# Patient Record
Sex: Female | Born: 1960 | Race: Black or African American | Hispanic: No | Marital: Married | Smoking: Former smoker
Health system: Southern US, Community
[De-identification: ages and names within clinical notes are randomized; demographics above are authoritative.]

## PROBLEM LIST (undated history)

## (undated) DIAGNOSIS — M1990 Unspecified osteoarthritis, unspecified site: Secondary | ICD-10-CM

## (undated) DIAGNOSIS — C50919 Malignant neoplasm of unspecified site of unspecified female breast: Secondary | ICD-10-CM

## (undated) DIAGNOSIS — E119 Type 2 diabetes mellitus without complications: Secondary | ICD-10-CM

## (undated) DIAGNOSIS — Z923 Personal history of irradiation: Secondary | ICD-10-CM

## (undated) DIAGNOSIS — Z9221 Personal history of antineoplastic chemotherapy: Secondary | ICD-10-CM

## (undated) DIAGNOSIS — G709 Myoneural disorder, unspecified: Secondary | ICD-10-CM

## (undated) DIAGNOSIS — I10 Essential (primary) hypertension: Secondary | ICD-10-CM

## (undated) DIAGNOSIS — F32A Depression, unspecified: Secondary | ICD-10-CM

## (undated) DIAGNOSIS — J189 Pneumonia, unspecified organism: Secondary | ICD-10-CM

## (undated) DIAGNOSIS — F419 Anxiety disorder, unspecified: Secondary | ICD-10-CM

---

## 2003-12-19 ENCOUNTER — Emergency Department (HOSPITAL_COMMUNITY): Admission: EM | Admit: 2003-12-19 | Discharge: 2003-12-19 | Payer: Self-pay | Admitting: Emergency Medicine

## 2009-09-07 ENCOUNTER — Emergency Department (HOSPITAL_COMMUNITY): Admission: EM | Admit: 2009-09-07 | Discharge: 2009-09-07 | Payer: Self-pay | Admitting: Family Medicine

## 2011-07-31 ENCOUNTER — Emergency Department (HOSPITAL_COMMUNITY)
Admission: EM | Admit: 2011-07-31 | Discharge: 2011-07-31 | Disposition: A | Discharge status: Home / Self Care | Payer: 59 | Attending: Emergency Medicine | Admitting: Emergency Medicine

## 2011-07-31 DIAGNOSIS — J3489 Other specified disorders of nose and nasal sinuses: Secondary | ICD-10-CM | POA: Insufficient documentation

## 2011-07-31 DIAGNOSIS — Z79899 Other long term (current) drug therapy: Secondary | ICD-10-CM | POA: Insufficient documentation

## 2011-07-31 DIAGNOSIS — R112 Nausea with vomiting, unspecified: Secondary | ICD-10-CM | POA: Insufficient documentation

## 2011-07-31 DIAGNOSIS — I10 Essential (primary) hypertension: Secondary | ICD-10-CM | POA: Insufficient documentation

## 2011-07-31 DIAGNOSIS — J309 Allergic rhinitis, unspecified: Secondary | ICD-10-CM | POA: Insufficient documentation

## 2011-07-31 DIAGNOSIS — R42 Dizziness and giddiness: Secondary | ICD-10-CM | POA: Insufficient documentation

## 2011-07-31 DIAGNOSIS — E119 Type 2 diabetes mellitus without complications: Secondary | ICD-10-CM | POA: Insufficient documentation

## 2011-07-31 LAB — GLUCOSE, CAPILLARY: Glucose-Capillary: 197 mg/dL — ABNORMAL HIGH (ref 70–99)

## 2015-06-20 ENCOUNTER — Encounter (HOSPITAL_COMMUNITY): Payer: Self-pay | Admitting: Emergency Medicine

## 2015-06-20 ENCOUNTER — Emergency Department (HOSPITAL_COMMUNITY)
Admission: EM | Admit: 2015-06-20 | Discharge: 2015-06-20 | Disposition: A | Discharge status: Home / Self Care | Payer: Managed Care, Other (non HMO) | Attending: Emergency Medicine | Admitting: Emergency Medicine

## 2015-06-20 DIAGNOSIS — Z8659 Personal history of other mental and behavioral disorders: Secondary | ICD-10-CM | POA: Diagnosis not present

## 2015-06-20 DIAGNOSIS — H9201 Otalgia, right ear: Secondary | ICD-10-CM

## 2015-06-20 DIAGNOSIS — E119 Type 2 diabetes mellitus without complications: Secondary | ICD-10-CM | POA: Diagnosis not present

## 2015-06-20 DIAGNOSIS — I10 Essential (primary) hypertension: Secondary | ICD-10-CM | POA: Diagnosis not present

## 2015-06-20 DIAGNOSIS — R42 Dizziness and giddiness: Secondary | ICD-10-CM

## 2015-06-20 DIAGNOSIS — Z720 Tobacco use: Secondary | ICD-10-CM | POA: Diagnosis not present

## 2015-06-20 HISTORY — DX: Anxiety disorder, unspecified: F41.9

## 2015-06-20 HISTORY — DX: Type 2 diabetes mellitus without complications: E11.9

## 2015-06-20 HISTORY — DX: Essential (primary) hypertension: I10

## 2015-06-20 MED ORDER — PSEUDOEPHEDRINE HCL 30 MG PO TABS: 30.0000 mg | ORAL_TABLET | Freq: Four times a day (QID) | ORAL | Status: DC | PRN

## 2015-06-20 MED ORDER — MECLIZINE HCL 12.5 MG PO TABS
25.0000 mg | ORAL_TABLET | Freq: Once | ORAL | Status: AC
  Administered 2015-06-20: 25 mg via ORAL
  Filled 2015-06-20: qty 2

## 2015-06-20 MED ORDER — PSEUDOEPHEDRINE HCL 60 MG PO TABS
30.0000 mg | ORAL_TABLET | Freq: Once | ORAL | Status: AC
  Administered 2015-06-20: 60 mg via ORAL
  Filled 2015-06-20: qty 1

## 2015-06-20 MED ORDER — MECLIZINE HCL 25 MG PO TABS: 25.0000 mg | ORAL_TABLET | Freq: Three times a day (TID) | ORAL | Status: DC | PRN

## 2015-06-20 MED ORDER — AMOXICILLIN 250 MG PO CAPS: 250.0000 mg | ORAL_CAPSULE | Freq: Three times a day (TID) | ORAL | Status: DC

## 2015-06-20 NOTE — ED Notes (Signed)
Pt c/o pain to right ear since yesterday am.

## 2015-06-20 NOTE — ED Provider Notes (Signed)
CSN: [id number]     Arrival date & time 06/20/15  1721 History   First MD Initiated Contact with Patient 06/20/15 1730     Chief Complaint  Patient presents with  . Otalgia     (Consider location/radiation/quality/duration/timing/severity/associated sxs/prior Treatment) Patient is a 54 y.o. female presenting with ear pain. The history is provided by the patient.  Otalgia Location:  Right Quality:  Unable to specify Severity:  Moderate Onset quality:  Sudden Duration:  1 day Timing:  Constant Progression:  Worsening Chronicity:  New Relieved by:  None tried Worsened by:  Nothing tried Ineffective treatments:  None tried  Sydney Lee is a 54 y.o. female who presents to the ED with right ear pain that started yesterday. She describes the symptoms as a feeling that wind is blowing in her ear. She reports using a ear phone all day at work in the right ear. Hx of vertigo and has had some episodes recently. She takes Claritin daily.   Past Medical History  Diagnosis Date  . Hypertension   . Diabetes mellitus without complication   . Anxiety    History reviewed. No pertinent past surgical history. No family history on file. History  Substance Use Topics  . Smoking status: Current Every Day Smoker -- 1.00 packs/day    Types: Cigarettes  . Smokeless tobacco: Not on file  . Alcohol Use: No   OB History    No data available     Review of Systems Negative except as stated in HPI   Allergies  Codeine  Home Medications   Prior to Admission medications   Medication Sig Start Date End Date Taking? Authorizing Provider  amoxicillin (AMOXIL) 250 MG capsule Take 1 capsule (250 mg total) by mouth 3 (three) times daily. 06/20/15   Hope Bunnie Pion, NP  meclizine (ANTIVERT) 25 MG tablet Take 1 tablet (25 mg total) by mouth 3 (three) times daily as needed for dizziness. 06/20/15   Hope Bunnie Pion, NP  pseudoephedrine (SUDAFED) 30 MG tablet Take 1 tablet (30 mg total) by mouth every 6  (six) hours as needed for congestion. 06/20/15   Hope Bunnie Pion, NP   BP 112/71 mmHg  Pulse 82  Temp(Src) 98.2 F (36.8 C) (Oral)  Resp 22  Ht 5\' 9"  (1.753 m)  Wt 247 lb (112.038 kg)  BMI 36.46 kg/m2  SpO2 97% Physical Exam  Constitutional: She is oriented to person, place, and time. She appears well-developed and well-nourished. No distress.  HENT:  Head: Normocephalic and atraumatic.  Right Ear: Tympanic membrane normal.  Left Ear: Tympanic membrane normal.  Nose: Nose normal.  Mouth/Throat: Uvula is midline, oropharynx is clear and moist and mucous membranes are normal.  Eyes: Conjunctivae and EOM are normal.  Neck: Normal range of motion. Neck supple.  Cardiovascular: Normal rate.   Pulmonary/Chest: Effort normal.  Musculoskeletal: Normal range of motion.  Neurological: She is alert and oriented to person, place, and time. She has normal strength. No cranial nerve deficit. Gait normal.  Skin: Skin is warm and dry.  Psychiatric: She has a normal mood and affect. Her behavior is normal.  Nursing note and vitals reviewed.   ED Course  Procedures (including critical care time) Dr. Lacinda Axon in to examine the patient. He discussed with her eustachian tube dysfunction and possible early infection that is not visible at this time. Will start antibiotics, decongestants and Antivert. She will follow up with her doctor next week.    MDM  54 y.o.  female with right ear pain x 1 day. Stable for d/c without fever, no mastoid tenderness, no focal neuro deficits. Discussed with the patient clinical findings and plan of care. All questioned fully answered. She will follow up with her PCP or return here if any problems arise.   Final diagnoses:  Ear ache, right  Vertigo       Ashley Murrain, NP 06/20/15 7622  Nat Christen, MD 06/20/15 [phone]

## 2015-10-12 ENCOUNTER — Other Ambulatory Visit (HOSPITAL_COMMUNITY)
Admission: RE | Admit: 2015-10-12 | Discharge: 2015-10-12 | Disposition: A | Discharge status: Home / Self Care | Payer: Managed Care, Other (non HMO) | Source: Ambulatory Visit | Attending: Family Medicine | Admitting: Family Medicine

## 2015-10-12 DIAGNOSIS — E1165 Type 2 diabetes mellitus with hyperglycemia: Secondary | ICD-10-CM | POA: Diagnosis not present

## 2015-10-12 DIAGNOSIS — I10 Essential (primary) hypertension: Secondary | ICD-10-CM | POA: Insufficient documentation

## 2015-10-12 LAB — COMPREHENSIVE METABOLIC PANEL
ALT: 12 U/L — AB (ref 14–54)
AST: 15 U/L (ref 15–41)
Albumin: 3.9 g/dL (ref 3.5–5.0)
Alkaline Phosphatase: 66 U/L (ref 38–126)
Anion gap: 9 (ref 5–15)
BUN: 13 mg/dL (ref 6–20)
CHLORIDE: 102 mmol/L (ref 101–111)
CO2: 30 mmol/L (ref 22–32)
CREATININE: 0.86 mg/dL (ref 0.44–1.00)
Calcium: 9.6 mg/dL (ref 8.9–10.3)
GFR calc Af Amer: >60 mL/min (ref >60)
Glucose, Bld: 128 mg/dL — ABNORMAL HIGH (ref 65–99)
POTASSIUM: 3.4 mmol/L — AB (ref 3.5–5.1)
SODIUM: 141 mmol/L (ref 135–145)
Total Bilirubin: 0.7 mg/dL (ref 0.3–1.2)
Total Protein: 7.3 g/dL (ref 6.5–8.1)

## 2015-10-13 LAB — HEMOGLOBIN A1C
Hgb A1c MFr Bld: 7.4 % — ABNORMAL HIGH (ref 4.8–5.6)
MEAN PLASMA GLUCOSE: 166 mg/dL

## 2016-08-12 ENCOUNTER — Encounter (HOSPITAL_COMMUNITY): Payer: Self-pay

## 2016-08-12 ENCOUNTER — Emergency Department (HOSPITAL_COMMUNITY)
Admission: EM | Admit: 2016-08-12 | Discharge: 2016-08-12 | Disposition: A | Discharge status: Home / Self Care | Payer: Managed Care, Other (non HMO) | Attending: Dermatology | Admitting: Dermatology

## 2016-08-12 DIAGNOSIS — Z79899 Other long term (current) drug therapy: Secondary | ICD-10-CM | POA: Diagnosis not present

## 2016-08-12 DIAGNOSIS — Z5321 Procedure and treatment not carried out due to patient leaving prior to being seen by health care provider: Secondary | ICD-10-CM | POA: Insufficient documentation

## 2016-08-12 DIAGNOSIS — I10 Essential (primary) hypertension: Secondary | ICD-10-CM | POA: Insufficient documentation

## 2016-08-12 DIAGNOSIS — E119 Type 2 diabetes mellitus without complications: Secondary | ICD-10-CM | POA: Diagnosis not present

## 2016-08-12 DIAGNOSIS — F17210 Nicotine dependence, cigarettes, uncomplicated: Secondary | ICD-10-CM | POA: Diagnosis not present

## 2016-08-12 NOTE — ED Triage Notes (Signed)
Patient states she wants to go home, patient states her blood pressure is not as high as it was at home, and states "ill take my medication at home" patient ambulatory without deficit out of department at this time

## 2016-08-12 NOTE — ED Triage Notes (Signed)
Patient states she checked her blood pressure at home and it was elevated. Denies any other symptoms. States she has not had her blood pressure in two days. A&OX4 at this time

## 2017-03-08 ENCOUNTER — Emergency Department (HOSPITAL_COMMUNITY): Payer: BLUE CROSS/BLUE SHIELD

## 2017-03-08 ENCOUNTER — Encounter (HOSPITAL_COMMUNITY): Payer: Self-pay | Admitting: *Deleted

## 2017-03-08 ENCOUNTER — Emergency Department (HOSPITAL_COMMUNITY)
Admission: EM | Admit: 2017-03-08 | Discharge: 2017-03-08 | Disposition: A | Discharge status: Home / Self Care | Payer: BLUE CROSS/BLUE SHIELD | Attending: Emergency Medicine | Admitting: Emergency Medicine

## 2017-03-08 DIAGNOSIS — F17210 Nicotine dependence, cigarettes, uncomplicated: Secondary | ICD-10-CM | POA: Insufficient documentation

## 2017-03-08 DIAGNOSIS — B9789 Other viral agents as the cause of diseases classified elsewhere: Secondary | ICD-10-CM

## 2017-03-08 DIAGNOSIS — E119 Type 2 diabetes mellitus without complications: Secondary | ICD-10-CM | POA: Diagnosis not present

## 2017-03-08 DIAGNOSIS — J069 Acute upper respiratory infection, unspecified: Secondary | ICD-10-CM | POA: Diagnosis not present

## 2017-03-08 DIAGNOSIS — Z79899 Other long term (current) drug therapy: Secondary | ICD-10-CM | POA: Insufficient documentation

## 2017-03-08 DIAGNOSIS — R05 Cough: Secondary | ICD-10-CM | POA: Diagnosis present

## 2017-03-08 DIAGNOSIS — M65311 Trigger thumb, right thumb: Secondary | ICD-10-CM | POA: Diagnosis not present

## 2017-03-08 DIAGNOSIS — I10 Essential (primary) hypertension: Secondary | ICD-10-CM | POA: Diagnosis not present

## 2017-03-08 MED ORDER — BENZONATATE 200 MG PO CAPS: 200.0000 mg | ORAL_CAPSULE | Freq: Three times a day (TID) | ORAL | 0 refills | Status: DC | PRN

## 2017-03-08 MED ORDER — ALBUTEROL SULFATE HFA 108 (90 BASE) MCG/ACT IN AERS
2.0000 | INHALATION_SPRAY | Freq: Once | RESPIRATORY_TRACT | Status: AC
  Administered 2017-03-08: 2 via RESPIRATORY_TRACT
  Filled 2017-03-08: qty 6.7

## 2017-03-08 NOTE — ED Triage Notes (Signed)
Pt comes in with multiple problems. Pt has a cough that has lasted 2 weeks. Pt states it was productive at first but is now non-productive. No breathing problems noted. Pt is having right thumb pain that is intermittent for 1 month. Pt denies any injury

## 2017-03-08 NOTE — Discharge Instructions (Signed)
1-2 puffs of the albuterol every 4-6 hrs as needed.  Wear the brace for at least one week.  Call Dr. Ruthe Mannan office to arrange  follow-up appt if not improving

## 2017-03-12 NOTE — ED Provider Notes (Addendum)
[facility] DEPT Provider Note   CSN: [id number] Arrival date & time: 03/08/17  1031     History   Chief Complaint Chief Complaint  Patient presents with  . Cough  . thumb pain    HPI CIEARRA RUFO is a 56 y.o. female.  HPI   KHYLI SWAIM is a 56 y.o. female who presents to the Emergency Department complaining of cough for 2 weeks.  Cough has been productive, but now mostly dry.  She states that she feels her symptoms are improving somewhat.  Denies shortness of breath, chest pain and fever.  She also reports pain to the right thumb for one month.  Notes having a "clicking" to the distal thumb.  Denies injury, numbness or swelling.  Thumb painful with movement.  No skin changes.    Past Medical History:  Diagnosis Date  . Anxiety   . Diabetes mellitus without complication ([place])   . Hypertension     There are no active problems to display for this patient.   History reviewed. No pertinent surgical history.  OB History    No data available       Home Medications    Prior to Admission medications   Medication Sig Start Date End Date Taking? Authorizing Provider  amoxicillin (AMOXIL) 250 MG capsule Take 1 capsule (250 mg total) by mouth 3 (three) times daily. 06/20/15   Hope Bunnie Pion, NP  benzonatate (TESSALON) 200 MG capsule Take 1 capsule (200 mg total) by mouth 3 (three) times daily as needed for cough. Swallow whole, do not chew 03/08/17   Efstathios Sawin, PA-C  meclizine (ANTIVERT) 25 MG tablet Take 1 tablet (25 mg total) by mouth 3 (three) times daily as needed for dizziness. 06/20/15   Hope Bunnie Pion, NP  pseudoephedrine (SUDAFED) 30 MG tablet Take 1 tablet (30 mg total) by mouth every 6 (six) hours as needed for congestion. 06/20/15   Hope Bunnie Pion, NP    Family History No family history on file.  Social History Social History  Substance Use Topics  . Smoking status: Current Every Day Smoker    Packs/day: 1.00    Types: Cigarettes  . Smokeless tobacco:  Never Used  . Alcohol use No     Allergies   Codeine   Review of Systems Review of Systems  Constitutional: Negative for appetite change, chills and fever.  HENT: Positive for congestion. Negative for sore throat and trouble swallowing.   Respiratory: Positive for cough. Negative for chest tightness, shortness of breath and wheezing.   Cardiovascular: Negative for chest pain.  Gastrointestinal: Negative for abdominal pain, nausea and vomiting.  Genitourinary: Negative for dysuria.  Musculoskeletal: Negative for arthralgias (right thumb pain).  Skin: Negative for rash.  Neurological: Negative for dizziness, weakness and numbness.  Hematological: Negative for adenopathy.  All other systems reviewed and are negative.    Physical Exam Updated Vital Signs BP 127/79 (BP Location: Right Arm)   Pulse 82   Temp 98.1 F (36.7 C) (Oral)   Resp 18   Ht 5\' 9"  (1.753 m)   Wt 107.5 kg   SpO2 97%   BMI 35.00 kg/m   Physical Exam  Constitutional: She is oriented to person, place, and time. She appears well-developed and well-nourished. No distress.  HENT:  Head: Normocephalic and atraumatic.  Right Ear: Tympanic membrane and ear canal normal.  Left Ear: Tympanic membrane and ear canal normal.  Mouth/Throat: Uvula is midline, oropharynx is clear and moist and mucous  membranes are normal. No oropharyngeal exudate.  Eyes: EOM are normal. Pupils are equal, round, and reactive to light.  Neck: Normal range of motion, full passive range of motion without pain and phonation normal. Neck supple.  Cardiovascular: Normal rate, regular rhythm and intact distal pulses.   No murmur heard. Pulmonary/Chest: Effort normal and breath sounds normal. No stridor. No respiratory distress. She has no wheezes. She has no rales. She exhibits no tenderness.  Musculoskeletal: She exhibits tenderness. She exhibits no edema or deformity.  ttp of the right distal thumb, with trigger thumb.  No edema.  Pt has  full ROM of the digit  Lymphadenopathy:    She has no cervical adenopathy.  Neurological: She is alert and oriented to person, place, and time. No sensory deficit. She exhibits normal muscle tone. Coordination normal.  Skin: Skin is warm and dry. Capillary refill takes less than 2 seconds.  Nursing note and vitals reviewed.    ED Treatments / Results  Labs (all labs ordered are listed, but only abnormal results are displayed) Labs Reviewed - No data to display  EKG  EKG Interpretation None       Radiology Dg Chest 2 View  Result Date: 03/08/2017 CLINICAL DATA:  Cough.  Congestion. EXAM: CHEST  2 VIEW COMPARISON:  None. FINDINGS: Normal heart size. Normal mediastinal contour. No pneumothorax. No pleural effusion. Lungs appear clear, with no acute consolidative airspace disease and no pulmonary edema. IMPRESSION: No active cardiopulmonary disease. Electronically Signed   By: Ilona Sorrel M.D.   On: 03/08/2017 11:42   Dg Finger Thumb Right  Result Date: 03/08/2017 CLINICAL DATA:  Pain and swelling over the last month. Symptoms worsening. EXAM: RIGHT THUMB 2+V COMPARISON:  None. FINDINGS: There is no evidence of fracture or dislocation. There is no evidence of arthropathy or other focal bone abnormality. Soft tissues are unremarkable IMPRESSION: Negative. Electronically Signed   By: Nelson Chimes M.D.   On: 03/08/2017 11:14     Procedures Procedures (including critical care time)  SPLINT APPLICATION Date/Time: 7:78 PM Authorized by: Hale Bogus. Consent: Verbal consent obtained. Risks and benefits: risks, benefits and alternatives were discussed Consent given by: patient Splint applied by: nursing Location details: right thumb Splint type: velcro thumb spica Supplies used: pre-made splint Post-procedure: The splinted body part was neurovascularly unchanged following the procedure. Patient tolerance: Patient tolerated the procedure well with no immediate  complications.     Medications Ordered in ED Medications  albuterol (PROVENTIL HFA;VENTOLIN HFA) 108 (90 Base) MCG/ACT inhaler 2 puff (2 puffs Inhalation Given 03/08/17 1217)     Initial Impression / Assessment and Plan / ED Course  I have reviewed the triage vital signs and the nursing notes.  Pertinent labs & imaging results that were available during my care of the patient were reviewed by me and considered in my medical decision making (see chart for details).    Pt with trigger thumb of the right thumb.  No concerning sx's for septic joint.  NV intact.  No skin changes.    Thumb spica applied.  Pt agrees to NSAID and orthopedic f/u  Cough likely viral.  No respiratory distress.  Albuterol MDI dispensed.    Final Clinical Impressions(s) / ED Diagnoses   Final diagnoses:  Viral URI with cough  Trigger thumb of right hand    New Prescriptions Discharge Medication List as of 03/08/2017 12:27 PM       Gustava Berland Vanessa [place], PA-C 03/12/17 2308    Milton Ferguson, MD  03/12/17 [place], PA-C 03/20/17 2040    Milton Ferguson, MD 03/28/17 2141

## 2018-06-14 ENCOUNTER — Encounter (HOSPITAL_COMMUNITY): Payer: Self-pay | Admitting: Emergency Medicine

## 2018-06-14 ENCOUNTER — Emergency Department (HOSPITAL_COMMUNITY): Payer: BLUE CROSS/BLUE SHIELD

## 2018-06-14 ENCOUNTER — Other Ambulatory Visit: Payer: Self-pay

## 2018-06-14 ENCOUNTER — Emergency Department (HOSPITAL_COMMUNITY)
Admission: EM | Admit: 2018-06-14 | Discharge: 2018-06-14 | Disposition: A | Discharge status: Home / Self Care | Payer: BLUE CROSS/BLUE SHIELD | Attending: Emergency Medicine | Admitting: Emergency Medicine

## 2018-06-14 DIAGNOSIS — E119 Type 2 diabetes mellitus without complications: Secondary | ICD-10-CM | POA: Diagnosis not present

## 2018-06-14 DIAGNOSIS — I10 Essential (primary) hypertension: Secondary | ICD-10-CM | POA: Insufficient documentation

## 2018-06-14 DIAGNOSIS — R0789 Other chest pain: Secondary | ICD-10-CM | POA: Diagnosis not present

## 2018-06-14 DIAGNOSIS — F17210 Nicotine dependence, cigarettes, uncomplicated: Secondary | ICD-10-CM | POA: Diagnosis not present

## 2018-06-14 DIAGNOSIS — R079 Chest pain, unspecified: Secondary | ICD-10-CM | POA: Diagnosis present

## 2018-06-14 LAB — BASIC METABOLIC PANEL
Anion gap: 10 (ref 5–15)
BUN: 11 mg/dL (ref 6–20)
CALCIUM: 9.2 mg/dL (ref 8.9–10.3)
CO2: 27 mmol/L (ref 22–32)
CREATININE: 0.97 mg/dL (ref 0.44–1.00)
Chloride: 102 mmol/L (ref 98–111)
Glucose, Bld: 194 mg/dL — ABNORMAL HIGH (ref 70–99)
Potassium: 3.3 mmol/L — ABNORMAL LOW (ref 3.5–5.1)
Sodium: 139 mmol/L (ref 135–145)

## 2018-06-14 LAB — CBC
HCT: 38.4 % (ref 36.0–46.0)
Hemoglobin: 12.3 g/dL (ref 12.0–15.0)
MCH: 29.4 pg (ref 26.0–34.0)
MCHC: 32 g/dL (ref 30.0–36.0)
MCV: 91.6 fL (ref 78.0–100.0)
PLATELETS: 290 10*3/uL (ref 150–400)
RBC: 4.19 MIL/uL (ref 3.87–5.11)
RDW: 12.8 % (ref 11.5–15.5)
WBC: 6.1 10*3/uL (ref 4.0–10.5)

## 2018-06-14 LAB — I-STAT BETA HCG BLOOD, ED (MC, WL, AP ONLY)

## 2018-06-14 LAB — I-STAT TROPONIN, ED
TROPONIN I, POC: 0 ng/mL (ref 0.00–0.08)
Troponin i, poc: 0 ng/mL (ref 0.00–0.08)

## 2018-06-14 MED ORDER — POTASSIUM CHLORIDE CRYS ER 20 MEQ PO TBCR
20.0000 meq | EXTENDED_RELEASE_TABLET | Freq: Once | ORAL | Status: AC
  Administered 2018-06-14: 20 meq via ORAL
  Filled 2018-06-14: qty 1

## 2018-06-14 NOTE — ED Provider Notes (Signed)
[facility] EMERGENCY DEPARTMENT Provider Note   CSN: [id number] Arrival date & time: 06/14/18  [place]     History   Chief Complaint Chief Complaint  Patient presents with  . Chest Pain    HPI Sydney Lee is a 57 y.o. female hx of DM, HTN, anxiety, here presenting with left-sided chest pain.  Patient states that she had left-sided chest pain that started around 8 AM that is intermittent.  She went to work and came back home and had recurrent pain around 4 PM.  Patient states that she took her Xanax and now is pain-free.  Patient states that she has no heart problems and has no recent travel.  Patient has no history of blood clots.   The history is provided by the patient.    Past Medical History:  Diagnosis Date  . Anxiety   . Diabetes mellitus without complication ([place])   . Hypertension     There are no active problems to display for this patient.   History reviewed. No pertinent surgical history.   OB History   None      Home Medications    Prior to Admission medications   Medication Sig Start Date End Date Taking? Authorizing Provider  amoxicillin (AMOXIL) 250 MG capsule Take 1 capsule (250 mg total) by mouth 3 (three) times daily. 06/20/15   Ashley Murrain, NP  benzonatate (TESSALON) 200 MG capsule Take 1 capsule (200 mg total) by mouth 3 (three) times daily as needed for cough. Swallow whole, do not chew 03/08/17   Triplett, Tammy, PA-C  meclizine (ANTIVERT) 25 MG tablet Take 1 tablet (25 mg total) by mouth 3 (three) times daily as needed for dizziness. 06/20/15   Ashley Murrain, NP  pseudoephedrine (SUDAFED) 30 MG tablet Take 1 tablet (30 mg total) by mouth every 6 (six) hours as needed for congestion. 06/20/15   Ashley Murrain, NP    Family History No family history on file.  Social History Social History   Tobacco Use  . Smoking status: Current Every Day Smoker    Packs/day: 1.00    Types: Cigarettes  . Smokeless tobacco: Never Used    Substance Use Topics  . Alcohol use: No  . Drug use: No     Allergies   Codeine   Review of Systems Review of Systems  Cardiovascular: Positive for chest pain.  All other systems reviewed and are negative.    Physical Exam Updated Vital Signs BP 117/72 (BP Location: Right Arm)   Pulse 72   Temp 98.5 F (36.9 C) (Oral)   Resp 16   Ht 5' 9.25" (1.759 m)   Wt 103.4 kg (228 lb)   SpO2 100%   BMI 33.43 kg/m   Physical Exam  Constitutional: She is oriented to person, place, and time. She appears well-developed and well-nourished.  HENT:  Head: Normocephalic.  Eyes: Pupils are equal, round, and reactive to light. EOM are normal.  Neck: Normal range of motion.  Cardiovascular: Normal rate and regular rhythm.  Pulmonary/Chest: Effort normal and breath sounds normal.  Abdominal: Soft. Bowel sounds are normal.  Musculoskeletal: Normal range of motion.       Right lower leg: Normal.       Left lower leg: Normal.  Neurological: She is alert and oriented to person, place, and time.  Skin: Skin is warm. Capillary refill takes less than 2 seconds.  Psychiatric: She has a normal mood and affect. Her behavior is  normal.  Nursing note and vitals reviewed.    ED Treatments / Results  Labs (all labs ordered are listed, but only abnormal results are displayed) Labs Reviewed  BASIC METABOLIC PANEL - Abnormal; Notable for the following components:      Result Value   Potassium 3.3 (*)    Glucose, Bld 194 (*)    All other components within normal limits  CBC  I-STAT TROPONIN, ED  I-STAT BETA HCG BLOOD, ED ([facility], [facility], [facility] ONLY)  I-STAT TROPONIN, ED    EKG EKG Interpretation  Date/Time:  Thursday June 14 2018 17:50:38 EDT Ventricular Rate:  78 PR Interval:  150 QRS Duration: 92 QT Interval:  352 QTC Calculation: 401 R Axis:   -15 Text Interpretation:  Normal sinus rhythm Low voltage QRS Cannot rule out Anterior infarct , age undetermined Abnormal ECG No previous ECGs  available Confirmed by Wandra Arthurs ([id number]) on 06/14/2018 7:47:06 PM   Radiology Dg Chest 2 View  Result Date: 06/14/2018 CLINICAL DATA:  Chest pain EXAM: CHEST - 2 VIEW COMPARISON:  03/08/2017 FINDINGS: Normal heart size and mediastinal contours. Interstitial coarsening that is chronic based on prior. There is no edema, consolidation, effusion, or pneumothorax. Spondylosis. IMPRESSION: No acute finding when compared to prior. Electronically Signed   By: Monte Fantasia M.D.   On: 06/14/2018 19:18    Procedures Procedures (including critical care time)  Medications Ordered in ED Medications  potassium chloride SA (K-DUR,KLOR-CON) CR tablet 20 mEq (has no administration in time range)     Initial Impression / Assessment and Plan / ED Course  I have reviewed the triage vital signs and the nursing notes.  Pertinent labs & imaging results that were available during my care of the patient were reviewed by me and considered in my medical decision making (see chart for details).     Sydney Lee is a 57 y.o. female here presenting with chest pain. Patient is low risk for ACS and I doubt PE or dissection. Likely anxiety. Will get labs, trop x 2, CXR. Pain free currently.   9:48 PM Labs and delta trop neg. CXR clear. Stable for discharge.    Final Clinical Impressions(s) / ED Diagnoses   Final diagnoses:  None    ED Discharge Orders    None       Drenda Freeze, MD 06/14/18 2148

## 2018-06-14 NOTE — ED Provider Notes (Signed)
Patient placed in Quick Look pathway, seen and evaluated   Chief Complaint: chest pain   HPI:   Patient reorts that she has a sharp, stabbing pain in her left chest.  She reports that it was present then went away gradually after it started at 8am.  She reports that she is currently chest pain free.  She reports that she deals with anxiety and took her medications and that didn't help.  No fevers.    ROS: No fevers (one)  Physical Exam:   Gen: No distress  Neuro: Awake and Alert  Skin: Warm    Focused Exam: RRR, no MGR.  Chest wall is non ttp.     Initiation of care has begun. The patient has been counseled on the process, plan, and necessity for staying for the completion/evaluation, and the remainder of the medical screening examination    Ollen Gross 06/14/18 1801    Drenda Freeze, MD 06/14/18 [phone]

## 2018-06-14 NOTE — ED Triage Notes (Signed)
Pt reports chest pain to left upper chest described as a sharp stabbing pain that comes intermittently. Denies pain at this time and states she has a hx of anxiety and that her pain resolved with her alprazolam.

## 2018-06-14 NOTE — Discharge Instructions (Signed)
Continue your current meds.   If you have persistent pain, consider getting a stress test with your doctor  Return to ER if you have worse chest pain, trouble breathing, palpitations

## 2018-12-22 ENCOUNTER — Emergency Department (HOSPITAL_COMMUNITY)
Admission: EM | Admit: 2018-12-22 | Discharge: 2018-12-23 | Disposition: A | Discharge status: Home / Self Care | Payer: BLUE CROSS/BLUE SHIELD | Attending: Emergency Medicine | Admitting: Emergency Medicine

## 2018-12-22 ENCOUNTER — Emergency Department (HOSPITAL_COMMUNITY): Payer: BLUE CROSS/BLUE SHIELD

## 2018-12-22 ENCOUNTER — Other Ambulatory Visit: Payer: Self-pay

## 2018-12-22 DIAGNOSIS — R0789 Other chest pain: Secondary | ICD-10-CM | POA: Diagnosis not present

## 2018-12-22 DIAGNOSIS — F17210 Nicotine dependence, cigarettes, uncomplicated: Secondary | ICD-10-CM | POA: Diagnosis not present

## 2018-12-22 DIAGNOSIS — I10 Essential (primary) hypertension: Secondary | ICD-10-CM | POA: Diagnosis not present

## 2018-12-22 DIAGNOSIS — E119 Type 2 diabetes mellitus without complications: Secondary | ICD-10-CM | POA: Diagnosis not present

## 2018-12-22 DIAGNOSIS — Z79899 Other long term (current) drug therapy: Secondary | ICD-10-CM | POA: Insufficient documentation

## 2018-12-22 LAB — BASIC METABOLIC PANEL
Anion gap: 12 (ref 5–15)
BUN: 10 mg/dL (ref 6–20)
CO2: 23 mmol/L (ref 22–32)
CREATININE: 0.9 mg/dL (ref 0.44–1.00)
Calcium: 8.9 mg/dL (ref 8.9–10.3)
Chloride: 100 mmol/L (ref 98–111)
GFR calc Af Amer: >60 mL/min (ref >60)
Glucose, Bld: 266 mg/dL — ABNORMAL HIGH (ref 70–99)
Potassium: 3.7 mmol/L (ref 3.5–5.1)
SODIUM: 135 mmol/L (ref 135–145)

## 2018-12-22 LAB — CBC WITH DIFFERENTIAL/PLATELET
Abs Immature Granulocytes: 0.01 10*3/uL (ref 0.00–0.07)
BASOS PCT: 1 %
Basophils Absolute: 0 10*3/uL (ref 0.0–0.1)
EOS ABS: 0.2 10*3/uL (ref 0.0–0.5)
EOS PCT: 4 %
HCT: 38.5 % (ref 36.0–46.0)
Hemoglobin: 12.2 g/dL (ref 12.0–15.0)
Immature Granulocytes: 0 %
Lymphocytes Relative: 51 %
Lymphs Abs: 2.5 10*3/uL (ref 0.7–4.0)
MCH: 28.8 pg (ref 26.0–34.0)
MCHC: 31.7 g/dL (ref 30.0–36.0)
MCV: 90.8 fL (ref 80.0–100.0)
MONO ABS: 0.3 10*3/uL (ref 0.1–1.0)
Monocytes Relative: 6 %
Neutro Abs: 1.8 10*3/uL (ref 1.7–7.7)
Neutrophils Relative %: 38 %
PLATELETS: 274 10*3/uL (ref 150–400)
RBC: 4.24 MIL/uL (ref 3.87–5.11)
RDW: 13 % (ref 11.5–15.5)
WBC: 4.8 10*3/uL (ref 4.0–10.5)
nRBC: 0 % (ref 0.0–0.2)

## 2018-12-22 LAB — I-STAT TROPONIN, ED: Troponin i, poc: 0 ng/mL (ref 0.00–0.08)

## 2018-12-22 NOTE — ED Triage Notes (Signed)
Pt reports left sided chest pain that has gotten worse over the past two hours.  Reports on cardiac hx but has had this pain in the past.

## 2018-12-22 NOTE — ED Notes (Signed)
Patient transported to X-ray 

## 2018-12-22 NOTE — ED Provider Notes (Signed)
[facility] EMERGENCY DEPARTMENT Provider Note   CSN: [id number] Arrival date & time: 12/22/18  2051     History   Chief Complaint Chief Complaint  Patient presents with  . Chest Pain    HPI Sydney Lee is a 58 y.o. female.  Patient is a 58 year old female who presents with chest pain.  She states that about 2 hours ago she started having some sharp pains in the left side of her chest.  She states it is a shooting pain and only last 1 to 2 seconds and then goes away.  It is been intermittent for the last 2 hours but she currently denies any pain.  No pleuritic pain.  No shortness of breath.  No nausea or vomiting.  No dizziness.  No diaphoresis.  She does have a history of diabetes and hypertension but denies any prior history of heart disease.  No leg pain or swelling.  She did have some URI symptoms recently but states that those have improved.     Past Medical History:  Diagnosis Date  . Anxiety   . Diabetes mellitus without complication ([place])   . Hypertension     There are no active problems to display for this patient.   No past surgical history on file.   OB History   No obstetric history on file.      Home Medications    Prior to Admission medications   Medication Sig Start Date End Date Taking? Authorizing Provider  amoxicillin (AMOXIL) 250 MG capsule Take 1 capsule (250 mg total) by mouth 3 (three) times daily. 06/20/15   Ashley Murrain, NP  benzonatate (TESSALON) 200 MG capsule Take 1 capsule (200 mg total) by mouth 3 (three) times daily as needed for cough. Swallow whole, do not chew 03/08/17   Triplett, Tammy, PA-C  meclizine (ANTIVERT) 25 MG tablet Take 1 tablet (25 mg total) by mouth 3 (three) times daily as needed for dizziness. 06/20/15   Ashley Murrain, NP  pseudoephedrine (SUDAFED) 30 MG tablet Take 1 tablet (30 mg total) by mouth every 6 (six) hours as needed for congestion. 06/20/15   Ashley Murrain, NP    Family History No family  history on file.  Social History Social History   Tobacco Use  . Smoking status: Current Every Day Smoker    Packs/day: 1.00    Types: Cigarettes  . Smokeless tobacco: Never Used  Substance Use Topics  . Alcohol use: No  . Drug use: No     Allergies   Codeine   Review of Systems Review of Systems  Constitutional: Negative for chills, diaphoresis, fatigue and fever.  HENT: Negative for congestion, rhinorrhea and sneezing.   Eyes: Negative.   Respiratory: Negative for cough, chest tightness and shortness of breath.   Cardiovascular: Positive for chest pain. Negative for leg swelling.  Gastrointestinal: Negative for abdominal pain, blood in stool, diarrhea, nausea and vomiting.  Genitourinary: Negative for difficulty urinating, flank pain, frequency and hematuria.  Musculoskeletal: Negative for arthralgias and back pain.  Skin: Negative for rash.  Neurological: Negative for dizziness, speech difficulty, weakness, numbness and headaches.     Physical Exam Updated Vital Signs BP 105/79   Pulse 80   Temp 98.4 F (36.9 C) (Oral)   Resp (!) 23   Ht 5' 9.25" (1.759 m)   Wt 104.3 kg   SpO2 94%   BMI 33.72 kg/m   Physical Exam Constitutional:      Appearance:  She is well-developed.  HENT:     Head: Normocephalic and atraumatic.  Eyes:     Pupils: Pupils are equal, round, and reactive to light.  Neck:     Musculoskeletal: Normal range of motion and neck supple.  Cardiovascular:     Rate and Rhythm: Normal rate and regular rhythm.     Heart sounds: Normal heart sounds.  Pulmonary:     Effort: Pulmonary effort is normal. No respiratory distress.     Breath sounds: Normal breath sounds. No wheezing or rales.  Chest:     Chest wall: Tenderness (Reproducible tenderness to the left chest wall without crepitus or deformity) present.  Abdominal:     General: Bowel sounds are normal.     Palpations: Abdomen is soft.     Tenderness: There is no abdominal tenderness.  There is no guarding or rebound.  Musculoskeletal: Normal range of motion.     Comments: No edema or calf tenderness  Lymphadenopathy:     Cervical: No cervical adenopathy.  Skin:    General: Skin is warm and dry.     Findings: No rash.  Neurological:     Mental Status: She is alert and oriented to person, place, and time.      ED Treatments / Results  Labs (all labs ordered are listed, but only abnormal results are displayed) Labs Reviewed  BASIC METABOLIC PANEL - Abnormal; Notable for the following components:      Result Value   Glucose, Bld 266 (*)    All other components within normal limits  CBC WITH DIFFERENTIAL/PLATELET  I-STAT TROPONIN, ED    EKG EKG Interpretation  Date/Time:  Saturday December 22 2018 20:57:27 EST Ventricular Rate:  92 PR Interval:  140 QRS Duration: 86 QT Interval:  322 QTC Calculation: 398 R Axis:   -41 Text Interpretation:  Normal sinus rhythm Left axis deviation Low voltage QRS Possible Anterolateral infarct , age undetermined Abnormal ECG since last tracing no significant change Confirmed by Malvin Johns [id number]) on 12/22/2018 10:52:23 PM   Radiology Dg Chest 2 View  Result Date: 12/22/2018 CLINICAL DATA:  Chest pain starting 3 hours ago. EXAM: CHEST - 2 VIEW COMPARISON:  06/14/2018 FINDINGS: Midline trachea. Normal heart size and mediastinal contours. Mild right hemidiaphragm elevation. No pleural effusion or pneumothorax. Diffuse peribronchial thickening. Clear lungs. Moderate thoracic spondylosis. IMPRESSION: 1. No acute cardiopulmonary disease. 2. Mild peribronchial thickening which may relate to chronic bronchitis or smoking. Electronically Signed   By: Abigail Miyamoto M.D.   On: 12/22/2018 22:02    Procedures Procedures (including critical care time)  Medications Ordered in ED Medications - No data to display   Initial Impression / Assessment and Plan / ED Course  I have reviewed the triage vital signs and the nursing  notes.  Pertinent labs & imaging results that were available during my care of the patient were reviewed by me and considered in my medical decision making (see chart for details).    Patient is a 58 year old female who presents with atypical chest pain.  She has fleeting episodes of sharp pain in her left chest that lasts just a few seconds.  She has been chest pain-free since arrival.  Her EKG does not show any ischemic changes.  Her chest x-ray is clear without evidence of pneumonia or pneumothorax.  Her labs are non-concerning.  Her troponin is negative.  She has no other findings that would be more concerning for pulmonary embolus.  Doubt ACS.  Has a  low Heart score of 2.  Will check a second troponin and if neg, will d/c to f/u with her PCP.  Return precautions given.  Dr. Roxanne Mins to f/u on second trop and if negative, will d/c.  Final Clinical Impressions(s) / ED Diagnoses   Final diagnoses:  Atypical chest pain    ED Discharge Orders    None       Malvin Johns, MD 12/22/18 2325

## 2018-12-23 LAB — I-STAT TROPONIN, ED: Troponin i, poc: 0 ng/mL (ref 0.00–0.08)

## 2018-12-23 NOTE — ED Provider Notes (Signed)
Care assumed from Dr. Tamera Punt, patient with chest pain awaiting delta troponin.  Repeat troponin is undetectable.  She is discharged with instructions to follow-up with PCP.  Return precautions discussed.   Delora Fuel, MD 88/82/80 [phone]

## 2019-12-31 ENCOUNTER — Ambulatory Visit: Payer: BC Managed Care – PPO | Attending: Internal Medicine

## 2019-12-31 DIAGNOSIS — Z20822 Contact with and (suspected) exposure to covid-19: Secondary | ICD-10-CM

## 2020-01-01 LAB — NOVEL CORONAVIRUS, NAA: SARS-CoV-2, NAA: NOT DETECTED

## 2020-06-02 ENCOUNTER — Ambulatory Visit
Admission: RE | Admit: 2020-06-02 | Discharge: 2020-06-02 | Disposition: A | Discharge status: Home / Self Care | Payer: BC Managed Care – PPO | Source: Ambulatory Visit | Attending: Family Medicine | Admitting: Family Medicine

## 2020-06-02 ENCOUNTER — Other Ambulatory Visit: Payer: Self-pay | Admitting: Family Medicine

## 2020-06-02 DIAGNOSIS — R52 Pain, unspecified: Secondary | ICD-10-CM

## 2022-03-14 ENCOUNTER — Other Ambulatory Visit: Payer: Self-pay | Admitting: Family Medicine

## 2022-03-25 ENCOUNTER — Other Ambulatory Visit: Payer: Self-pay | Admitting: Family Medicine

## 2022-03-25 DIAGNOSIS — R5381 Other malaise: Secondary | ICD-10-CM

## 2022-04-12 ENCOUNTER — Other Ambulatory Visit: Payer: Self-pay | Admitting: Family Medicine

## 2022-04-12 DIAGNOSIS — N6320 Unspecified lump in the left breast, unspecified quadrant: Secondary | ICD-10-CM

## 2022-04-27 ENCOUNTER — Ambulatory Visit
Admission: RE | Admit: 2022-04-27 | Discharge: 2022-04-27 | Disposition: A | Discharge status: Home / Self Care | Payer: BC Managed Care – PPO | Source: Ambulatory Visit | Attending: Family Medicine | Admitting: Family Medicine

## 2022-04-27 DIAGNOSIS — N6320 Unspecified lump in the left breast, unspecified quadrant: Secondary | ICD-10-CM

## 2022-04-28 ENCOUNTER — Other Ambulatory Visit: Payer: Self-pay | Admitting: Family Medicine

## 2022-04-28 DIAGNOSIS — R599 Enlarged lymph nodes, unspecified: Secondary | ICD-10-CM

## 2022-04-28 DIAGNOSIS — N6320 Unspecified lump in the left breast, unspecified quadrant: Secondary | ICD-10-CM

## 2022-05-16 ENCOUNTER — Ambulatory Visit
Admission: RE | Admit: 2022-05-16 | Discharge: 2022-05-16 | Disposition: A | Discharge status: Home / Self Care | Payer: BC Managed Care – PPO | Source: Ambulatory Visit | Attending: Family Medicine | Admitting: Family Medicine

## 2022-05-16 DIAGNOSIS — N6320 Unspecified lump in the left breast, unspecified quadrant: Secondary | ICD-10-CM

## 2022-05-16 DIAGNOSIS — R599 Enlarged lymph nodes, unspecified: Secondary | ICD-10-CM

## 2022-05-18 ENCOUNTER — Telehealth: Payer: Self-pay | Admitting: Hematology and Oncology

## 2022-05-18 NOTE — Telephone Encounter (Signed)
Spoke to patient to confirm morning clinic appointment for 6/21, paperwork sent via e-mail

## 2022-05-23 ENCOUNTER — Encounter: Payer: Self-pay | Admitting: *Deleted

## 2022-05-23 DIAGNOSIS — Z171 Estrogen receptor negative status [ER-]: Secondary | ICD-10-CM | POA: Insufficient documentation

## 2022-05-24 NOTE — Progress Notes (Signed)
Radiation Oncology         [phone] ________________________________  Multidisciplinary Breast Oncology Clinic [facility]) Initial Outpatient Consultation  Name: Sydney Lee MRN: [id number]  Date: 05/25/2022  DOB: Dec 24, 1960  TW:[facility], Berneta Sages, MD  Donnie Mesa, MD   REFERRING PHYSICIAN: Donnie Mesa, MD  DIAGNOSIS: There were no encounter diagnoses.  Stage *** Left Breast UIQ, Invasive poorly differentiated ductal adenocarcinoma , ER- / PR- / Her2-, Grade 3  No diagnosis found.  HISTORY OF PRESENT ILLNESS::Sydney Lee is a 61 y.o. female who is presenting to the office today for evaluation of her newly diagnosed left breast cancer. She is accompanied by ***. She is doing well overall.   The patient presented with a palpable abnormality in the left breast, first noted approximately 1.5 months ago. Patient also noted the mass as slightly tender to palpation.  Subsequent bilateral diagnostic mammography with tomography and left breast ultrasonography at [facility] on 04/27/22 showed: an oval hypoechoic mass with angular margins and internal blood flow in the 10 o'clock location of the left breast 25 cmfn, measuring 2.9 x 1.7 x 2.2 cm. Imaging also revealed: a small satellite nodule adjacent to the index mass located at the 10 o'clock position, 26 cmfn, measuring 0.8 cm, at least three abnormal left axillary lymph nodes with cortical thickening measuring up to 3.6 millimeters, and a single borderline lymph node with cortical thickening of 2.9 millimeters. All other lymph nodes were appreciated with normal morphology.  Biopsy of the primary/larger 10 o'clock left breast mass (25 cmfn) on 05/16/22 showed: grade 3 invasive poorly differentiated ductal adenocarcinoma with tumor necrosis. Prognostic indicators significant for: estrogen receptor, 0% negative and progesterone receptor, 0% negative. Proliferation marker Ki67 at 80%. HER2 negative. Left axillary lymph node biopsy also  performed on this date showed no evidence of malignancy.   Menarche: *** years old Age at first live birth: *** years old GP: *** LMP: *** Contraceptive: *** HRT: ***   The patient was referred today for presentation in the multidisciplinary conference.  Radiology studies and pathology slides were presented there for review and discussion of treatment options.  A consensus was discussed regarding potential next steps.  PREVIOUS RADIATION THERAPY: No  PAST MEDICAL HISTORY:  Past Medical History:  Diagnosis Date   Anxiety    Diabetes mellitus without complication ([place])    Hypertension     PAST SURGICAL HISTORY:No past surgical history on file.  FAMILY HISTORY: No family history on file.  SOCIAL HISTORY:  Social History   Socioeconomic History   Marital status: Married    Spouse name: Not on file   Number of children: Not on file   Years of education: Not on file   Highest education level: Not on file  Occupational History   Not on file  Tobacco Use   Smoking status: Every Day    Packs/day: 1.00    Types: Cigarettes   Smokeless tobacco: Never  Substance and Sexual Activity   Alcohol use: No   Drug use: No   Sexual activity: Not on file  Other Topics Concern   Not on file  Social History Narrative   Not on file   Social Determinants of Health   Financial Resource Strain: Not on file  Food Insecurity: Not on file  Transportation Needs: Not on file  Physical Activity: Not on file  Stress: Not on file  Social Connections: Not on file    ALLERGIES:  Allergies  Allergen Reactions   Codeine  MEDICATIONS:  Current Outpatient Medications  Medication Sig Dispense Refill   amoxicillin (AMOXIL) 250 MG capsule Take 1 capsule (250 mg total) by mouth 3 (three) times daily. 30 capsule 0   benzonatate (TESSALON) 200 MG capsule Take 1 capsule (200 mg total) by mouth 3 (three) times daily as needed for cough. Swallow whole, do not chew 21 capsule 0   meclizine  (ANTIVERT) 25 MG tablet Take 1 tablet (25 mg total) by mouth 3 (three) times daily as needed for dizziness. 30 tablet 0   pseudoephedrine (SUDAFED) 30 MG tablet Take 1 tablet (30 mg total) by mouth every 6 (six) hours as needed for congestion. 30 tablet 0   No current facility-administered medications for this encounter.    REVIEW OF SYSTEMS: A 10+ POINT REVIEW OF SYSTEMS WAS OBTAINED including neurology, dermatology, psychiatry, cardiac, respiratory, lymph, extremities, GI, GU, musculoskeletal, constitutional, reproductive, HEENT. On the provided form, she reports ***. She denies *** and any other symptoms.    PHYSICAL EXAM:  vitals were not taken for this visit.  {may need to copy over vitals} Lungs are clear to auscultation bilaterally. Heart has regular rate and rhythm. No palpable cervical, supraclavicular, or axillary adenopathy. Abdomen soft, non-tender, normal bowel sounds. Breast: *** breast with no palpable mass, nipple discharge, or bleeding. *** breast with ***.   KPS = ***  100 - Normal; no complaints; no evidence of disease. 90   - Able to carry on normal activity; minor signs or symptoms of disease. 80   - Normal activity with effort; some signs or symptoms of disease. 46   - Cares for self; unable to carry on normal activity or to do active work. 60   - Requires occasional assistance, but is able to care for most of his personal needs. 50   - Requires considerable assistance and frequent medical care. 32   - Disabled; requires special care and assistance. 71   - Severely disabled; hospital admission is indicated although death not imminent. 32   - Very sick; hospital admission necessary; active supportive treatment necessary. 10   - Moribund; fatal processes progressing rapidly. 0     - Dead  Karnofsky DA, [place], Craver LS and Burchenal [facility] [phone]) The use of the nitrogen mustards in the palliative treatment of carcinoma: with particular reference to bronchogenic  carcinoma Cancer 1 [id number]  LABORATORY DATA:  Lab Results  Component Value Date   WBC 4.8 12/22/2018   HGB 12.2 12/22/2018   HCT 38.5 12/22/2018   MCV 90.8 12/22/2018   PLT 274 12/22/2018   Lab Results  Component Value Date   NA 135 12/22/2018   K 3.7 12/22/2018   CL 100 12/22/2018   CO2 23 12/22/2018   Lab Results  Component Value Date   ALT 12 (L) 10/12/2015   AST 15 10/12/2015   ALKPHOS 66 10/12/2015   BILITOT 0.7 10/12/2015    PULMONARY FUNCTION TEST:   Review Flowsheet        No data to display          RADIOGRAPHY: [place] AXILLARY NODE CORE BIOPSY LEFT  Addendum Date: 05/18/2022   ADDENDUM REPORT: 05/18/2022 09:04 ADDENDUM: Pathology revealed INVASIVE POORLY DIFFERENTIATED DUCTAL ADENOCARCINOMA, GRADE 3 (3+3+3) WITH TUMOR NECROSIS of the LEFT breast, 10 o'clock, 25 cmfn, (ribbon clip). This was found to be concordant by Dr. Lajean Manes Pathology revealed COMPATIBLE WITH A BENIGN REACTIVE LYMPH NODE of the LEFT axilla, (hydromark clip). This was found to be concordant by  Dr. Lajean Manes. Pathology results were discussed with the patient by telephone. The patient reported doing well after the biopsies with tenderness and bruising at the sites. Post biopsy instructions and care were reviewed and questions were answered. The patient was encouraged to call [facility] for any additional concerns. My direct phone number was provided. The patient was referred to [facility] on May 25, 2022. Pathology results reported by Terie Purser, RN on 05/18/2022. Electronically Signed   By: Lajean Manes M.D.   On: 05/18/2022 09:04   Result Date: 05/18/2022 CLINICAL DATA:  Patient presents for ultrasound-guided core needle biopsy of a left breast mass and 1 of 3 abnormal left axillary lymph nodes. On diagnostic imaging, there was also a small satellite lesion adjacent to the index left  breast mass. Imaging prior to biopsy demonstrated a projection from the mass, which was contiguous with the mass. There was no visualized separate satellite lesion. EXAM: ULTRASOUND GUIDED LEFT BREAST CORE NEEDLE BIOPSY ULTRASOUND GUIDED LEFT AXILLARY LYMPH NODE CORE NEEDLE BIOPSY COMPARISON:  Prior exams PROCEDURE: I met with the patient and we discussed the procedure of ultrasound-guided biopsy, including benefits and alternatives. We discussed the high likelihood of a successful procedure. We discussed the risks of the procedure, including infection, bleeding, tissue injury, clip migration, and inadequate sampling. Informed written consent was given. The usual time-out protocol was performed immediately prior to the procedure. Biopsy #1: Mass at 10 o'clock, 25 cm from the nipple. Lesion quadrant: Upper inner quadrant Using sterile technique and 1% Lidocaine as local anesthetic, under direct ultrasound visualization, a 12 gauge spring-loaded device was used to perform biopsy of the 2.9 cm mass using an inferior approach. At the conclusion of the procedure ribbon shaped tissue marker clip was deployed into the biopsy cavity. Biopsy #2: Abnormal left axillary lymph node. A round lymph node without evidence of hilar fat was targeted. Lesion location: Left axilla. Using sterile technique and 1% Lidocaine as local anesthetic, under direct ultrasound visualization, a 14 gauge spring-loaded device was used to perform biopsy of the round abnormal left axillary lymph node using a lateral approach. At the conclusion of the procedure a HydroMARK tissue marker clip was deployed into the biopsy cavity. Follow up 2 view mammogram was performed and dictated separately. IMPRESSION: Ultrasound guided biopsy of a left breast mass and an abnormal left axillary lymph node. No apparent complications. Electronically Signed: By: Lajean Manes M.D. On: 05/16/2022 08:33  [place] LT BREAST BX W LOC DEV 1ST LESION IMG BX SPEC US  GUIDE  Addendum Date: 05/18/2022   ADDENDUM REPORT: 05/18/2022 09:04 ADDENDUM: Pathology revealed INVASIVE POORLY DIFFERENTIATED DUCTAL ADENOCARCINOMA, GRADE 3 (3+3+3) WITH TUMOR NECROSIS of the LEFT breast, 10 o'clock, 25 cmfn, (ribbon clip). This was found to be concordant by Dr. Lajean Manes Pathology revealed COMPATIBLE WITH A BENIGN REACTIVE LYMPH NODE of the LEFT axilla, (hydromark clip). This was found to be concordant by Dr. Lajean Manes. Pathology results were discussed with the patient by telephone. The patient reported doing well after the biopsies with tenderness and bruising at the sites. Post biopsy instructions and care were reviewed and questions were answered. The patient was encouraged to call [facility] for any additional concerns. My direct phone number was provided. The patient was referred to [facility] on May 25, 2022. Pathology results reported by  Terie Purser, RN on 05/18/2022. Electronically Signed   By: Lajean Manes M.D.   On: 05/18/2022 09:04   Result Date: 05/18/2022 CLINICAL DATA:  Patient presents for ultrasound-guided core needle biopsy of a left breast mass and 1 of 3 abnormal left axillary lymph nodes. On diagnostic imaging, there was also a small satellite lesion adjacent to the index left breast mass. Imaging prior to biopsy demonstrated a projection from the mass, which was contiguous with the mass. There was no visualized separate satellite lesion. EXAM: ULTRASOUND GUIDED LEFT BREAST CORE NEEDLE BIOPSY ULTRASOUND GUIDED LEFT AXILLARY LYMPH NODE CORE NEEDLE BIOPSY COMPARISON:  Prior exams PROCEDURE: I met with the patient and we discussed the procedure of ultrasound-guided biopsy, including benefits and alternatives. We discussed the high likelihood of a successful procedure. We discussed the risks of the procedure, including infection, bleeding, tissue injury, clip  migration, and inadequate sampling. Informed written consent was given. The usual time-out protocol was performed immediately prior to the procedure. Biopsy #1: Mass at 10 o'clock, 25 cm from the nipple. Lesion quadrant: Upper inner quadrant Using sterile technique and 1% Lidocaine as local anesthetic, under direct ultrasound visualization, a 12 gauge spring-loaded device was used to perform biopsy of the 2.9 cm mass using an inferior approach. At the conclusion of the procedure ribbon shaped tissue marker clip was deployed into the biopsy cavity. Biopsy #2: Abnormal left axillary lymph node. A round lymph node without evidence of hilar fat was targeted. Lesion location: Left axilla. Using sterile technique and 1% Lidocaine as local anesthetic, under direct ultrasound visualization, a 14 gauge spring-loaded device was used to perform biopsy of the round abnormal left axillary lymph node using a lateral approach. At the conclusion of the procedure a HydroMARK tissue marker clip was deployed into the biopsy cavity. Follow up 2 view mammogram was performed and dictated separately. IMPRESSION: Ultrasound guided biopsy of a left breast mass and an abnormal left axillary lymph node. No apparent complications. Electronically Signed: By: Lajean Manes M.D. On: 05/16/2022 08:33   MM CLIP PLACEMENT LEFT  Result Date: 05/16/2022 CLINICAL DATA:  Assess post biopsy marker clip following ultrasound-guided core needle biopsy of a left breast mass and left axillary lymph node. EXAM: 3D DIAGNOSTIC LEFT MAMMOGRAM POST ULTRASOUND BIOPSY COMPARISON:  Previous exam(s). FINDINGS: 3D Mammographic images were obtained following ultrasound guided biopsy of a left breast mass and left axillary lymph node. The ribbon shaped biopsy clip lies within the center of the medial, posterior left breast mass, and the [facility] clip lies within 1 of several adjacent lymph nodes in the left axilla. IMPRESSION: Appropriate positioning of the ribbon  shaped and HydroMARK biopsy marking clips at the site of biopsy in the medial left breast and left axilla respectively. Final Assessment: Post Procedure Mammograms for Marker Placement Electronically Signed   By: Lajean Manes M.D.   On: 05/16/2022 08:50  MM DIAG BREAST TOMO BILATERAL  Result Date: 04/27/2022 CLINICAL DATA:  Palpable abnormality in the LEFT breast, first noted 1 and half months ago. Mass is slightly tender when palpated. Patient denies any trauma in this portion of the breast. EXAM: DIGITAL DIAGNOSTIC BILATERAL MAMMOGRAM WITH TOMOSYNTHESIS AND CAD; ULTRASOUND LEFT BREAST LIMITED TECHNIQUE: Bilateral digital diagnostic mammography and breast tomosynthesis was performed. The images were evaluated with computer-aided detection.; Targeted ultrasound examination of the left breast was performed. COMPARISON:  05/07/2010 ACR Breast Density Category b: There are scattered areas of fibroglandular density. FINDINGS: RIGHT BREAST: Mammogram: No suspicious mass, distortion, or microcalcifications  are identified to suggest presence of malignancy. Mammographic images were processed with CAD. LEFT BREAST: Mammogram: There is an oval mass with indistinct margins in the MEDIAL aspect of the LEFT breast corresponding to the palpable abnormality. Mass is estimated to measure at least 2.3 centimeters. Just posterior to the mass, a 0.6 centimeter possible satellite is present. Mammographic images were processed with CAD. Physical Exam: I palpate a discrete mobile firm mass in the 10 o'clock location of the LEFT breast. There is no overlying ecchymosis. Ultrasound: Targeted ultrasound is performed, showing an oval hypoechoic mass with angular margins and internal blood flow in the 10 o'clock location of the LEFT breast 25 centimeters from the nipple. Mass measures 2.9 x 1.7 x 2.2 centimeters. In the 10 o'clock location 26 centimeters from nipple, a small oval satellite mass is 0.6 x 0.7 x 0.8 centimeters. Measured  together, these lesions are 3.9 centimeters. In this region, there is parenchymal in skin thickening. Evaluation of the LEFT axilla demonstrates 3 abnormal lymph nodes with cortical thickening up to 3.6 millimeters. Borderline lymph node has cortical thickening of 2.9 millimeters. Other lymph nodes have normal morphology. IMPRESSION: Indeterminate solid mass in the 10 o'clock location of the LEFT breast. Small satellite nodule adjacent to the index mass. At least three abnormal LEFT axillary lymph nodes. Single borderline LEFT axillary lymph node. RECOMMENDATION: Recommend ultrasound-guided core biopsy of the 2 masses in the 10 o'clock location of the LEFT breast. Recommend ultrasound-guided core biopsy of 1 of the abnormal LEFT axillary lymph nodes. I have discussed the findings and recommendations with the patient. If applicable, a reminder letter will be sent to the patient regarding the next appointment. BI-RADS CATEGORY  5: Highly suggestive of malignancy. Electronically Signed   By: Nolon Nations M.D.   On: 04/27/2022 10:52  US BREAST LTD UNI LEFT INC AXILLA  Result Date: 04/27/2022 CLINICAL DATA:  Palpable abnormality in the LEFT breast, first noted 1 and half months ago. Mass is slightly tender when palpated. Patient denies any trauma in this portion of the breast. EXAM: DIGITAL DIAGNOSTIC BILATERAL MAMMOGRAM WITH TOMOSYNTHESIS AND CAD; ULTRASOUND LEFT BREAST LIMITED TECHNIQUE: Bilateral digital diagnostic mammography and breast tomosynthesis was performed. The images were evaluated with computer-aided detection.; Targeted ultrasound examination of the left breast was performed. COMPARISON:  05/07/2010 ACR Breast Density Category b: There are scattered areas of fibroglandular density. FINDINGS: RIGHT BREAST: Mammogram: No suspicious mass, distortion, or microcalcifications are identified to suggest presence of malignancy. Mammographic images were processed with CAD. LEFT BREAST: Mammogram: There is an  oval mass with indistinct margins in the MEDIAL aspect of the LEFT breast corresponding to the palpable abnormality. Mass is estimated to measure at least 2.3 centimeters. Just posterior to the mass, a 0.6 centimeter possible satellite is present. Mammographic images were processed with CAD. Physical Exam: I palpate a discrete mobile firm mass in the 10 o'clock location of the LEFT breast. There is no overlying ecchymosis. Ultrasound: Targeted ultrasound is performed, showing an oval hypoechoic mass with angular margins and internal blood flow in the 10 o'clock location of the LEFT breast 25 centimeters from the nipple. Mass measures 2.9 x 1.7 x 2.2 centimeters. In the 10 o'clock location 26 centimeters from nipple, a small oval satellite mass is 0.6 x 0.7 x 0.8 centimeters. Measured together, these lesions are 3.9 centimeters. In this region, there is parenchymal in skin thickening. Evaluation of the LEFT axilla demonstrates 3 abnormal lymph nodes with cortical thickening up to 3.6 millimeters. Borderline lymph  node has cortical thickening of 2.9 millimeters. Other lymph nodes have normal morphology. IMPRESSION: Indeterminate solid mass in the 10 o'clock location of the LEFT breast. Small satellite nodule adjacent to the index mass. At least three abnormal LEFT axillary lymph nodes. Single borderline LEFT axillary lymph node. RECOMMENDATION: Recommend ultrasound-guided core biopsy of the 2 masses in the 10 o'clock location of the LEFT breast. Recommend ultrasound-guided core biopsy of 1 of the abnormal LEFT axillary lymph nodes. I have discussed the findings and recommendations with the patient. If applicable, a reminder letter will be sent to the patient regarding the next appointment. BI-RADS CATEGORY  5: Highly suggestive of malignancy. Electronically Signed   By: Nolon Nations M.D.   On: 04/27/2022 10:52     IMPRESSION: ***  Patient will be a good candidate for breast conservation with radiotherapy to  the left breast. We discussed the general course of radiation, potential side effects, and toxicities with radiation and the patient is interested in this approach. ***   PLAN:  ***    ------------------------------------------------  Blair Promise, PhD, MD  This document serves as a record of services personally performed by Gery Pray, MD. It was created on his behalf by Roney Mans, a trained medical scribe. The creation of this record is based on the scribe's personal observations and the provider's statements to them. This document has been checked and approved by the attending provider.

## 2022-05-25 ENCOUNTER — Inpatient Hospital Stay: Payer: BC Managed Care – PPO | Attending: Hematology and Oncology | Admitting: Hematology and Oncology

## 2022-05-25 ENCOUNTER — Other Ambulatory Visit: Payer: Self-pay

## 2022-05-25 ENCOUNTER — Other Ambulatory Visit: Payer: Self-pay | Admitting: Hematology and Oncology

## 2022-05-25 ENCOUNTER — Ambulatory Visit: Payer: BC Managed Care – PPO | Attending: General Surgery | Admitting: Physical Therapy

## 2022-05-25 ENCOUNTER — Inpatient Hospital Stay: Payer: BC Managed Care – PPO

## 2022-05-25 ENCOUNTER — Encounter: Payer: Self-pay | Admitting: Physical Therapy

## 2022-05-25 ENCOUNTER — Ambulatory Visit
Admission: RE | Admit: 2022-05-25 | Discharge: 2022-05-25 | Disposition: A | Discharge status: Home / Self Care | Payer: BC Managed Care – PPO | Source: Ambulatory Visit | Attending: Radiation Oncology | Admitting: Radiation Oncology

## 2022-05-25 ENCOUNTER — Encounter: Payer: Self-pay | Admitting: *Deleted

## 2022-05-25 ENCOUNTER — Encounter: Payer: Self-pay | Admitting: Emergency Medicine

## 2022-05-25 ENCOUNTER — Encounter: Payer: Self-pay | Admitting: Hematology and Oncology

## 2022-05-25 ENCOUNTER — Other Ambulatory Visit: Payer: Self-pay | Admitting: General Surgery

## 2022-05-25 ENCOUNTER — Encounter: Payer: Self-pay | Admitting: Genetic Counselor

## 2022-05-25 ENCOUNTER — Inpatient Hospital Stay (HOSPITAL_BASED_OUTPATIENT_CLINIC_OR_DEPARTMENT_OTHER): Payer: BC Managed Care – PPO | Admitting: Genetic Counselor

## 2022-05-25 DIAGNOSIS — C50212 Malignant neoplasm of upper-inner quadrant of left female breast: Secondary | ICD-10-CM

## 2022-05-25 DIAGNOSIS — Z803 Family history of malignant neoplasm of breast: Secondary | ICD-10-CM

## 2022-05-25 DIAGNOSIS — Z171 Estrogen receptor negative status [ER-]: Secondary | ICD-10-CM | POA: Diagnosis present

## 2022-05-25 DIAGNOSIS — Z8041 Family history of malignant neoplasm of ovary: Secondary | ICD-10-CM

## 2022-05-25 DIAGNOSIS — R293 Abnormal posture: Secondary | ICD-10-CM | POA: Insufficient documentation

## 2022-05-25 LAB — CBC WITH DIFFERENTIAL (CANCER CENTER ONLY)
Abs Immature Granulocytes: 0.02 10*3/uL (ref 0.00–0.07)
Basophils Absolute: 0.1 10*3/uL (ref 0.0–0.1)
Basophils Relative: 1 %
Eosinophils Absolute: 0.2 10*3/uL (ref 0.0–0.5)
Eosinophils Relative: 3 %
HCT: 39.2 % (ref 36.0–46.0)
Hemoglobin: 13.2 g/dL (ref 12.0–15.0)
Immature Granulocytes: 0 %
Lymphocytes Relative: 41 %
Lymphs Abs: 2.2 10*3/uL (ref 0.7–4.0)
MCH: 29.8 pg (ref 26.0–34.0)
MCHC: 33.7 g/dL (ref 30.0–36.0)
MCV: 88.5 fL (ref 80.0–100.0)
Monocytes Absolute: 0.4 10*3/uL (ref 0.1–1.0)
Monocytes Relative: 8 %
Neutro Abs: 2.5 10*3/uL (ref 1.7–7.7)
Neutrophils Relative %: 47 %
Platelet Count: 308 10*3/uL (ref 150–400)
RBC: 4.43 MIL/uL (ref 3.87–5.11)
RDW: 13.2 % (ref 11.5–15.5)
WBC Count: 5.3 10*3/uL (ref 4.0–10.5)
nRBC: 0 % (ref 0.0–0.2)

## 2022-05-25 LAB — CMP (CANCER CENTER ONLY)
ALT: 9 U/L (ref 0–44)
AST: 11 U/L — ABNORMAL LOW (ref 15–41)
Albumin: 4 g/dL (ref 3.5–5.0)
Alkaline Phosphatase: 85 U/L (ref 38–126)
Anion gap: 7 (ref 5–15)
BUN: 13 mg/dL (ref 8–23)
CO2: 28 mmol/L (ref 22–32)
Calcium: 9.4 mg/dL (ref 8.9–10.3)
Chloride: 105 mmol/L (ref 98–111)
Creatinine: 0.85 mg/dL (ref 0.44–1.00)
GFR, Estimated: >60 mL/min (ref >60)
Glucose, Bld: 159 mg/dL — ABNORMAL HIGH (ref 70–99)
Potassium: 3.7 mmol/L (ref 3.5–5.1)
Sodium: 140 mmol/L (ref 135–145)
Total Bilirubin: 0.3 mg/dL (ref 0.3–1.2)
Total Protein: 7.5 g/dL (ref 6.5–8.1)

## 2022-05-25 LAB — GENETIC SCREENING ORDER

## 2022-05-25 NOTE — Research (Signed)
[employer] 2021-05 - Specimen Collection Study to Evaluate Biomarkers in Subjects with Cancer   05/25/22  Patient Sydney Lee was identified by Dr. Chryl Heck as a potential candidate for the above listed study.  This Clinical Research Coordinator met with Sydney Lee, MRN [id number], on 05/25/22 in a manner and location that ensures patient privacy to discuss participation in the above listed research study.  Patient is Accompanied by her daughter .  A copy of the informed consent document with embedded HIPAA language was provided to the patient.  Patient reads, speaks, and understands [place].    Patient was provided with the business card of this Coordinator and encouraged to contact the research team with any questions.  Approximately 10 minutes were spent with the patient reviewing the informed consent documents.  Patient was provided the option of taking informed consent documents home to review and was encouraged to review at their convenience with their support network, including other care providers. Patient took the consent documents home to review.  Will follow up with the patient early next week to determine interest in participating in this study.  Sydney Lee Clinical Research Coordinator I  05/25/22 12:17 PM

## 2022-05-25 NOTE — Progress Notes (Signed)
[facility] NOTE  Patient Care Team: [name], MD as PCP - General (Family Medicine) Mauro Kaufmann, RN as Oncology Nurse Navigator Rockwell Germany, RN as Oncology Nurse Navigator Stark Klein, MD as Consulting Physician (General Surgery) Benay Pike, MD as Consulting Physician (Hematology and Oncology) Gery Pray, MD as Consulting Physician (Radiation Oncology)  CHIEF COMPLAINTS/PURPOSE OF CONSULTATION:  Newly diagnosed breast cancer  HISTORY OF PRESENTING ILLNESS:  Sydney Lee 61 y.o. female is here because of recent diagnosis of left breast IDC  I reviewed her records extensively and collaborated the history with the patient.  SUMMARY OF ONCOLOGIC HISTORY: Oncology History  Malignant neoplasm of upper-inner quadrant of left breast in female, estrogen receptor negative ([place])  04/27/2022 Mammogram   Diagnostic mammogram showed indeterminate solid mass in the 10:00 location of the left breast.  Small satellite nodule adjacent to the index mass.  Borderline lymph node has cortical thickening of 2.9 mm.  Other lymph nodes have normal morphology.   05/16/2022 Pathology Results   Left breast needle core biopsy showed invasive poorly differentiated adenocarcinoma, grade 3 with tumor necrosis, lymph node biopsy benign reactive, negative for carcinoma.  Prognostics from the tumor showed ER 0%, negative, PR 0%, negative, Ki-67 of 80% and HER2 negative   05/23/2022 Initial Diagnosis   Malignant neoplasm of upper-inner quadrant of left breast in female, estrogen receptor negative ([place])   05/25/2022 Cancer Staging   Staging form: Breast, AJCC 8th Edition - Clinical: Stage IIB (cT2, cN0, cM0, G3, ER-, PR-, HER2-) - Signed by Benay Pike, MD on 05/25/2022 Stage prefix: Initial diagnosis Histologic grading system: 3 grade system   06/01/2022 -  Chemotherapy   Patient is on Treatment Plan : BREAST Pembrolizumab (200) D1 + Carboplatin (5) D1 + Paclitaxel  (80) D1,8,15 q21d X 4 cycles / Pembrolizumab (200) D1 + AC D1 q21d x 4 cycles      She arrived to the appointment with her daughter today. She felt this mass recently, didn't have a mammogram in many yrs ago. She says the breast mass came out of no where. She does have family history of breast cancer, there is one first cousin who had BC in 63's currently living and is 48. Mom had stomach cancer in 72's and is currently 89. She smokes 7/8 cigarettes a day, used to pack 1 PPD for almost 45 yrs. DM is well controlled at 6.9%. 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 Rest of the pertinent 10 point ROS reviewed and negative.  MEDICAL HISTORY:  Past Medical History:  Diagnosis Date   Anxiety    Diabetes mellitus without complication ([place])    Hypertension     SURGICAL HISTORY: History reviewed. No pertinent surgical history.  SOCIAL HISTORY: Social History   Socioeconomic History   Marital status: Married    Spouse name: Not on file   Number of children: Not on file   Years of education: Not on file   Highest education level: Not on file  Occupational History   Not on file  Tobacco Use   Smoking status: Every Day    Packs/day: 1.00    Types: Cigarettes   Smokeless tobacco: Never  Substance and Sexual Activity   Alcohol use: No   Drug use: No   Sexual activity: Not on file  Other Topics Concern   Not on file  Social History Narrative   Not on file   Social Determinants of Health   Financial Resource  Strain: Not on file  Food Insecurity: Not on file  Transportation Needs: Not on file  Physical Activity: Not on file  Stress: Not on file  Social Connections: Not on file  Intimate Partner Violence: Not on file    FAMILY HISTORY: History reviewed. No pertinent family history.  ALLERGIES:  is allergic to codeine.  MEDICATIONS:  Current Outpatient Medications  Medication Sig Dispense Refill   ALPRAZolam (XANAX) 0.25 MG tablet Take 0.25 mg by mouth 2 (two) times  daily.     atorvastatin (LIPITOR) 10 MG tablet Take 10 mg by mouth 3 (three) times a week.     JARDIANCE 10 MG TABS tablet Take 10 mg by mouth daily.     losartan (COZAAR) 100 MG tablet Take 1 tablet by mouth 2 (two) times daily.     meclizine (ANTIVERT) 25 MG tablet Take 25 mg by mouth once a week.     metFORMIN (GLUCOPHAGE) 1000 MG tablet Take 1,000 mg by mouth 2 (two) times daily.     OZEMPIC, 1 MG/DOSE, 4 MG/3ML SOPN Inject 1 mg into the skin once a week.     No current facility-administered medications for this visit.    REVIEW OF SYSTEMS:   Constitutional: Denies fevers, chills or abnormal night sweats Eyes: Denies blurriness of vision, double vision or watery eyes Ears, nose, mouth, throat, and face: Denies mucositis or sore throat Respiratory: Denies cough, dyspnea or wheezes Cardiovascular: Denies palpitation, chest discomfort or lower extremity swelling Gastrointestinal:  Denies nausea, heartburn or change in bowel habits Skin: Denies abnormal skin rashes Lymphatics: Denies new lymphadenopathy or easy bruising Neurological:Denies numbness, tingling or new weaknesses Behavioral/Psych: Mood is stable, no new changes  Breast:  Denies any palpable lumps or discharge All other systems were reviewed with the patient and are negative.  PHYSICAL EXAMINATION: ECOG PERFORMANCE STATUS: 0 - Asymptomatic  Vitals:   05/25/22 0835  BP: 119/74  Pulse: 84  Resp: 18  Temp: 97.7 F (36.5 C)  SpO2: 98%   Filed Weights   05/25/22 0835  Weight: 221 lb 3.2 oz (100.3 kg)    GENERAL:alert, no distress and comfortable SKIN: skin color, texture, turgor are normal, no rashes or significant lesions EYES: normal, conjunctiva are pink and non-injected, sclera clear OROPHARYNX:no exudate, no erythema and lips, buccal mucosa, and tongue normal  NECK: supple, thyroid normal size, non-tender, without nodularity LYMPH:  no palpable lymphadenopathy in the cervical, axillary or inguinal LUNGS:  clear to auscultation and percussion with normal breathing effort HEART: regular rate & rhythm and no murmurs and no lower extremity edema ABDOMEN:abdomen soft, non-tender and normal bowel sounds Musculoskeletal:no cyanosis of digits and no clubbing  PSYCH: alert & oriented x 3 with fluent speech NEURO: no focal motor/sensory deficits BREAST: No palpable nodules in breast. No palpable axillary or supraclavicular lymphadenopathy (exam performed in the presence of a chaperone)   LABORATORY DATA:  I have reviewed the data as listed Lab Results  Component Value Date   WBC 5.3 05/25/2022   HGB 13.2 05/25/2022   HCT 39.2 05/25/2022   MCV 88.5 05/25/2022   PLT 308 05/25/2022   Lab Results  Component Value Date   NA 140 05/25/2022   K 3.7 05/25/2022   CL 105 05/25/2022   CO2 28 05/25/2022    RADIOGRAPHIC STUDIES: I have personally reviewed the radiological reports and agreed with the findings in the report.  ASSESSMENT AND PLAN:  Malignant neoplasm of upper-inner quadrant of left breast in female, estrogen receptor  negative [facility]) This is a very pleasant 61 year old female patient with newly diagnosed self detected left breast mass status post imaging and biopsy with pathology showing invasive poorly differentiated adenocarcinoma, grade 3, triple negative, high proliferation index of 80% presented in the breast [place] for additional recommendations.  Given triple negative tumor larger than 2 mm at diagnosis, we have discussed about considering neoadjuvant chemotherapy.  Today we have discussed about keynote 522 trial and the role of chemoimmunotherapy in the neoadjuvant setting for triple negative breast cancer.  We have discussed about the regimen, adverse effects of chemotherapy including but not limited to fatigue, nausea, vomiting, diarrhea, cytopenias, cardiotoxicity, neuropathy, risk of infections, alopecia, immunotherapy related adverse effects.  She understands that some of the side  effects from chemoimmunotherapy can be life-threatening, and permanent.  We have discussed excellent complete pathologic response with neoadjuvant chemoimmunotherapy and triple negative breast cancers.  Following neoadjuvant chemotherapy, she will proceed with surgery and adjuvant immunotherapy depending on tolerance.  There is no role for antiestrogen therapy in this setting.  She will need an MRI after completion of neoadjuvant chemotherapy for surgical planning.  PLAN  MRI bilateral breast Chemo education Port placement Baseline ECHO. Referral to genetics RTC with 2 weeks.   All questions were answered. The patient knows to call the clinic with any problems, questions or concerns. Total time spent: 60 minutes including H and P, review of records, counseling and coordination of care.   Benay Pike, MD 05/25/22

## 2022-05-25 NOTE — Progress Notes (Signed)
START ON PATHWAY REGIMEN - Breast ? ? ?  Cycles 1 through 4: A cycle is every 21 days: ?    Pembrolizumab  ?    Paclitaxel  ?    Carboplatin  ?    Filgrastim-xxxx  ?  Cycles 5 through 8: A cycle is every 21 days: ?    Pembrolizumab  ?    Doxorubicin  ?    Cyclophosphamide  ?    Pegfilgrastim-xxxx  ? ?**Always confirm dose/schedule in your pharmacy ordering system** ? ?Patient Characteristics: ?Preoperative or Nonsurgical Candidate (Clinical Staging), Neoadjuvant Therapy followed by Surgery, Invasive Disease, Chemotherapy, HER2 Negative/Unknown/Equivocal, ER Negative/Unknown, Platinum Therapy Indicated and Candidate for Checkpoint Inhibitor ?Therapeutic Status: Preoperative or Nonsurgical Candidate (Clinical Staging) ?AJCC M Category: cM0 ?AJCC Grade: G3 ?Breast Surgical Plan: Neoadjuvant Therapy followed by Surgery ?ER Status: Negative (-) ?AJCC 8 Stage Grouping: IIB ?HER2 Status: Negative (-) ?AJCC T Category: cT2 ?AJCC N Category: cN0 ?PR Status: Negative (-) ?Intent of Therapy: ?Curative Intent, Discussed with Patient ?

## 2022-05-25 NOTE — Assessment & Plan Note (Addendum)
This is a very pleasant 61 year old female patient with newly diagnosed self detected left breast mass status post imaging and biopsy with pathology showing invasive poorly differentiated adenocarcinoma, grade 3, triple negative, high proliferation index of 80% presented in the breast [place] for additional recommendations.  Given triple negative tumor larger than 2 mm at diagnosis, we have discussed about considering neoadjuvant chemotherapy.  Today we have discussed about keynote 522 trial and the role of chemoimmunotherapy in the neoadjuvant setting for triple negative breast cancer.  We have discussed about the regimen, adverse effects of chemotherapy including but not limited to fatigue, nausea, vomiting, diarrhea, cytopenias, cardiotoxicity, neuropathy, risk of infections, alopecia, immunotherapy related adverse effects.  She understands that some of the side effects from chemoimmunotherapy can be life-threatening, and permanent.  We have discussed excellent complete pathologic response with neoadjuvant chemoimmunotherapy and triple negative breast cancers.  Following neoadjuvant chemotherapy, she will proceed with surgery and adjuvant immunotherapy depending on tolerance.  There is no role for antiestrogen therapy in this setting.  She will need an MRI after completion of neoadjuvant chemotherapy for surgical planning.  PLAN  MRI bilateral breast Chemo education Port placement Baseline ECHO. Referral to genetics RTC with 2 weeks.

## 2022-05-25 NOTE — Therapy (Signed)
OUTPATIENT PHYSICAL THERAPY BREAST CANCER BASELINE EVALUATION   Patient Name: Sydney Lee MRN: [id number] DOB:23-Nov-1961, 61 y.o., female Today's Date: 05/25/2022   PT End of Session - 05/25/22 1101     Visit Number 1    Number of Visits 2    Date for PT Re-Evaluation 11/24/22    PT Start Time 1003    PT Stop Time 1014   Also saw pt from 1037-1057 for a total of 31 min   PT Time Calculation (min) 11 min    Activity Tolerance Patient tolerated treatment well    Behavior During Therapy [facility] for tasks assessed/performed             Past Medical History:  Diagnosis Date   Anxiety    Diabetes mellitus without complication ([facility])    Hypertension    History reviewed. No pertinent surgical history. Patient Active Problem List   Diagnosis Date Noted   Malignant neoplasm of upper-inner quadrant of left breast in female, estrogen receptor negative ([facility]) 05/23/2022    REFERRING PROVIDER: Dr. Almond Lint  REFERRING DIAG: Left breast cancer  THERAPY DIAG:  Malignant neoplasm of upper-inner quadrant of left breast in female, estrogen receptor negative ([facility])  Abnormal posture  Rationale for Evaluation and Treatment Rehabilitation  ONSET DATE: 04/27/2022  SUBJECTIVE                                                                                                                                                                                           SUBJECTIVE STATEMENT: Patient reports she is here today to be seen by her medical team for her newly diagnosed left breast cancer.   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 It measures 2.9 cm and 8 mm and is located in the upper inner quadrant. It is triple negative with a Ki67 of 80%. She smokes 7-8 cigarettes per day.  PATIENT GOALS   reduce lymphedema risk and learn post op HEP.   PAIN:  Are you having pain? No   PRECAUTIONS: Active CA   HAND  DOMINANCE: right  WEIGHT BEARING RESTRICTIONS No  FALLS:  Has patient fallen in last 6 months? No  LIVING ENVIRONMENT: Patient lives with: her husband, 80 y.o. daughter, 46 y.o. Mom, and 60 month old granddaughter Lives in: House/apartment Has following equipment at home: None  OCCUPATION: She works full time as a Administrator, arts on a school bus  LEISURE: She does not exercise  PRIOR LEVEL OF FUNCTION: Independent   OBJECTIVE  COGNITION:  Overall cognitive status: Within functional limits for tasks assessed  POSTURE:  Forward head and rounded shoulders posture  UPPER EXTREMITY AROM/PROM:  A/PROM RIGHT   eval   Shoulder extension 46  Shoulder flexion 149  Shoulder abduction 160  Shoulder internal rotation 50  Shoulder external rotation 70    (Blank rows = not tested)  A/PROM LEFT   eval  Shoulder extension 40  Shoulder flexion 123  Shoulder abduction 147  Shoulder internal rotation 53  Shoulder external rotation 57    (Blank rows = not tested)   CERVICAL AROM: All within normal limits  UPPER EXTREMITY STRENGTH: WNL   LYMPHEDEMA ASSESSMENTS:   LANDMARK RIGHT   eval  10 cm proximal to olecranon process 31  Olecranon process 25.7  10 cm proximal to ulnar styloid process 22.7  Just proximal to ulnar styloid process 17.2  Across hand at thumb web space 20.2  At base of 2nd digit 7.5  (Blank rows = not tested)  LANDMARK LEFT   eval  10 cm proximal to olecranon process 31.4  Olecranon process 26.3  10 cm proximal to ulnar styloid process 21.7  Just proximal to ulnar styloid process 17.2  Across hand at thumb web space 20.4  At base of 2nd digit 7.3  (Blank rows = not tested)   L-DEX LYMPHEDEMA SCREENING:  The patient was assessed using the L-Dex machine today to produce a lymphedema index baseline score. The patient will be reassessed on a regular basis (typically every 3 months) to obtain new L-Dex scores. If the score is > 6.5 points away  from his/her baseline score indicating onset of subclinical lymphedema, it will be recommended to wear a compression garment for 4 weeks, 12 hours per day and then be reassessed. If the score continues to be > 6.5 points from baseline at reassessment, we will initiate lymphedema treatment. Assessing in this manner has a 95% rate of preventing clinically significant lymphedema.   L-DEX FLOWSHEETS - 05/25/22 1100       L-DEX LYMPHEDEMA SCREENING   Measurement Type Unilateral    L-DEX MEASUREMENT EXTREMITY Upper Extremity    POSITION  Standing    DOMINANT SIDE Right    At Risk Side Left    BASELINE SCORE (UNILATERAL) 2.3              QUICK DASH SURVEY:  Katina Dung - 05/25/22 0001     Open a tight or new jar No difficulty    Do heavy household chores (wash walls, wash floors) No difficulty    Carry a shopping bag or briefcase No difficulty    Wash your back No difficulty    Use a knife to cut food No difficulty    Recreational activities in which you take some force or impact through your arm, shoulder, or hand (golf, hammering, tennis) No difficulty    During the past week, to what extent has your arm, shoulder or hand problem interfered with your normal social activities with family, friends, neighbors, or groups? Not at all    During the past week, to what extent has your arm, shoulder or hand problem limited your work or other regular daily activities Not at all    Arm, shoulder, or hand pain. None    Tingling (pins and needles) in your arm, shoulder, or hand Mild    Difficulty Sleeping No difficulty    DASH Score 2.27 %              PATIENT EDUCATION:  Education details: Lymphedema risk reduction and post  op shoulder/posture HEP Person educated: Patient Education method: Explanation, Demonstration, Handout Education comprehension: Patient verbalized understanding and returned demonstration   HOME EXERCISE PROGRAM: Patient was instructed today in a home exercise  program today for post op shoulder range of motion. These included active assist shoulder flexion in sitting, scapular retraction, wall walking with shoulder abduction, and hands behind head external rotation.  She was encouraged to do these twice a day, holding 3 seconds and repeating 5 times when permitted by her physician.   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 It measures 2.9 cm and 8 mm and is located in the upper inner quadrant. It is triple negative with a Ki67 of 80%. She smokes 7-8 cigarettes per day.Her multidisciplinary medical team met prior to her assessments to determine a recommended treatment plan. She is planning to have neoadjuvant chemotherapy followed by a left lumpectomy and sentinel node biopsy, and radiation. She will benefit from a post op PT reassessment to determine needs and from L-Dex screens every 3 months for 2 years to detect subclinical lymphedema.  Pt will benefit from skilled therapeutic intervention to improve on the following deficits: Decreased knowledge of precautions, impaired UE functional use, pain, decreased ROM, postural dysfunction.   PT treatment/interventions: ADL/self-care home management, pt/family education, therapeutic exercise  REHAB POTENTIAL: Excellent  CLINICAL DECISION MAKING: Stable/uncomplicated  EVALUATION COMPLEXITY: Low   GOALS: Goals reviewed with patient? YES  LONG TERM GOALS: (STG=LTG)    Name Target Date Goal status  1 Pt will be able to verbalize understanding of pertinent lymphedema risk reduction practices relevant to her dx specifically related to skin care.  Baseline:  No knowledge 05/25/2022 Achieved at eval  2 Pt will be able to return demo and/or verbalize understanding of the post op HEP related to regaining shoulder ROM. Baseline:  No knowledge 05/25/2022 Achieved at eval  3 Pt will be able to verbalize understanding of the  importance of attending the post op After Breast CA Class for further lymphedema risk reduction education and therapeutic exercise.  Baseline:  No knowledge 05/25/2022 Achieved at eval  4 Pt will demo she has regained full shoulder ROM and function post operatively compared to baselines.  Baseline: See objective measurements taken today. 10/25/2022      PLAN: PT FREQUENCY/DURATION: EVAL and 1 follow up appointment.   PLAN FOR NEXT SESSION: will reassess 3-4 weeks post op to determine needs.   Patient will follow up at outpatient cancer rehab 3-4 weeks following surgery.  If the patient requires physical therapy at that time, a specific plan will be dictated and sent to the referring physician for approval. The patient was educated today on appropriate basic range of motion exercises to begin post operatively and the importance of attending the After Breast Cancer class following surgery.  Patient was educated today on lymphedema risk reduction practices as it pertains to recommendations that will benefit the patient immediately following surgery.  She verbalized good understanding.    Physical Therapy Information for After Breast Cancer Surgery/Treatment:  Lymphedema is a swelling condition that you may be at risk for in your arm if you have lymph nodes removed from the armpit area.  After a sentinel node biopsy, the risk is approximately 5-9% and is higher after an axillary node dissection.  There is treatment available for this condition and it is not life-threatening.  Contact your physician or physical therapist with concerns. You may begin the 4  shoulder/posture exercises (see additional sheet) when permitted by your physician (typically a week after surgery).  If you have drains, you may need to wait until those are removed before beginning range of motion exercises.  A general recommendation is to not lift your arms above shoulder height until drains are removed.  These exercises should be  done to your tolerance and gently.  This is not a "no pain/no gain" type of recovery so listen to your body and stretch into the range of motion that you can tolerate, stopping if you have pain.  If you are having immediate reconstruction, ask your plastic surgeon about doing exercises as he or she may want you to wait. We encourage you to attend the free one time ABC (After Breast Cancer) class offered by [facility].  You will learn information related to lymphedema risk, prevention and treatment and additional exercises to regain mobility following surgery.  You can call [phone] for more information.  This is offered the 1st and 3rd Monday of each month.  You only attend the class one time. While undergoing any medical procedure or treatment, try to avoid blood pressure being taken or needle sticks from occurring on the arm on the side of cancer.   This recommendation begins after surgery and continues for the rest of your life.  This may help reduce your risk of getting lymphedema (swelling in your arm). An excellent resource for those seeking information on lymphedema is the National Lymphedema Network's web site. It can be accessed at [email or web address]org If you notice swelling in your hand, arm or breast at any time following surgery (even if it is many years from now), please contact your doctor or physical therapist to discuss this.  Lymphedema can be treated at any time but it is easier for you if it is treated early on.  If you feel like your shoulder motion is not returning to normal in a reasonable amount of time, please contact your surgeon or physical therapist.  [facility] [phone]. [address], [place] [address]  ABC CLASS After Breast Cancer Class  After Breast Cancer Class is a specially designed exercise class to assist you in a safe recover after having breast cancer surgery.  In this class you will learn  how to get back to full function whether your drains were just removed or if you had surgery a month ago.  This one-time class is held the 1st and 3rd Monday of every month from 11:00 a.m. until 12:00 noon virtually.  This class is FREE and space is limited. For more information or to register for the next available class, call [phone].  Class Goals  Understand specific stretches to improve the flexibility of you chest and shoulder. Learn ways to safely strengthen your upper body and improve your posture. Understand the warning signs of infection and why you may be at risk for an arm infection. Learn about Lymphedema and prevention.  ** You do not attend this class until after surgery.  Drains must be removed to participate  Patient was instructed today in a home exercise program today for post op shoulder range of motion. These included active assist shoulder flexion in sitting, scapular retraction, wall walking with shoulder abduction, and hands behind head external rotation.  She was encouraged to do these twice a day, holding 3 seconds and repeating 5 times when permitted by her physician.  Annia Friendly, [place] 05/25/22 11:22  AM   

## 2022-05-25 NOTE — Progress Notes (Unsigned)
REFERRING PROVIDER: Benay Pike, MD [place], [place] [address]  PRIMARY PROVIDER:  [name], MD  PRIMARY REASON FOR VISIT:  Encounter Diagnoses  Name Primary?   Malignant neoplasm of upper-inner quadrant of left breast in female, estrogen receptor negative ([place]) Yes   Family history of breast cancer    Family history of ovarian cancer     HISTORY OF PRESENT ILLNESS:   Sydney Lee, a 61 y.o. female, was seen for a [place] cancer genetics consultation at the request of Dr. Chryl Heck due to a personal and family history of cancer.  Sydney Lee presents to clinic today to discuss the possibility of a hereditary predisposition to cancer, to discuss genetic testing, and to further clarify her future cancer risks, as well as potential cancer risks for family members.   In June 2023, at the age of 18, Sydney Lee was diagnosed with invasive ductal carcinoma of the left breast. The tumor is described as ER/PR/HER2 negative (triple negative breast cancer).  CANCER HISTORY:  Oncology History  Malignant neoplasm of upper-inner quadrant of left breast in female, estrogen receptor negative ([place])  04/27/2022 Mammogram   Diagnostic mammogram showed indeterminate solid mass in the 10:00 location of the left breast.  Small satellite nodule adjacent to the index mass.  Borderline lymph node has cortical thickening of 2.9 mm.  Other lymph nodes have normal morphology.   05/16/2022 Pathology Results   Left breast needle core biopsy showed invasive poorly differentiated adenocarcinoma, grade 3 with tumor necrosis, lymph node biopsy benign reactive, negative for carcinoma.  Prognostics from the tumor showed ER 0%, negative, PR 0%, negative, Ki-67 of 80% and HER2 negative   05/23/2022 Initial Diagnosis   Malignant neoplasm of upper-inner quadrant of left breast in female, estrogen receptor negative ([place])   05/25/2022 Cancer Staging   Staging form: Breast, AJCC 8th Edition - Clinical:  Stage IIB (cT2, cN0, cM0, G3, ER-, PR-, HER2-) - Signed by Benay Pike, MD on 05/25/2022 Stage prefix: Initial diagnosis Histologic grading system: 3 grade system   06/01/2022 -  Chemotherapy   Patient is on Treatment Plan : BREAST Pembrolizumab (200) D1 + Carboplatin (5) D1 + Paclitaxel (80) D1,8,15 q21d X 4 cycles / Pembrolizumab (200) D1 + AC D1 q21d x 4 cycles        RISK FACTORS:  Menarche was at age 74.  Ovaries intact: yes.  Uterus intact: yes.  Menopausal status: postmenopausal.  HRT use: 0 years. Colonoscopy: no Mammogram within the last year: yes. Any excessive radiation exposure in the past: no  Past Medical History:  Diagnosis Date   Anxiety    Diabetes mellitus without complication ([place])    Hypertension     No past surgical history on file.  Social History   Socioeconomic History   Marital status: Married    Spouse name: Not on file   Number of children: Not on file   Years of education: Not on file   Highest education level: Not on file  Occupational History   Not on file  Tobacco Use   Smoking status: Every Day    Packs/day: 1.00    Types: Cigarettes   Smokeless tobacco: Never  Substance and Sexual Activity   Alcohol use: No   Drug use: No   Sexual activity: Not on file  Other Topics Concern   Not on file  Social History Narrative   Not on file   Social Determinants of Health   Financial Resource Strain: Not  on file  Food Insecurity: Not on file  Transportation Needs: Not on file  Physical Activity: Not on file  Stress: Not on file  Social Connections: Not on file     FAMILY HISTORY:  We obtained a detailed, 4-generation family history.  Significant diagnoses are listed below: ***  ***  Sydney Lee is {aware/unaware} of previous family history of genetic testing for hereditary cancer risks. There is no reported Ashkenazi Jewish ancestry.   GENETIC COUNSELING ASSESSMENT: Sydney Lee is a 62 y.o. female with a personal and family  history of cancer which is somewhat suggestive of a hereditary predisposition to cancer. We, therefore, discussed and recommended the following at today's visit.   DISCUSSION: We discussed that 5 - 10% of cancer is hereditary, with most cases of breast cancer associated with BRCA1/2.  There are other genes that can be associated with hereditary breast cancer syndromes.  We discussed that testing is beneficial for several reasons including knowing how to follow individuals after completing their treatment, identifying whether potential treatment options would be beneficial, and understanding if other family members could be at risk for cancer and allowing them to undergo genetic testing.   We reviewed the characteristics, features and inheritance patterns of hereditary cancer syndromes. We also discussed genetic testing, including the appropriate family members to test, the process of testing, insurance coverage and turn-around-time for results. We discussed the implications of a negative, positive, carrier and/or variant of uncertain significant result. We recommended Sydney Lee pursue genetic testing for a panel that includes genes associated with breast, ovarian, and prostate cancer.   Sydney Lee elected to have [facility] CancerNext-Expanded Panel. The CancerNext-Expanded gene panel offered by [facility] and includes sequencing, rearrangement, and RNA analysis for the following 77 genes: AIP, ALK, APC, ATM, AXIN2, BAP1, BARD1, BLM, BMPR1A, BRCA1, BRCA2, BRIP1, CDC73, CDH1, CDK4, CDKN1B, CDKN2A, CHEK2, CTNNA1, DICER1, FANCC, FH, FLCN, GALNT12, KIF1B, LZTR1, MAX, MEN1, MET, MLH1, MSH2, MSH3, MSH6, MUTYH, NBN, NF1, NF2, NTHL1, PALB2, PHOX2B, PMS2, POT1, PRKAR1A, PTCH1, PTEN, RAD51C, RAD51D, RB1, RECQL, RET, SDHA, SDHAF2, SDHB, SDHC, SDHD, SMAD4, SMARCA4, SMARCB1, SMARCE1, STK11, SUFU, TMEM127, TP53, TSC1, TSC2, VHL and XRCC2 (sequencing and deletion/duplication); EGFR, EGLN1, HOXB13, KIT, MITF, PDGFRA, POLD1,  and POLE (sequencing only); EPCAM and GREM1 (deletion/duplication only). .  Based on Sydney Lee's personal and family history of cancer, she meets medical criteria for genetic testing. Despite that she meets criteria, she may still have an out of pocket cost. We discussed that if her out of pocket cost for testing is over $100, the laboratory will call and confirm whether she wants to proceed with testing.  If the out of pocket cost of testing is less than $100 she will be billed by the genetic testing laboratory.   PLAN: After considering the risks, benefits, and limitations, Sydney Lee provided informed consent to pursue genetic testing and the blood sample was sent to [facility] for analysis of the CancerNext-Expanded Panel. Results should be available within approximately 2-3 weeks' time, at which point they will be disclosed by telephone to Sydney Lee, as will any additional recommendations warranted by these results. Sydney Lee will receive a summary of her genetic counseling visit and a copy of her results once available. This information will also be available in Epic.   Sydney Lee questions were answered to her satisfaction today. Our contact information was provided should additional questions or concerns arise. Thank you for the referral and allowing [place] to share in the care of your  patient.   Sydney Passy, MS, [facility] Genetic Counselor [email or web address] (P) [phone]  The patient was seen for a total of 20 minutes in face-to-face genetic counseling.  The patient brought her daughter.  Drs. Lindi Adie and/or Burr Medico were available to discuss this case as needed.   _______________________________________________________________________ For Office Staff:  Number of people involved in session: 2 Was an Intern/ student involved with case: no

## 2022-05-26 ENCOUNTER — Telehealth: Payer: Self-pay | Admitting: *Deleted

## 2022-05-26 ENCOUNTER — Encounter: Payer: Self-pay | Admitting: *Deleted

## 2022-05-26 NOTE — Telephone Encounter (Signed)
Spoke to pt regarding [place] From 6.21.23. Denies questions or concerns regarding dx or treatment care plan. Encourage pt to call with needs.

## 2022-05-27 ENCOUNTER — Encounter: Payer: Self-pay | Admitting: Hematology and Oncology

## 2022-05-27 ENCOUNTER — Encounter: Payer: Self-pay | Admitting: Genetic Counselor

## 2022-05-30 ENCOUNTER — Encounter: Payer: Self-pay | Admitting: General Practice

## 2022-05-30 ENCOUNTER — Encounter (HOSPITAL_COMMUNITY): Payer: Self-pay | Admitting: General Surgery

## 2022-05-30 ENCOUNTER — Other Ambulatory Visit: Payer: Self-pay

## 2022-05-30 ENCOUNTER — Ambulatory Visit
Admission: RE | Admit: 2022-05-30 | Discharge: 2022-05-30 | Disposition: A | Discharge status: Home / Self Care | Payer: BC Managed Care – PPO | Source: Ambulatory Visit | Attending: Hematology and Oncology | Admitting: Hematology and Oncology

## 2022-05-30 DIAGNOSIS — C50212 Malignant neoplasm of upper-inner quadrant of left female breast: Secondary | ICD-10-CM

## 2022-05-30 MED ORDER — GADOBUTROL 1 MMOL/ML IV SOLN
10.0000 mL | Freq: Once | INTRAVENOUS | Status: AC | PRN
Start: 2022-05-30 — End: 2022-05-30
  Administered 2022-05-30: 10 mL via INTRAVENOUS

## 2022-05-30 NOTE — Progress Notes (Signed)
Pharmacist Chemotherapy Monitoring - Initial Assessment    Anticipated start date: 06/06/22   The following has been reviewed per standard work regarding the patient's treatment regimen: The patient's diagnosis, treatment plan and drug doses, and organ/hematologic function Lab orders and baseline tests specific to treatment regimen  The treatment plan start date, drug sequencing, and pre-medications Prior authorization status  Patient's documented medication list, including drug-drug interaction screen and prescriptions for anti-emetics and supportive care specific to the treatment regimen The drug concentrations, fluid compatibility, administration routes, and timing of the medications to be used The patient's access for treatment and lifetime cumulative dose history, if applicable  The patient's medication allergies and previous infusion related reactions, if applicable   Changes made to treatment plan:  N/A  Follow up needed:  N/A   Demetrius Charity, RPH, 05/30/2022  10:37 AM

## 2022-05-31 ENCOUNTER — Encounter: Payer: Self-pay | Admitting: General Practice

## 2022-05-31 NOTE — Progress Notes (Signed)
[facility] Spiritual Care Note  Left follow-up voicemail encouraging return call.   [place], [place], [facility] Pager [phone] Voicemail [phone]

## 2022-06-01 ENCOUNTER — Other Ambulatory Visit: Payer: Self-pay | Admitting: *Deleted

## 2022-06-01 ENCOUNTER — Ambulatory Visit (HOSPITAL_COMMUNITY): Payer: BC Managed Care – PPO

## 2022-06-01 ENCOUNTER — Encounter (HOSPITAL_COMMUNITY)
Admission: RE | Disposition: A | Discharge status: Home / Self Care | Payer: Self-pay | Source: Home / Self Care | Attending: General Surgery

## 2022-06-01 ENCOUNTER — Other Ambulatory Visit: Payer: BC Managed Care – PPO

## 2022-06-01 ENCOUNTER — Ambulatory Visit (HOSPITAL_COMMUNITY): Payer: BC Managed Care – PPO | Admitting: Anesthesiology

## 2022-06-01 ENCOUNTER — Encounter (HOSPITAL_COMMUNITY): Payer: Self-pay | Admitting: General Surgery

## 2022-06-01 ENCOUNTER — Ambulatory Visit (HOSPITAL_COMMUNITY)
Admission: RE | Admit: 2022-06-01 | Discharge: 2022-06-01 | Disposition: A | Discharge status: Home / Self Care | Payer: BC Managed Care – PPO | Attending: General Surgery | Admitting: General Surgery

## 2022-06-01 ENCOUNTER — Other Ambulatory Visit: Payer: Self-pay

## 2022-06-01 DIAGNOSIS — Z171 Estrogen receptor negative status [ER-]: Secondary | ICD-10-CM

## 2022-06-01 DIAGNOSIS — E119 Type 2 diabetes mellitus without complications: Secondary | ICD-10-CM | POA: Diagnosis not present

## 2022-06-01 DIAGNOSIS — F419 Anxiety disorder, unspecified: Secondary | ICD-10-CM | POA: Diagnosis not present

## 2022-06-01 DIAGNOSIS — I10 Essential (primary) hypertension: Secondary | ICD-10-CM | POA: Diagnosis not present

## 2022-06-01 DIAGNOSIS — F17210 Nicotine dependence, cigarettes, uncomplicated: Secondary | ICD-10-CM | POA: Diagnosis not present

## 2022-06-01 DIAGNOSIS — I2510 Atherosclerotic heart disease of native coronary artery without angina pectoris: Secondary | ICD-10-CM

## 2022-06-01 DIAGNOSIS — Z452 Encounter for adjustment and management of vascular access device: Secondary | ICD-10-CM | POA: Diagnosis not present

## 2022-06-01 DIAGNOSIS — Z7984 Long term (current) use of oral hypoglycemic drugs: Secondary | ICD-10-CM | POA: Diagnosis not present

## 2022-06-01 DIAGNOSIS — C50212 Malignant neoplasm of upper-inner quadrant of left female breast: Secondary | ICD-10-CM | POA: Insufficient documentation

## 2022-06-01 HISTORY — PX: PORTACATH PLACEMENT: SHX2246

## 2022-06-01 HISTORY — DX: Unspecified osteoarthritis, unspecified site: M19.90

## 2022-06-01 HISTORY — DX: Depression, unspecified: F32.A

## 2022-06-01 HISTORY — DX: Malignant neoplasm of unspecified site of unspecified female breast: C50.919

## 2022-06-01 LAB — GLUCOSE, CAPILLARY
Glucose-Capillary: 124 mg/dL — ABNORMAL HIGH (ref 70–99)
Glucose-Capillary: 120 mg/dL — ABNORMAL HIGH (ref 70–99)

## 2022-06-01 SURGERY — INSERTION, TUNNELED CENTRAL VENOUS DEVICE, WITH PORT
Anesthesia: General | Site: Chest

## 2022-06-01 MED ORDER — ORAL CARE MOUTH RINSE: 15.0000 mL | Freq: Once | OROMUCOSAL | Status: AC

## 2022-06-01 MED ORDER — BUPIVACAINE-EPINEPHRINE 0.25% -1:200000 IJ SOLN
INTRAMUSCULAR | Status: AC
  Filled 2022-06-01: qty 1

## 2022-06-01 MED ORDER — ONDANSETRON HCL 4 MG/2ML IJ SOLN: 4.0000 mg | Freq: Once | INTRAMUSCULAR | Status: DC | PRN

## 2022-06-01 MED ORDER — CHLORHEXIDINE GLUCONATE CLOTH 2 % EX PADS: 6.0000 | MEDICATED_PAD | Freq: Once | CUTANEOUS | Status: DC

## 2022-06-01 MED ORDER — HEPARIN SOD (PORK) LOCK FLUSH 100 UNIT/ML IV SOLN
INTRAVENOUS | Status: DC | PRN
  Administered 2022-06-01: 500 [IU] via INTRAVENOUS

## 2022-06-01 MED ORDER — OXYCODONE HCL 5 MG/5ML PO SOLN: 5.0000 mg | Freq: Once | ORAL | Status: DC | PRN

## 2022-06-01 MED ORDER — OXYCODONE HCL 5 MG PO TABS: 5.0000 mg | ORAL_TABLET | Freq: Four times a day (QID) | ORAL | 0 refills | Status: DC | PRN

## 2022-06-01 MED ORDER — ENSURE PRE-SURGERY PO LIQD: 296.0000 mL | Freq: Once | ORAL | Status: DC

## 2022-06-01 MED ORDER — CEFAZOLIN SODIUM-DEXTROSE 2-4 GM/100ML-% IV SOLN
2.0000 g | INTRAVENOUS | Status: AC
  Administered 2022-06-01: 2 g via INTRAVENOUS
  Filled 2022-06-01: qty 100

## 2022-06-01 MED ORDER — HEPARIN 6000 UNIT IRRIGATION SOLUTION
Status: DC | PRN
  Administered 2022-06-01: 1

## 2022-06-01 MED ORDER — PHENYLEPHRINE HCL-NACL 20-0.9 MG/250ML-% IV SOLN
INTRAVENOUS | Status: DC | PRN
  Administered 2022-06-01: 20 ug/min via INTRAVENOUS

## 2022-06-01 MED ORDER — DEXAMETHASONE SODIUM PHOSPHATE 10 MG/ML IJ SOLN
INTRAMUSCULAR | Status: AC
  Filled 2022-06-01: qty 1

## 2022-06-01 MED ORDER — PROPOFOL 10 MG/ML IV BOLUS
INTRAVENOUS | Status: AC
  Filled 2022-06-01: qty 20

## 2022-06-01 MED ORDER — ONDANSETRON HCL 4 MG/2ML IJ SOLN
INTRAMUSCULAR | Status: AC
  Filled 2022-06-01: qty 2

## 2022-06-01 MED ORDER — HEPARIN 6000 UNIT IRRIGATION SOLUTION
Freq: Once | Status: DC
  Filled 2022-06-01: qty 500

## 2022-06-01 MED ORDER — LIDOCAINE 2% (20 MG/ML) 5 ML SYRINGE
INTRAMUSCULAR | Status: DC | PRN
  Administered 2022-06-01: 100 mg via INTRAVENOUS

## 2022-06-01 MED ORDER — ACETAMINOPHEN 500 MG PO TABS
1000.0000 mg | ORAL_TABLET | ORAL | Status: AC
  Administered 2022-06-01: 1000 mg via ORAL
  Filled 2022-06-01: qty 2

## 2022-06-01 MED ORDER — BUPIVACAINE-EPINEPHRINE 0.25% -1:200000 IJ SOLN
INTRAMUSCULAR | Status: DC | PRN
  Administered 2022-06-01: 20 mL

## 2022-06-01 MED ORDER — LIDOCAINE HCL (PF) 1 % IJ SOLN
INTRAMUSCULAR | Status: AC
  Filled 2022-06-01: qty 30

## 2022-06-01 MED ORDER — ONDANSETRON HCL 4 MG/2ML IJ SOLN
INTRAMUSCULAR | Status: DC | PRN
  Administered 2022-06-01: 4 mg via INTRAVENOUS

## 2022-06-01 MED ORDER — OXYCODONE HCL 5 MG PO TABS: 5.0000 mg | ORAL_TABLET | Freq: Once | ORAL | Status: DC | PRN

## 2022-06-01 MED ORDER — LIDOCAINE-EPINEPHRINE (PF) 1 %-1:200000 IJ SOLN
INTRAMUSCULAR | Status: AC
  Filled 2022-06-01: qty 30

## 2022-06-01 MED ORDER — PROPOFOL 10 MG/ML IV BOLUS
INTRAVENOUS | Status: DC | PRN
  Administered 2022-06-01: 150 mg via INTRAVENOUS

## 2022-06-01 MED ORDER — LACTATED RINGERS IV SOLN: INTRAVENOUS | Status: DC

## 2022-06-01 MED ORDER — HEPARIN SOD (PORK) LOCK FLUSH 100 UNIT/ML IV SOLN
INTRAVENOUS | Status: AC
  Filled 2022-06-01: qty 5

## 2022-06-01 MED ORDER — DEXAMETHASONE SODIUM PHOSPHATE 10 MG/ML IJ SOLN
INTRAMUSCULAR | Status: DC | PRN
  Administered 2022-06-01: 4 mg via INTRAVENOUS

## 2022-06-01 MED ORDER — AMISULPRIDE (ANTIEMETIC) 5 MG/2ML IV SOLN: 10.0000 mg | Freq: Once | INTRAVENOUS | Status: DC | PRN

## 2022-06-01 MED ORDER — LIDOCAINE HCL 1 % IJ SOLN
INTRAMUSCULAR | Status: DC | PRN
  Administered 2022-06-01: 20 mL

## 2022-06-01 MED ORDER — FENTANYL CITRATE PF 50 MCG/ML IJ SOSY: 25.0000 ug | PREFILLED_SYRINGE | INTRAMUSCULAR | Status: DC | PRN

## 2022-06-01 MED ORDER — CHLORHEXIDINE GLUCONATE 0.12 % MT SOLN
15.0000 mL | Freq: Once | OROMUCOSAL | Status: AC
  Administered 2022-06-01: 15 mL via OROMUCOSAL

## 2022-06-01 SURGICAL SUPPLY — 34 items
BAG COUNTER SPONGE SURGICOUNT (BAG) ×1 IMPLANT
BAG DECANTER FOR FLEXI CONT (MISCELLANEOUS) ×2 IMPLANT
BLADE HEX COATED 2.75 (ELECTRODE) ×2 IMPLANT
BLADE SURG 15 STRL LF DISP TIS (BLADE) ×1 IMPLANT
BLADE SURG 15 STRL SS (BLADE) ×1
BLADE SURG SZ11 CARB STEEL (BLADE) ×2 IMPLANT
CHLORAPREP W/TINT 26 (MISCELLANEOUS) ×2 IMPLANT
COVER SURGICAL LIGHT HANDLE (MISCELLANEOUS) ×2 IMPLANT
DERMABOND ADVANCED (GAUZE/BANDAGES/DRESSINGS) ×1
DERMABOND ADVANCED .7 DNX12 (GAUZE/BANDAGES/DRESSINGS) ×1 IMPLANT
DRAPE C-ARM 42X120 X-RAY (DRAPES) ×2 IMPLANT
DRAPE LAPAROTOMY TRNSV 102X78 (DRAPES) ×2 IMPLANT
DRAPE UTILITY XL STRL (DRAPES) ×2 IMPLANT
ELECT REM PT RETURN 15FT ADLT (MISCELLANEOUS) ×2 IMPLANT
GAUZE 4X4 16PLY ~~LOC~~+RFID DBL (SPONGE) ×2 IMPLANT
GLOVE BIO SURGEON STRL SZ 6 (GLOVE) ×2 IMPLANT
GLOVE INDICATOR 6.5 STRL GRN (GLOVE) ×2 IMPLANT
GOWN SPEC L3 XXLG W/TWL (GOWN DISPOSABLE) ×2 IMPLANT
GOWN STRL REUS W/TWL 2XL LVL3 (GOWN DISPOSABLE) ×2 IMPLANT
KIT BASIN OR (CUSTOM PROCEDURE TRAY) ×2 IMPLANT
KIT PORT POWER 8FR ISP CVUE (Port) ×1 IMPLANT
KIT TURNOVER KIT A (KITS) IMPLANT
NEEDLE HYPO 22GX1.5 SAFETY (NEEDLE) ×2 IMPLANT
PACK BASIC VI WITH GOWN DISP (CUSTOM PROCEDURE TRAY) ×2 IMPLANT
PENCIL SMOKE EVACUATOR (MISCELLANEOUS) IMPLANT
SPIKE FLUID TRANSFER (MISCELLANEOUS) ×2 IMPLANT
SUT MNCRL AB 4-0 PS2 18 (SUTURE) ×2 IMPLANT
SUT PROLENE 2 0 SH DA (SUTURE) ×4 IMPLANT
SUT VIC AB 3-0 SH 27 (SUTURE) ×2
SUT VIC AB 3-0 SH 27X BRD (SUTURE) ×1 IMPLANT
SYR 10ML LL (SYRINGE) ×2 IMPLANT
SYR CONTROL 10ML LL (SYRINGE) ×2 IMPLANT
TOWEL OR 17X26 10 PK STRL BLUE (TOWEL DISPOSABLE) ×2 IMPLANT
TOWEL OR NON WOVEN STRL DISP B (DISPOSABLE) ×2 IMPLANT

## 2022-06-01 NOTE — Discharge Instructions (Addendum)
[place] Office Phone Number [phone]   POST OP INSTRUCTIONS  Always review your discharge instruction sheet given to you by the facility where your surgery was performed.  IF YOU HAVE DISABILITY OR FAMILY LEAVE FORMS, YOU MUST BRING THEM TO THE OFFICE FOR PROCESSING.  DO NOT GIVE THEM TO YOUR DOCTOR.  Take 2 tylenol (acetominophen) three times a day for 3 days.  If you still have pain, add ibuprofen with food in between if able to take this (if you have kidney issues or stomach issues, do not take ibuprofen).  If both of those are not enough, add the narcotic pain pill.  If you find you are needing a lot of this overnight after surgery, call the next morning for a refill.   Take your usually prescribed medications unless otherwise directed If you need a refill on your pain medication, please contact your pharmacy.  They will contact our office to request authorization.  Prescriptions will not be filled after 5pm or on week-ends. You should eat very light the first 24 hours after surgery, such as soup, crackers, pudding, etc.  Resume your normal diet the day after surgery It is common to experience some constipation if taking pain medication after surgery.  Increasing fluid intake and taking a stool softener will usually help or prevent this problem from occurring.  A mild laxative (Milk of Magnesia or Miralax) should be taken according to package directions if there are no bowel movements after 48 hours. You may shower in 48 hours.  The surgical glue will flake off in 2-3 weeks.   ACTIVITIES:  No strenuous activity or heavy lifting over 10 pounds for 1 week.   You may drive when you no longer are taking prescription pain medication, you can comfortably wear a seatbelt, and you can safely maneuver your car and apply brakes. RETURN TO WORK:  __________to be determined_______________ Dennis Bast should see your doctor in the office for a follow-up appointment approximately three-four  weeks after your surgery.    WHEN TO CALL YOUR DOCTOR: Fever over 101.0 Nausea and/or vomiting. Extreme swelling or bruising. Continued bleeding from incision. Increased pain, redness, or drainage from the incision.  The clinic staff is available to answer your questions during regular business hours.  Please don't hesitate to call and ask to speak to one of the nurses for clinical concerns.  If you have a medical emergency, go to the nearest emergency room or call 911.  A surgeon from [facility] is always on call at the hospital.  For further questions, please visit [email or web address]

## 2022-06-01 NOTE — Anesthesia Procedure Notes (Signed)
Procedure Name: LMA Insertion Date/Time: 06/01/2022 10:00 AM  Performed by: Milford Cage, CRNAPre-anesthesia Checklist: Patient identified, Emergency Drugs available, Suction available and Patient being monitored Patient Re-evaluated:Patient Re-evaluated prior to induction Oxygen Delivery Method: Circle system utilized Preoxygenation: Pre-oxygenation with 100% oxygen Induction Type: IV induction Ventilation: Mask ventilation without difficulty LMA: LMA with gastric port inserted and LMA inserted LMA Size: 4.0 Number of attempts: 1 Tube secured with: Tape Dental Injury: Teeth and Oropharynx as per pre-operative assessment

## 2022-06-01 NOTE — Transfer of Care (Signed)
Immediate Anesthesia Transfer of Care Note  Patient: Sydney Lee  Procedure(s) Performed: INSERTION PORT-A-CATH WITH ULTRASOUND GUIDANCE (Chest)  Patient Location: PACU  Anesthesia Type:General  Level of Consciousness: awake  Airway & Oxygen Therapy: Patient Spontanous Breathing and Patient connected to face mask oxygen  Post-op Assessment: Report given to RN and Post -op Vital signs reviewed and stable  Post vital signs: Reviewed and stable  Last Vitals:  Vitals Value Taken Time  BP 145/74 06/01/22 1151  Temp    Pulse 87 06/01/22 1152  Resp 27 06/01/22 1152  SpO2 97 % 06/01/22 1152  Vitals shown include unvalidated device data.  Last Pain:  Vitals:   06/01/22 0843  TempSrc:   PainSc: 0-No pain         Complications: No notable events documented.

## 2022-06-01 NOTE — Anesthesia Postprocedure Evaluation (Signed)
Anesthesia Post Note  Patient: Sydney Lee  Procedure(s) Performed: INSERTION PORT-A-CATH WITH ULTRASOUND GUIDANCE (Chest)     Patient location during evaluation: PACU Anesthesia Type: General Level of consciousness: awake and alert Pain management: pain level controlled Vital Signs Assessment: post-procedure vital signs reviewed and stable Respiratory status: spontaneous breathing, nonlabored ventilation and respiratory function stable Cardiovascular status: blood pressure returned to baseline and stable Postop Assessment: no apparent nausea or vomiting Anesthetic complications: no   No notable events documented.  Last Vitals:  Vitals:   06/01/22 1215 06/01/22 1239  BP: 120/74 116/80  Pulse: 69 62  Resp: 18 20  Temp:  36.7 C  SpO2: 96% 95%    Last Pain:  Vitals:   06/01/22 1239  TempSrc: Oral  PainSc: 0-No pain                 Lidia Collum

## 2022-06-01 NOTE — H&P (Signed)
REFERRING PHYSICIAN: Lajean Manes, MD Care team   Patient Care Team: Maggie Font, MD as PCP - General (Family Medicine) Barry Dienes, Ballard Russell, MD as Consulting Provider (Surgical Oncology) Blair Promise, MD (Radiation Oncology) Iruku, Flo Shanks, MD (Hematology and Oncology)  PROVIDER: Georgianne Fick, MD  Care Team: Patient Care Team: Maggie Font, MD as PCP - General (Family Medicine) Georgianne Fick, MD as Consulting Provider (Surgical Oncology) Blair Promise, MD (Radiation Oncology) Iruku, Flo Shanks, MD (Hematology and Oncology)   MRN: [id number] DOB: 11-22-1961 DATE OF ENCOUNTER: 05/25/2022  Subjective   Chief Complaint: Breast Cancer   History of Present Illness: Sydney Lee is a 61 y.o. female who is seen today as an office consultation at the request of Dr. Imaging for evaluation of Breast Cancer .   Pt presents with new diagnosis of left breast cancer 05/2022. She had a palpable mass "pop up out of nowhere" around 1-2 months ago. She was able to get dx mammo and ultrasound which showed a 2.9 cm dominant mass at 10 o'clock with a 0.8 cm satellite mass also at 10 o'clock. There was also an abnormal appearing lymph node.   The dominant mass and the node were biopsied. 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   Family cancer history none Menarche 89 Menopause 71 Parity 2  Work: Air cabin crew on school mus  Diagnostic mammogram/us BCG: 04/27/2022 COMPARISON: 05/07/2010   ACR Breast Density Category b: There are scattered areas of  fibroglandular density.   FINDINGS:  RIGHT BREAST:   Mammogram: No suspicious mass, distortion, or microcalcifications  are identified to suggest presence of malignancy. Mammographic  images were processed with CAD.   LEFT BREAST:   Mammogram: There is an oval mass with indistinct margins in the  MEDIAL aspect of the LEFT breast  corresponding to the palpable  abnormality. Mass is estimated to measure at least 2.3 centimeters.  Just posterior to the mass, a 0.6 centimeter possible satellite is  present. Mammographic images were processed with CAD.   Physical Exam: I palpate a discrete mobile firm mass in the 10  o'clock location of the LEFT breast. There is no overlying  ecchymosis.   Ultrasound: Targeted ultrasound is performed, showing an oval  hypoechoic mass with angular margins and internal blood flow in the  10 o'clock location of the LEFT breast 25 centimeters from the  nipple. Mass measures 2.9 x 1.7 x 2.2 centimeters.   In the 10 o'clock location 26 centimeters from nipple, a small oval  satellite mass is 0.6 x 0.7 x 0.8 centimeters. Measured together,  these lesions are 3.9 centimeters. In this region, there is  parenchymal in skin thickening.   Evaluation of the LEFT axilla demonstrates 3 abnormal lymph nodes  with cortical thickening up to 3.6 millimeters. Borderline lymph  node has cortical thickening of 2.9 millimeters. Other lymph nodes  have normal morphology.   IMPRESSION:  Indeterminate solid mass in the 10 o'clock location of the LEFT  breast.   Small satellite nodule adjacent to the index mass.   At least three abnormal LEFT axillary lymph nodes. Single borderline  LEFT axillary lymph node.   RECOMMENDATION:  Recommend ultrasound-guided core biopsy of the 2 masses in the 10  o'clock location of the LEFT breast.   Recommend ultrasound-guided core biopsy of 1 of the abnormal LEFT  axillary lymph nodes.   I have discussed the findings and  recommendations with the patient.  If applicable, a reminder letter will be sent to the patient  regarding the next appointment.   BI-RADS CATEGORY 5: Highly suggestive of malignancy.   Pathology core needle biopsy: 05/16/2022 1. Breast, left, needle core biopsy, 10 o'clock, 25 cmfn, ribbon clip INVASIVE POORLY DIFFERENTIATED DUCTAL  ADENOCARCINOMA, GRADE 3 (3+3+3) WITH TUMOR NECROSIS 2. Lymph node, needle/core biopsy, left axilla, hydromark clip COMPATIBLE WITH A BENIGN REACTIVE LYMPH NODE NEGATIVE FOR CARCINOMA  Receptors: The tumor cells are NEGATIVE for Her2 (1+). Estrogen Receptor: 0%, NEGATIVE Progesterone Receptor: 0%, NEGATIVE Proliferation Marker Ki67: 80%  Review of Systems: A complete review of systems was obtained from the patient. I have reviewed this information and discussed as appropriate with the patient. See HPI as well for other ROS.  ROS: anxiety  Medical History: Past Medical History:  Diagnosis Date  Anxiety  Diabetes mellitus without complication (CMS-HCC)  Hyperlipidemia  Hypertension   Patient Active Problem List  Diagnosis  Malignant neoplasm of upper-inner quadrant of left breast in female, estrogen receptor negative (CMS-HCC)  Itching  Nervousness  Anxiousness   No past surgical history on file.   Allergies  Allergen Reactions  Codeine Other (See Comments)  "Makes me sick to my stomach"   Current Outpatient Medications on File Prior to Visit  Medication Sig Dispense Refill  ALPRAZolam (XANAX) 0.25 MG tablet Take 0.25 mg by mouth 2 (two) times daily  atorvastatin (LIPITOR) 10 MG tablet Take 10 mg by mouth every Monday, Wednesday, and Friday  JARDIANCE 10 mg tablet Take 10 mg by mouth once daily  losartan (COZAAR) 100 MG tablet Take 1 tablet by mouth 2 (two) times daily  meclizine (ANTIVERT) 25 mg tablet Take 25 mg by mouth once a week  metFORMIN (GLUCOPHAGE) 1000 MG tablet Take 1,000 mg by mouth 2 (two) times daily  semaglutide (OZEMPIC) 1 mg/dose (4 mg/3 mL) pen injector Inject 1 mg subcutaneously once a week   No current facility-administered medications on file prior to visit.   Family History  Problem Relation Age of Onset  High blood pressure (Hypertension) Mother  Coronary Artery Disease (Blocked arteries around heart) Brother  Hyperlipidemia (Elevated  cholesterol) Brother  Diabetes Maternal Aunt    Social History   Tobacco Use  Smoking Status Every Day  Packs/day: 3.00  Types: Cigarettes  Smokeless Tobacco Never    Social History   Socioeconomic History  Marital status: Single  Tobacco Use  Smoking status: Every Day  Packs/day: 3.00  Types: Cigarettes  Smokeless tobacco: Never  Vaping Use  Vaping Use: Unknown  Substance and Sexual Activity  Alcohol use: Never  Drug use: Never  Sexual activity: Defer   Objective:   Vitals:  05/25/22 1203  BP: 119/74  Pulse: 84  Resp: 18  Temp: 36.5 C (97.7 F)  Weight: 100.3 kg (221 lb 3.2 oz)  Height: 175.3 cm ('5\' 9"' )   Body mass index is 32.67 kg/m.  Gen: No acute distress. Well nourished and well groomed.  Neurological: Alert and oriented to person, place, and time. Coordination normal.  Head: Normocephalic and atraumatic.  Eyes: Conjunctivae are normal. Pupils are equal, round, and reactive to light. No scleral icterus.  Neck: Normal range of motion. Neck supple. No tracheal deviation or thyromegaly present.  Cardiovascular: Normal rate, regular rhythm, normal heart sounds and intact distal pulses. Exam reveals no gallop and no friction rub. No murmur heard. Breast: 3-4 cm palpable mass upper inner left breast. Bruising on skin overlying the mass.  Pt says the redness was not there previously. Mobile. Palpable node on that side. No nipple retraction or skin dimpling. Ptotic bilaterally. Approximately symmetric size.  Respiratory: Effort normal. No respiratory distress. No chest wall tenderness. Breath sounds normal. No wheezes, rales or rhonchi.  GI: Soft. Bowel sounds are normal. The abdomen is soft and nontender. There is no rebound and no guarding.  Musculoskeletal: Normal range of motion. Extremities are nontender.  Lymphadenopathy: No cervical, preauricular, postauricular or axillary adenopathy is present Skin: Skin is warm and dry. No rash noted. No diaphoresis. No  erythema. No pallor. No clubbing, cyanosis, or edema.  Psychiatric: Normal mood and affect. Behavior is normal. Judgment and thought content normal.   Labs 05/25/22 CBC nl, CMET nl other than gluc 159  Assessment and Plan:   ICD-10-CM  1. Malignant neoplasm of upper-inner quadrant of left breast in female, estrogen receptor negative (CMS-HCC) C50.212  Z17.1    Patient has a new diagnosis of clinical T2N0 left breast cancer. Given the triple negative nature of this as well as the size, we will proceed with neoadjuvant chemotherapy followed by likely breast conservation. We will get an MRI to ensure there is no other things that need to be addressed.  I discussed port placement with the patient. I reviewed risk of malposition, malfunction, pneumothorax, blood clot, infection, heart or lung issues.   I also discussed briefly the process of lumpectomy with sentinel lymph node biopsy. We will see if this previously biopsied lymph node appears malignant on MRI. If so, we may target this with a seed to excise it at the time of definitive surgery.  Chemotherapy would be followed by surgery and then adjuvant radiation. She will not need any antihormone treatment given the triple negative nature of her cancer.  I do think given the proximity of the mass to the skin that portion of the skin will need to be removed. This will cause displacement of her nipple. Given her ptosis, I do not think that it will be bothersome positioning, but she may need a symmetry operation on the opposite side.  Milus Height, MD FACS Surgical Oncology, General Surgery, Trauma and [facility] [place]

## 2022-06-01 NOTE — Interval H&P Note (Signed)
History and Physical Interval Note:  06/01/2022 9:21 AM  Sydney Lee  has presented today for surgery, with the diagnosis of LEFT BREAST CANCER.  The various methods of treatment have been discussed with the patient and family. After consideration of risks, benefits and other options for treatment, the patient has consented to  Procedure(s): INSERTION PORT-A-CATH WITH ULTRASOUND GUIDANCE (N/A) as a surgical intervention.  The patient's history has been reviewed, patient examined, no change in status, stable for surgery.  I have reviewed the patient's chart and labs.  Questions were answered to the patient's satisfaction.     Stark Klein

## 2022-06-01 NOTE — Op Note (Signed)
PREOPERATIVE DIAGNOSIS:  left breast cancer     POSTOPERATIVE DIAGNOSIS:  Same     PROCEDURE: Left subclavian port placement, Bard ClearVuePower Port, MRI safe, 8-French.      SURGEON:  Stark Klein, MD      ANESTHESIA:  General   FINDINGS:  Good venous return, easy flush, and tip of the catheter and   SVC 23.5 cm.      SPECIMEN:  None.      ESTIMATED BLOOD LOSS:  Minimal.      COMPLICATIONS:  None known.      PROCEDURE:  Pt was identified in the holding area and taken to   the operating room, where patient was placed supine on the operating room   table.  General anesthesia was induced.  Patient's arms were tucked and the upper   chest and neck were prepped and draped in sterile fashion.  Time-out was   performed according to the surgical safety check list.  When all was   correct, we continued.   Local anesthetic was administered over this   area at the angle of the clavicle.  The vein was accessed with 1 pass(es) of the needle. There was good venous return and the wire passed easily with no ectopy.   Fluoroscopy was used to confirm that the wire was in the vena cava.      The patient was placed back level and the area for the pocket was anethetized   with local anesthetic.  A 3-cm transverse incision was made with a #15   blade.  Cautery was used to divide the subcutaneous tissues down to the   pectoralis muscle.  An [employer] retractor was used to elevate the skin   while a pocket was created on top of the pectoralis fascia.  The port   was placed into the pocket to confirm that it was of adequate size.  The   catheter was preattached to the port.  The port was then secured to the   pectoralis fascia with four 2-0 Prolene sutures.  These were clamped and   not tied down yet.    The catheter was tunneled through to the wire exit   site.  The catheter was placed along the wire to determine what length it should be to be in the SVC.  The catheter was cut at 23.5 cm.  The  tunneler sheath and dilator were passed over the wire and the dilator and wire were removed.  The catheter was advanced through the tunneler sheath and the tunneler sheath was pulled away.  Care was taken to keep the catheter in the tunneler sheath as this occurred. This was advanced and the tunneler sheath was removed.  There was good venous   return and easy flush of the catheter.  The Prolene sutures were tied   down to the pectoral fascia.  The skin was reapproximated using 3-0   Vicryl interrupted deep dermal sutures.    Fluoroscopy was used to re-confirm good position of the catheter.  The skin   was then closed using 4-0 Monocryl in a subcuticular fashion.  The port was flushed with concentrated heparin flush as well.  The wounds were then cleaned, dried, and dressed with Dermabond.  The patient was awakened from anesthesia and taken to the PACU in stable condition.  Needle, sponge, and instrument counts were correct.               Stark Klein, MD

## 2022-06-01 NOTE — Anesthesia Preprocedure Evaluation (Signed)
Anesthesia Evaluation  Patient identified by MRN, date of birth, ID band Patient awake    Reviewed: Allergy & Precautions, NPO status , Patient's Chart, lab work & pertinent test results  History of Anesthesia Complications Negative for: history of anesthetic complications  Airway Mallampati: II  TM Distance: >3 FB Neck ROM: Full    Dental  (+) Dental Advisory Given   Pulmonary neg pulmonary ROS, Current Smoker and Patient abstained from smoking.,    Pulmonary exam normal        Cardiovascular hypertension, Normal cardiovascular exam     Neuro/Psych Anxiety Depression negative neurological ROS     GI/Hepatic negative GI ROS, Neg liver ROS,   Endo/Other  diabetes, Oral Hypoglycemic Agents  Renal/GU negative Renal ROS  negative genitourinary   Musculoskeletal  (+) Arthritis ,   Abdominal   Peds  Hematology negative hematology ROS (+)   Anesthesia Other Findings L breast cancer  Reproductive/Obstetrics                             Anesthesia Physical Anesthesia Plan  ASA: 2  Anesthesia Plan: General   Post-op Pain Management: Tylenol PO (pre-op)* and Toradol IV (intra-op)*   Induction: Intravenous  PONV Risk Score and Plan: 2 and Ondansetron, Dexamethasone, Midazolam and Treatment may vary due to age or medical condition  Airway Management Planned: LMA  Additional Equipment: None  Intra-op Plan:   Post-operative Plan: Extubation in OR  Informed Consent: I have reviewed the patients History and Physical, chart, labs and discussed the procedure including the risks, benefits and alternatives for the proposed anesthesia with the patient or authorized representative who has indicated his/her understanding and acceptance.     Dental advisory given  Plan Discussed with:   Anesthesia Plan Comments:         Anesthesia Quick Evaluation

## 2022-06-02 ENCOUNTER — Ambulatory Visit
Admission: RE | Admit: 2022-06-02 | Discharge: 2022-06-02 | Disposition: A | Discharge status: Home / Self Care | Payer: BC Managed Care – PPO | Source: Ambulatory Visit | Attending: Hematology and Oncology | Admitting: Hematology and Oncology

## 2022-06-02 ENCOUNTER — Encounter: Payer: Self-pay | Admitting: *Deleted

## 2022-06-02 ENCOUNTER — Inpatient Hospital Stay: Payer: BC Managed Care – PPO

## 2022-06-02 ENCOUNTER — Telehealth: Payer: Self-pay | Admitting: *Deleted

## 2022-06-02 ENCOUNTER — Other Ambulatory Visit (HOSPITAL_COMMUNITY): Payer: Self-pay | Admitting: Diagnostic Radiology

## 2022-06-02 ENCOUNTER — Ambulatory Visit (HOSPITAL_COMMUNITY)
Admission: RE | Admit: 2022-06-02 | Discharge: 2022-06-02 | Disposition: A | Discharge status: Home / Self Care | Payer: BC Managed Care – PPO | Source: Ambulatory Visit | Attending: Hematology and Oncology | Admitting: Hematology and Oncology

## 2022-06-02 ENCOUNTER — Other Ambulatory Visit: Payer: Self-pay | Admitting: Hematology and Oncology

## 2022-06-02 ENCOUNTER — Encounter (HOSPITAL_COMMUNITY): Payer: Self-pay | Admitting: General Surgery

## 2022-06-02 ENCOUNTER — Other Ambulatory Visit: Payer: Self-pay

## 2022-06-02 DIAGNOSIS — Z0189 Encounter for other specified special examinations: Secondary | ICD-10-CM

## 2022-06-02 DIAGNOSIS — C50212 Malignant neoplasm of upper-inner quadrant of left female breast: Secondary | ICD-10-CM

## 2022-06-02 DIAGNOSIS — R599 Enlarged lymph nodes, unspecified: Secondary | ICD-10-CM

## 2022-06-02 DIAGNOSIS — Z01818 Encounter for other preprocedural examination: Secondary | ICD-10-CM | POA: Diagnosis present

## 2022-06-02 DIAGNOSIS — Z171 Estrogen receptor negative status [ER-]: Secondary | ICD-10-CM | POA: Insufficient documentation

## 2022-06-02 LAB — ECHOCARDIOGRAM COMPLETE
AR max vel: 2.74 cm2
AV Peak grad: 6.2 mmHg
Ao pk vel: 1.24 m/s
Area-P 1/2: 3.97 cm2
S' Lateral: 2.5 cm

## 2022-06-02 LAB — HEMOGLOBIN A1C
Hgb A1c MFr Bld: 7.1 % — ABNORMAL HIGH (ref 4.8–5.6)
Mean Plasma Glucose: 157 mg/dL

## 2022-06-02 MED ORDER — PROCHLORPERAZINE MALEATE 10 MG PO TABS: 10.0000 mg | ORAL_TABLET | Freq: Four times a day (QID) | ORAL | 1 refills | Status: DC | PRN

## 2022-06-02 MED ORDER — ONDANSETRON HCL 8 MG PO TABS: 8.0000 mg | ORAL_TABLET | Freq: Two times a day (BID) | ORAL | 1 refills | Status: DC | PRN

## 2022-06-02 MED ORDER — DEXAMETHASONE 4 MG PO TABS: ORAL_TABLET | ORAL | 1 refills | Status: DC

## 2022-06-02 MED ORDER — LIDOCAINE-PRILOCAINE 2.5-2.5 % EX CREA: TOPICAL_CREAM | CUTANEOUS | 3 refills | Status: DC

## 2022-06-02 NOTE — Telephone Encounter (Signed)
Spoke with patient to discuss MRI results and the need for an add U/S and possible bx of left axilla.  Patient verbalized understanding. Reviewed upcoming appts as well.

## 2022-06-03 ENCOUNTER — Encounter: Payer: Self-pay | Admitting: *Deleted

## 2022-06-03 ENCOUNTER — Encounter: Payer: Self-pay | Admitting: Genetic Counselor

## 2022-06-03 ENCOUNTER — Telehealth: Payer: Self-pay | Admitting: Genetic Counselor

## 2022-06-03 DIAGNOSIS — Z1379 Encounter for other screening for genetic and chromosomal anomalies: Secondary | ICD-10-CM | POA: Insufficient documentation

## 2022-06-03 MED FILL — Fosaprepitant Dimeglumine For IV Infusion 150 MG (Base Eq): INTRAVENOUS | Qty: 5 | Status: AC

## 2022-06-03 MED FILL — Dexamethasone Sodium Phosphate Inj 100 MG/10ML: INTRAMUSCULAR | Qty: 1 | Status: AC

## 2022-06-03 NOTE — Telephone Encounter (Signed)
I contacted Ms. Wronski to discuss her genetic testing results. No pathogenic variants were identified in the 77 genes analyzed. Detailed clinic note to follow.  The test report has been scanned into EPIC and is located under the Molecular Pathology section of the Results Review tab.  A portion of the result report is included below for reference.   Lucille Passy, MS, [facility] Genetic Counselor [email or web address] (P) [phone]

## 2022-06-06 ENCOUNTER — Encounter: Payer: Self-pay | Admitting: *Deleted

## 2022-06-06 ENCOUNTER — Inpatient Hospital Stay: Payer: BC Managed Care – PPO | Attending: Hematology and Oncology

## 2022-06-06 ENCOUNTER — Inpatient Hospital Stay: Payer: BC Managed Care – PPO

## 2022-06-06 ENCOUNTER — Encounter: Payer: Self-pay | Admitting: Physician Assistant

## 2022-06-06 ENCOUNTER — Encounter: Payer: Self-pay | Admitting: Adult Health

## 2022-06-06 ENCOUNTER — Other Ambulatory Visit: Payer: Self-pay

## 2022-06-06 ENCOUNTER — Encounter: Payer: Self-pay | Admitting: General Practice

## 2022-06-06 ENCOUNTER — Inpatient Hospital Stay (HOSPITAL_BASED_OUTPATIENT_CLINIC_OR_DEPARTMENT_OTHER): Payer: BC Managed Care – PPO | Admitting: Adult Health

## 2022-06-06 ENCOUNTER — Ambulatory Visit: Payer: Self-pay | Admitting: Genetic Counselor

## 2022-06-06 ENCOUNTER — Inpatient Hospital Stay (HOSPITAL_BASED_OUTPATIENT_CLINIC_OR_DEPARTMENT_OTHER): Payer: BC Managed Care – PPO | Admitting: Physician Assistant

## 2022-06-06 VITALS — BP 111/75 | HR 82 | Temp 97.8°F | Resp 18 | Ht 69.0 in | Wt 220.1 lb

## 2022-06-06 VITALS — BP 102/62 | HR 70 | Temp 98.1°F | Resp 18

## 2022-06-06 DIAGNOSIS — Z79899 Other long term (current) drug therapy: Secondary | ICD-10-CM | POA: Insufficient documentation

## 2022-06-06 DIAGNOSIS — Z171 Estrogen receptor negative status [ER-]: Secondary | ICD-10-CM | POA: Insufficient documentation

## 2022-06-06 DIAGNOSIS — M545 Low back pain, unspecified: Secondary | ICD-10-CM | POA: Diagnosis not present

## 2022-06-06 DIAGNOSIS — R5383 Other fatigue: Secondary | ICD-10-CM | POA: Diagnosis not present

## 2022-06-06 DIAGNOSIS — E119 Type 2 diabetes mellitus without complications: Secondary | ICD-10-CM | POA: Diagnosis not present

## 2022-06-06 DIAGNOSIS — Z95828 Presence of other vascular implants and grafts: Secondary | ICD-10-CM

## 2022-06-06 DIAGNOSIS — Z5112 Encounter for antineoplastic immunotherapy: Secondary | ICD-10-CM | POA: Insufficient documentation

## 2022-06-06 DIAGNOSIS — Z5111 Encounter for antineoplastic chemotherapy: Secondary | ICD-10-CM | POA: Insufficient documentation

## 2022-06-06 DIAGNOSIS — G8929 Other chronic pain: Secondary | ICD-10-CM | POA: Diagnosis not present

## 2022-06-06 DIAGNOSIS — Z5189 Encounter for other specified aftercare: Secondary | ICD-10-CM | POA: Insufficient documentation

## 2022-06-06 DIAGNOSIS — C50212 Malignant neoplasm of upper-inner quadrant of left female breast: Secondary | ICD-10-CM

## 2022-06-06 DIAGNOSIS — R0602 Shortness of breath: Secondary | ICD-10-CM

## 2022-06-06 DIAGNOSIS — T8090XA Unspecified complication following infusion and therapeutic injection, initial encounter: Secondary | ICD-10-CM

## 2022-06-06 DIAGNOSIS — Z1379 Encounter for other screening for genetic and chromosomal anomalies: Secondary | ICD-10-CM

## 2022-06-06 LAB — CMP (CANCER CENTER ONLY)
ALT: 15 U/L (ref 0–44)
AST: 12 U/L — ABNORMAL LOW (ref 15–41)
Albumin: 3.9 g/dL (ref 3.5–5.0)
Alkaline Phosphatase: 88 U/L (ref 38–126)
Anion gap: 5 (ref 5–15)
BUN: 12 mg/dL (ref 8–23)
CO2: 30 mmol/L (ref 22–32)
Calcium: 9.1 mg/dL (ref 8.9–10.3)
Chloride: 103 mmol/L (ref 98–111)
Creatinine: 0.68 mg/dL (ref 0.44–1.00)
GFR, Estimated: >60 mL/min (ref >60)
Glucose, Bld: 156 mg/dL — ABNORMAL HIGH (ref 70–99)
Potassium: 3.9 mmol/L (ref 3.5–5.1)
Sodium: 138 mmol/L (ref 135–145)
Total Bilirubin: 0.3 mg/dL (ref 0.3–1.2)
Total Protein: 7.4 g/dL (ref 6.5–8.1)

## 2022-06-06 LAB — CBC WITH DIFFERENTIAL (CANCER CENTER ONLY)
Abs Immature Granulocytes: 0.01 10*3/uL (ref 0.00–0.07)
Basophils Absolute: 0 10*3/uL (ref 0.0–0.1)
Basophils Relative: 1 %
Eosinophils Absolute: 0.2 10*3/uL (ref 0.0–0.5)
Eosinophils Relative: 3 %
HCT: 39.2 % (ref 36.0–46.0)
Hemoglobin: 13.3 g/dL (ref 12.0–15.0)
Immature Granulocytes: 0 %
Lymphocytes Relative: 41 %
Lymphs Abs: 2.7 10*3/uL (ref 0.7–4.0)
MCH: 30 pg (ref 26.0–34.0)
MCHC: 33.9 g/dL (ref 30.0–36.0)
MCV: 88.3 fL (ref 80.0–100.0)
Monocytes Absolute: 0.5 10*3/uL (ref 0.1–1.0)
Monocytes Relative: 7 %
Neutro Abs: 3.2 10*3/uL (ref 1.7–7.7)
Neutrophils Relative %: 48 %
Platelet Count: 268 10*3/uL (ref 150–400)
RBC: 4.44 MIL/uL (ref 3.87–5.11)
RDW: 13.4 % (ref 11.5–15.5)
WBC Count: 6.5 10*3/uL (ref 4.0–10.5)
nRBC: 0 % (ref 0.0–0.2)

## 2022-06-06 LAB — TSH: TSH: 0.944 u[IU]/mL (ref 0.350–4.500)

## 2022-06-06 LAB — RESEARCH LABS

## 2022-06-06 MED ORDER — HEPARIN SOD (PORK) LOCK FLUSH 100 UNIT/ML IV SOLN
500.0000 [IU] | Freq: Once | INTRAVENOUS | Status: AC | PRN
  Administered 2022-06-06: 500 [IU]

## 2022-06-06 MED ORDER — SODIUM CHLORIDE 0.9 % IV SOLN: Freq: Once | INTRAVENOUS | Status: AC

## 2022-06-06 MED ORDER — SODIUM CHLORIDE 0.9 % IV SOLN
702.0000 mg | Freq: Once | INTRAVENOUS | Status: AC
  Administered 2022-06-06: 700 mg via INTRAVENOUS
  Filled 2022-06-06: qty 70

## 2022-06-06 MED ORDER — SODIUM CHLORIDE 0.9 % IV SOLN: Freq: Once | INTRAVENOUS | Status: DC | PRN

## 2022-06-06 MED ORDER — SODIUM CHLORIDE 0.9 % IV SOLN
10.0000 mg | Freq: Once | INTRAVENOUS | Status: AC
  Administered 2022-06-06: 10 mg via INTRAVENOUS
  Filled 2022-06-06: qty 10

## 2022-06-06 MED ORDER — SODIUM CHLORIDE 0.9 % IV SOLN
200.0000 mg | Freq: Once | INTRAVENOUS | Status: AC
  Administered 2022-06-06: 200 mg via INTRAVENOUS
  Filled 2022-06-06: qty 200

## 2022-06-06 MED ORDER — PALONOSETRON HCL INJECTION 0.25 MG/5ML
0.2500 mg | Freq: Once | INTRAVENOUS | Status: AC
  Administered 2022-06-06: 0.25 mg via INTRAVENOUS
  Filled 2022-06-06: qty 5

## 2022-06-06 MED ORDER — SODIUM CHLORIDE 0.9% FLUSH
10.0000 mL | INTRAVENOUS | Status: DC | PRN
  Administered 2022-06-06: 10 mL

## 2022-06-06 MED ORDER — SODIUM CHLORIDE 0.9 % IV SOLN
150.0000 mg | Freq: Once | INTRAVENOUS | Status: AC
  Administered 2022-06-06: 150 mg via INTRAVENOUS
  Filled 2022-06-06: qty 150

## 2022-06-06 MED ORDER — SODIUM CHLORIDE 0.9 % IV SOLN
80.0000 mg/m2 | Freq: Once | INTRAVENOUS | Status: AC
  Administered 2022-06-06: 174 mg via INTRAVENOUS
  Filled 2022-06-06: qty 29

## 2022-06-06 MED ORDER — METHYLPREDNISOLONE SODIUM SUCC 125 MG IJ SOLR
125.0000 mg | Freq: Once | INTRAMUSCULAR | Status: AC | PRN
  Administered 2022-06-06: 125 mg via INTRAVENOUS

## 2022-06-06 MED ORDER — SODIUM CHLORIDE 0.9% FLUSH
10.0000 mL | Freq: Once | INTRAVENOUS | Status: AC
  Administered 2022-06-06: 10 mL

## 2022-06-06 MED ORDER — FAMOTIDINE IN NACL 20-0.9 MG/50ML-% IV SOLN
20.0000 mg | Freq: Once | INTRAVENOUS | Status: AC
  Administered 2022-06-06: 20 mg via INTRAVENOUS
  Filled 2022-06-06: qty 50

## 2022-06-06 MED ORDER — DIPHENHYDRAMINE HCL 50 MG/ML IJ SOLN
50.0000 mg | Freq: Once | INTRAMUSCULAR | Status: AC
  Administered 2022-06-06: 50 mg via INTRAVENOUS
  Filled 2022-06-06: qty 1

## 2022-06-06 NOTE — Progress Notes (Signed)
I saw the patient in the infusion room who is receiving her first cycle of carboplatin, Keytruda, and paclitaxel.  The patient had received her premedications with Aloxi, Decadron 10 mg, Pepcid 20, and Benadryl '50mg'$ .  Patient was receiving Emend when she started developing flushing and shortness of breath.  The Emend was stopped and she was given IV fluids and Solu-Medrol 125 mg per protocol by infusion RN.  By the time I saw the patient in the infusion room, her symptoms had started improving.  Her oxygen was 100% with 2 L of supplemental oxygen via nasal cannula. Lungs clear to ascultation. Vital signs within normal limits.  I added Emend to her allergy list and have asked pharmacy to remove this from her care plan moving forward.  Notify Wilber Bihari and Dr. Chryl Heck.  Patient will proceed with chemotherapy as scheduled today as her symptoms improved.

## 2022-06-06 NOTE — Progress Notes (Signed)
[place] Spiritual Care Note  Followed up with Sydney Lee briefly in infusion, but she was very drowsy from benadryl and a poor night's sleep, so we plan to connect early in a future treatment instead.   [place] [place] Pager [phone] Voicemail [phone]

## 2022-06-06 NOTE — Research (Signed)
[employer] 2021-05 - Specimen Collection Study to Evaluate Biomarkers in Subjects with Cancer   This Nurse has reviewed this patient's inclusion and exclusion criteria and confirmed Sydney Lee is eligible for study participation.  Patient will continue with enrollment.  Eligibility confirmed by treating investigator, who also agrees that patient should proceed with enrollment.  Marjie Skiff Taylon Coole, RN, BSN, [facility]  [facility]  [facility] Clinical Research Nurse [facility] Direct Dial [phone]  Pager [phone] 06/06/2022 9:25 AM

## 2022-06-06 NOTE — Progress Notes (Signed)
HPI:   Ms. Backer was previously seen in the [place] clinic due to a personal and family history of cancer and concerns regarding a hereditary predisposition to cancer. Please refer to our prior cancer genetics clinic note for more information regarding our discussion, assessment and recommendations, at the time. Ms. Iribe recent genetic test results were disclosed to her, as were recommendations warranted by these results. These results and recommendations are discussed in more detail below.  CANCER HISTORY:  Oncology History  Malignant neoplasm of upper-inner quadrant of left breast in female, estrogen receptor negative ([place])  04/27/2022 Mammogram   Diagnostic mammogram showed indeterminate solid mass in the 10:00 location of the left breast.  Small satellite nodule adjacent to the index mass.  Borderline lymph node has cortical thickening of 2.9 mm.  Other lymph nodes have normal morphology.   05/16/2022 Pathology Results   Left breast needle core biopsy showed invasive poorly differentiated adenocarcinoma, grade 3 with tumor necrosis, lymph node biopsy benign reactive, negative for carcinoma.  Prognostics from the tumor showed ER 0%, negative, PR 0%, negative, Ki-67 of 80% and HER2 negative   05/23/2022 Initial Diagnosis   Malignant neoplasm of upper-inner quadrant of left breast in female, estrogen receptor negative ([place])   05/25/2022 Cancer Staging   Staging form: Breast, AJCC 8th Edition - Clinical: Stage IIB (cT2, cN0, cM0, G3, ER-, PR-, HER2-) - Signed by Benay Pike, MD on 05/25/2022 Stage prefix: Initial diagnosis Histologic grading system: 3 grade system   05/25/2022 Genetic Testing   [facility] CancerNext-Expanded Panel was Negative. Report date was 06/02/2022.  The CancerNext-Expanded gene panel offered by [facility] and includes sequencing, rearrangement, and RNA analysis for the following 77 genes: AIP, ALK, APC, ATM, AXIN2, BAP1, BARD1, BLM, BMPR1A, BRCA1,  BRCA2, BRIP1, CDC73, CDH1, CDK4, CDKN1B, CDKN2A, CHEK2, CTNNA1, DICER1, FANCC, FH, FLCN, GALNT12, KIF1B, LZTR1, MAX, MEN1, MET, MLH1, MSH2, MSH3, MSH6, MUTYH, NBN, NF1, NF2, NTHL1, PALB2, PHOX2B, PMS2, POT1, PRKAR1A, PTCH1, PTEN, RAD51C, RAD51D, RB1, RECQL, RET, SDHA, SDHAF2, SDHB, SDHC, SDHD, SMAD4, SMARCA4, SMARCB1, SMARCE1, STK11, SUFU, TMEM127, TP53, TSC1, TSC2, VHL and XRCC2 (sequencing and deletion/duplication); EGFR, EGLN1, HOXB13, KIT, MITF, PDGFRA, POLD1, and POLE (sequencing only); EPCAM and GREM1 (deletion/duplication only).    06/02/2022 Initial Biopsy   Additional left axillary node biopsy   06/06/2022 -  Chemotherapy   Patient is on Treatment Plan : BREAST Pembrolizumab (200) D1 + Carboplatin (5) D1 + Paclitaxel (80) D1,8,15 q21d X 4 cycles / Pembrolizumab (200) D1 + AC D1 q21d x 4 cycles       FAMILY HISTORY:  We obtained a detailed, 4-generation family history.  Significant diagnoses are listed below:        Family History  Problem Relation Age of Onset   Leukemia Mother 85   Throat cancer Maternal Uncle 42   Prostate cancer Maternal Uncle     Breast cancer Cousin 1        maternal first cousin   Ovarian cancer Cousin          maternal first cousin   Breast cancer Cousin 38 - 74        paternal first cousin   Breast cancer Cousin 9 - 28        paternal first cousin           Ms. Sansoucie's mother was diagnosed with leukemia at age 58. She has two maternal uncles. One was diagnosed with throat cancer at age 54. Her second maternal uncle  was diagnosed with prostate cancer at an unknown age and this uncle's daughter was diagnosed with ovarian cancer. She has another first cousin (daughter of an aunt) who was diagnosed with breast cancer at age 7. She has two paternal cousins, one was diagnosed with breast cancer in her 21s and the other cousin was diagnosed with breast cancer in her 60s. Ms. Hollern reports a maternal first cousin once removed had negative genetic testing.  There is no reported Ashkenazi Jewish ancestry.    GENETIC TEST RESULTS:  The [facility] CancerNext-Expanded Panel found no pathogenic mutations.   The CancerNext-Expanded gene panel offered by [facility] and includes sequencing, rearrangement, and RNA analysis for the following 77 genes: AIP, ALK, APC, ATM, AXIN2, BAP1, BARD1, BLM, BMPR1A, BRCA1, BRCA2, BRIP1, CDC73, CDH1, CDK4, CDKN1B, CDKN2A, CHEK2, CTNNA1, DICER1, FANCC, FH, FLCN, GALNT12, KIF1B, LZTR1, MAX, MEN1, MET, MLH1, MSH2, MSH3, MSH6, MUTYH, NBN, NF1, NF2, NTHL1, PALB2, PHOX2B, PMS2, POT1, PRKAR1A, PTCH1, PTEN, RAD51C, RAD51D, RB1, RECQL, RET, SDHA, SDHAF2, SDHB, SDHC, SDHD, SMAD4, SMARCA4, SMARCB1, SMARCE1, STK11, SUFU, TMEM127, TP53, TSC1, TSC2, VHL and XRCC2 (sequencing and deletion/duplication); EGFR, EGLN1, HOXB13, KIT, MITF, PDGFRA, POLD1, and POLE (sequencing only); EPCAM and GREM1 (deletion/duplication only).   The test report has been scanned into EPIC and is located under the Molecular Pathology section of the Results Review tab.  A portion of the result report is included below for reference. Genetic testing reported out on 06/02/2022.       Even though a pathogenic variant was not identified, possible explanations for the cancer in the family may include: There may be no hereditary risk for cancer in the family. The cancers in Ms. Wilkowski and/or her family may be due to other genetic or environmental factors. There may be a gene mutation in one of these genes that current testing methods cannot detect, but that chance is small. There could be another gene that has not yet been discovered, or that we have not yet tested, that is responsible for the cancer diagnoses in the family.  It is also possible there is a hereditary cause for the cancer in the family that Ms. Toepfer did not inherit.  Therefore, it is important to remain in touch with cancer genetics in the future so that we can continue to offer Ms. Curbow the most up to  date genetic testing.   ADDITIONAL GENETIC TESTING:  We discussed with Ms. Naeve that her genetic testing was fairly extensive.  If there are genes identified to increase cancer risk that can be analyzed in the future, we would be happy to discuss and coordinate this testing at that time.    CANCER SCREENING RECOMMENDATIONS:  Ms. Saez test result is considered negative (normal).  This means that we have not identified a hereditary cause for her personal and family history of cancer at this time.  An individual's cancer risk and medical management are not determined by genetic test results alone. Overall cancer risk assessment incorporates additional factors, including personal medical history, family history, and any available genetic information that may result in a personalized plan for cancer prevention and surveillance. Therefore, it is recommended she continue to follow the cancer management and screening guidelines provided by her oncology and primary healthcare provider.  RECOMMENDATIONS FOR FAMILY MEMBERS:   Since she did not inherit a mutation in a cancer predisposition gene included on this panel, her daughter could not have inherited a mutation from her in one of these genes. Individuals in this family might  be at some increased risk of developing cancer, over the general population risk, due to the family history of cancer. We recommend women in this family have a yearly mammogram beginning at age 43, or 36 years younger than the earliest onset of cancer, an annual clinical breast exam, and perform monthly breast self-exams.  Other members of the family may still carry a pathogenic variant in one of these genes that Ms. Briggs did not inherit. Based on the family history, we recommend individuals with a personal history of cancer consider genetic counseling and testing.   FOLLOW-UP:  Cancer genetics is a rapidly advancing field and it is possible that new genetic tests will be  appropriate for her and/or her family members in the future. We encouraged her to remain in contact with cancer genetics on an annual basis so we can update her personal and family histories and let her know of advances in cancer genetics that may benefit this family.   Our contact number was provided. Ms. Nicklin questions were answered to her satisfaction, and she knows she is welcome to call us at anytime with additional questions or concerns.   Lucille Passy, MS, [facility] Genetic Counselor [email or web address] (P) [phone]

## 2022-06-06 NOTE — Progress Notes (Signed)
Patient had a hypersensitivity reaction to Emend- Ok to proceed with treatment per Wilber Bihari, NP. Will be written up in allergies.  Hypersensitivity Reaction note  Date of event: 06/06/22 Time of event: 12:25 Generic name of drug involved: Corley Name of provider notified of the hypersensitivity reaction: Dr. Julien Nordmann was informe in error initially (and Cassie Heeilingoetter, PA responded), but Dorthy Cooler, NP and Dr. Chryl Heck were informed, as well. Was agent that likely caused hypersensitivity reaction added to Allergies List within EMR? yes Chain of events including reaction signs/symptoms, treatment administered, and outcome (e.g., drug resumed; drug discontinued; sent to Emergency Department; etc.) Patient was receiving her premedications for a first time chemotherapy and her Emend was started- suddenly the patient became flushed, red and SOB. Emend was stopped immediately. 1226-Oxygen was placed at 2L/ [place] and IVF were started wide open to flush the medication. VS- BP 121/75, HR 86, RR 18, 97.9 F, 97% on 2L/[place]. Then at 1228 she received '125mg'$  solumedrol IV per protocol. Flushing and redness had already started to resolve. Cassie arrived and assessed her. MD was called- both eventually. Pharmacy was informed. VS at 1230- 110/71, HR 75, 98.5 F, RR18 and 100%. Ok to proceed from NP followed. See above  Sydney Singh, RN 06/06/2022 1:04 PM

## 2022-06-06 NOTE — Assessment & Plan Note (Addendum)
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  [name] and I reviewed her upcoming chemotherapy regimen and we discussed side effects, especially peripheral neuropathy in detail.  She understands risks and benefits of the treatment and is willing to proceed.  She understands how to take her antinausea medicine.  For the itching over her left upper inner breast I recommended she put Benadryl or hydrocortisone cream over top.  The tumor should start to shrink with the chemotherapy.  [name] and I discussed her labs and that we will be checking her thyroid with every visit and to let us know if she has any concerns or side effects after this treatment.  I reviewed I would rather her call and review things with [place] rather than not call and ended up in the emergency room.  She verbalized understanding of this.  [name] also has a history of diabetes therefore recommended that she check her blood sugar and let us know if it increases to above 250 consistently as we may need to adjust her dexamethasone dosing.  I let [name] know that the pathology from her lymph node biopsy has not yet returned  [name] will return weekly for her Taxol treatments and she will get the carboplatin and Keytruda every 3 weeks.  She has an appointment for for follow-up with [place] in 2 weeks.

## 2022-06-06 NOTE — Progress Notes (Signed)
[facility] Follow up:    Sydney Lee, [address] [address]   DIAGNOSIS:  Cancer Staging  Malignant neoplasm of upper-inner quadrant of left breast in female, estrogen receptor negative ([place]) Staging form: Breast, AJCC 8th Edition - Clinical: Stage IIB (cT2, cN0, cM0, G3, ER-, PR-, HER2-) - Signed by Benay Pike, MD on 05/25/2022 Stage prefix: Initial diagnosis Histologic grading system: 3 grade system   SUMMARY OF ONCOLOGIC HISTORY: Oncology History  Malignant neoplasm of upper-inner quadrant of left breast in female, estrogen receptor negative ([place])  04/27/2022 Mammogram   Diagnostic mammogram showed indeterminate solid mass in the 10:00 location of the left breast.  Small satellite nodule adjacent to the index mass.  Borderline lymph node has cortical thickening of 2.9 mm.  Other lymph nodes have normal morphology.   05/16/2022 Pathology Results   Left breast needle core biopsy showed invasive poorly differentiated adenocarcinoma, grade 3 with tumor necrosis, lymph node biopsy benign reactive, negative for carcinoma.  Prognostics from the tumor showed ER 0%, negative, PR 0%, negative, Ki-67 of 80% and HER2 negative   05/23/2022 Initial Diagnosis   Malignant neoplasm of upper-inner quadrant of left breast in female, estrogen receptor negative ([place])   05/25/2022 Cancer Staging   Staging form: Breast, AJCC 8th Edition - Clinical: Stage IIB (cT2, cN0, cM0, G3, ER-, PR-, HER2-) - Signed by Benay Pike, MD on 05/25/2022 Stage prefix: Initial diagnosis Histologic grading system: 3 grade system   05/25/2022 Genetic Testing   [facility] CancerNext-Expanded Panel was Negative. Report date was 06/02/2022.  The CancerNext-Expanded gene panel offered by [facility] and includes sequencing, rearrangement, and RNA analysis for the following 77 genes: AIP, ALK, APC, ATM, AXIN2, BAP1, BARD1, BLM, BMPR1A, BRCA1, BRCA2, BRIP1, CDC73, CDH1, CDK4, CDKN1B,  CDKN2A, CHEK2, CTNNA1, DICER1, FANCC, FH, FLCN, GALNT12, KIF1B, LZTR1, MAX, MEN1, MET, MLH1, MSH2, MSH3, MSH6, MUTYH, NBN, NF1, NF2, NTHL1, PALB2, PHOX2B, PMS2, POT1, PRKAR1A, PTCH1, PTEN, RAD51C, RAD51D, RB1, RECQL, RET, SDHA, SDHAF2, SDHB, SDHC, SDHD, SMAD4, SMARCA4, SMARCB1, SMARCE1, STK11, SUFU, TMEM127, TP53, TSC1, TSC2, VHL and XRCC2 (sequencing and deletion/duplication); EGFR, EGLN1, HOXB13, KIT, MITF, PDGFRA, POLD1, and POLE (sequencing only); EPCAM and GREM1 (deletion/duplication only).    06/02/2022 Initial Biopsy   Additional left axillary node biopsy   06/06/2022 -  Chemotherapy   Patient is on Treatment Plan : BREAST Pembrolizumab (200) D1 + Carboplatin (5) D1 + Paclitaxel (80) D1,8,15 q21d X 4 cycles / Pembrolizumab (200) D1 + AC D1 q21d x 4 cycles       CURRENT THERAPY: Taxol/Carbo/Keytruda  INTERVAL HISTORY: Sydney Lee 61 y.o. female returns for follow-up and evaluation prior to her first cycle of neoadjuvant chemotherapy.  She underwent port placement last week on 6/28 and axillary node biopsy on 6/29 with pathology currently pending.  Echo was completed on 6/29 and showed normal EF of 55-60% and normal global longitudinal strain.  She has h/o diabetes taking Ozempic once weekly.  Sydney Lee tells me that she quit smoking yesterday and is a 40 pack year tobacco history.  She notes that the area over her left breast tumor is itching somewhat and wants to know if there is anything that she can put over top of it.   Patient Active Problem List   Diagnosis Date Noted   Port-A-Cath in place 06/06/2022   Genetic testing 06/03/2022   Family history of breast cancer 05/25/2022   Family history of ovarian cancer 05/25/2022   Malignant  neoplasm of upper-inner quadrant of left breast in female, estrogen receptor negative ([place]) 05/23/2022    is allergic to codeine and latex.  MEDICAL HISTORY: Past Medical History:  Diagnosis Date   Anxiety    Arthritis    Breast cancer ([place])     Depression    Diabetes mellitus without complication ([place])    Hypertension     SURGICAL HISTORY: Past Surgical History:  Procedure Laterality Date   PORTACATH PLACEMENT N/A 06/01/2022   Procedure: INSERTION PORT-A-CATH WITH ULTRASOUND GUIDANCE;  Surgeon: Stark Klein, MD;  Location: [facility];  Service: General;  Laterality: N/A;    SOCIAL HISTORY: Social History   Socioeconomic History   Marital status: Married    Spouse name: Not on file   Number of children: Not on file   Years of education: Not on file   Highest education level: Not on file  Occupational History   Not on file  Tobacco Use   Smoking status: Former    Packs/day: 1.00    Years: 40.00    Total pack years: 40.00    Types: Cigarettes    Quit date: 06/05/2022   Smokeless tobacco: Never  Substance and Sexual Activity   Alcohol use: No   Drug use: No   Sexual activity: Not on file  Other Topics Concern   Not on file  Social History Narrative   Not on file   Social Determinants of Health   Financial Resource Strain: Not on file  Food Insecurity: Not on file  Transportation Needs: Not on file  Physical Activity: Not on file  Stress: Not on file  Social Connections: Not on file  Intimate Partner Violence: Not on file    FAMILY HISTORY: Family History  Problem Relation Age of Onset   Leukemia Mother 38   Throat cancer Maternal Uncle 52   Prostate cancer Maternal Uncle    Breast cancer Cousin 59       maternal first cousin   Ovarian cancer Cousin        maternal first cousin   Breast cancer Cousin 36 - 30       paternal first cousin   Breast cancer Cousin 21 - 50       paternal first cousin    Review of Systems  Constitutional:  Negative for appetite change, chills, fatigue, fever and unexpected weight change.  HENT:   Negative for hearing loss, lump/mass and trouble swallowing.   Eyes:  Negative for eye problems and icterus.  Respiratory:  Negative for chest tightness, cough and shortness of  breath.   Cardiovascular:  Negative for chest pain, leg swelling and palpitations.  Gastrointestinal:  Negative for abdominal distention, abdominal pain, constipation, diarrhea, nausea and vomiting.  Endocrine: Negative for hot flashes.  Genitourinary:  Negative for difficulty urinating.   Musculoskeletal:  Negative for arthralgias.  Skin:  Negative for itching and rash.  Neurological:  Negative for dizziness, extremity weakness, headaches and numbness.  Hematological:  Negative for adenopathy. Does not bruise/bleed easily.  Psychiatric/Behavioral:  Negative for depression. The patient is not nervous/anxious.       PHYSICAL EXAMINATION  ECOG PERFORMANCE STATUS: 1 - Symptomatic but completely ambulatory  Vitals:   06/06/22 1015  BP: 111/75  Pulse: 82  Resp: 18  Temp: 97.8 F (36.6 C)  SpO2: 97%    Physical Exam Constitutional:      General: She is not in acute distress.    Appearance: Normal appearance. She is not toxic-appearing.  HENT:  Head: Normocephalic and atraumatic.  Eyes:     General: No scleral icterus. Cardiovascular:     Rate and Rhythm: Normal rate and regular rhythm.     Pulses: Normal pulses.     Heart sounds: Normal heart sounds.  Pulmonary:     Effort: Pulmonary effort is normal.     Breath sounds: Normal breath sounds.  Chest:     Comments: Left upper inner breast with tumor noted along with skin erythema and skin involvement of the cancer.  However there is not exposed tumor noted. Abdominal:     General: Abdomen is flat. Bowel sounds are normal. There is no distension.     Palpations: Abdomen is soft.     Tenderness: There is no abdominal tenderness.  Musculoskeletal:        General: No swelling.     Cervical back: Neck supple.  Lymphadenopathy:     Cervical: No cervical adenopathy.  Skin:    General: Skin is warm and dry.     Findings: No rash.  Neurological:     General: No focal deficit present.     Mental Status: She is alert.   Psychiatric:        Mood and Affect: Mood normal.        Behavior: Behavior normal.     LABORATORY DATA:  CBC    Component Value Date/Time   WBC 6.5 06/06/2022 0946   WBC 4.8 12/22/2018 2150   RBC 4.44 06/06/2022 0946   HGB 13.3 06/06/2022 0946   HCT 39.2 06/06/2022 0946   PLT 268 06/06/2022 0946   MCV 88.3 06/06/2022 0946   MCH 30.0 06/06/2022 0946   MCHC 33.9 06/06/2022 0946   RDW 13.4 06/06/2022 0946   LYMPHSABS 2.7 06/06/2022 0946   MONOABS 0.5 06/06/2022 0946   EOSABS 0.2 06/06/2022 0946   BASOSABS 0.0 06/06/2022 0946    CMP     Component Value Date/Time   NA 138 06/06/2022 0946   K 3.9 06/06/2022 0946   CL 103 06/06/2022 0946   CO2 30 06/06/2022 0946   GLUCOSE 156 (H) 06/06/2022 0946   BUN 12 06/06/2022 0946   CREATININE 0.68 06/06/2022 0946   CALCIUM 9.1 06/06/2022 0946   PROT 7.4 06/06/2022 0946   ALBUMIN 3.9 06/06/2022 0946   AST 12 (L) 06/06/2022 0946   ALT 15 06/06/2022 0946   ALKPHOS 88 06/06/2022 0946   BILITOT 0.3 06/06/2022 0946   GFRNONAA >60 06/06/2022 0946   GFRAA >60 12/22/2018 2150       ASSESSMENT and THERAPY PLAN:   Malignant neoplasm of upper-inner quadrant of left breast in female, estrogen receptor negative ([place]) Sydney Lee is a 61 year old woman with stage IIb triple negative breast cancer currently on treatment with neoadjuvant chemotherapy beginning Taxol, carboplatin, Keytruda to be followed by Adriamycin, Cytoxan, Keytruda.  Sydney Lee and I reviewed her upcoming chemotherapy regimen and we discussed side effects, especially peripheral neuropathy in detail.  She understands risks and benefits of the treatment and is willing to proceed.  She understands how to take her antinausea medicine.  For the itching over her left upper inner breast I recommended she put Benadryl or hydrocortisone cream over top.  The tumor should start to shrink with the chemotherapy.  [name] and I discussed her labs and that we will be checking her thyroid with  every visit and to let us know if she has any concerns or side effects after this treatment.  I reviewed I would rather her call  and review things with [place] rather than not call and ended up in the emergency room.  She verbalized understanding of this.  [name] also has a history of diabetes therefore recommended that she check her blood sugar and let us know if it increases to above 250 consistently as we may need to adjust her dexamethasone dosing.  [place] will return weekly for her Taxol treatments and she will get the carboplatin and Keytruda every 3 weeks.  She has an appointment for for follow-up with [place] in 2 weeks.   All questions were answered. The patient knows to call the clinic with any problems, questions or concerns. We can certainly see the patient much sooner if necessary.  Total encounter time:30 minutes*in face-to-face visit time, chart review, lab review, care coordination, order entry, and documentation of the encounter time.    Wilber Bihari, NP 06/06/22 11:11 AM Medical Oncology and Hematology [facility] [place], [place] [address] Tel. [phone]    Fax. [phone]  *Total Encounter Time as defined by the Centers for Medicare and Medicaid Services includes, in addition to the face-to-face time of a patient visit (documented in the note above) non-face-to-face time: obtaining and reviewing outside history, ordering and reviewing medications, tests or procedures, care coordination (communications with other health care professionals or caregivers) and documentation in the medical record.

## 2022-06-06 NOTE — Patient Instructions (Signed)
[facility]  Discharge Instructions: Thank you for choosing [facility] to provide your oncology and hematology care.   If you have a lab appointment with the [facility], please go directly to the [facility] and check in at the registration area.   Wear comfortable clothing and clothing appropriate for easy access to any Portacath or PICC line.   We strive to give you quality time with your provider. You may need to reschedule your appointment if you arrive late (15 or more minutes).  Arriving late affects you and other patients whose appointments are after yours.  Also, if you miss three or more appointments without notifying the office, you may be dismissed from the clinic at the provider's discretion.      For prescription refill requests, have your pharmacy contact our office and allow 72 hours for refills to be completed.    Today you received the following chemotherapy and/or immunotherapy agents: Paclitaxel, Carboplatin, Pembrolizumab      To help prevent nausea and vomiting after your treatment, we encourage you to take your nausea medication as directed.  BELOW ARE SYMPTOMS THAT SHOULD BE REPORTED IMMEDIATELY: *FEVER GREATER THAN 100.4 F (38 C) OR HIGHER *CHILLS OR SWEATING *NAUSEA AND VOMITING THAT IS NOT CONTROLLED WITH YOUR NAUSEA MEDICATION *UNUSUAL SHORTNESS OF BREATH *UNUSUAL BRUISING OR BLEEDING *URINARY PROBLEMS (pain or burning when urinating, or frequent urination) *BOWEL PROBLEMS (unusual diarrhea, constipation, pain near the anus) TENDERNESS IN MOUTH AND THROAT WITH OR WITHOUT PRESENCE OF ULCERS (sore throat, sores in mouth, or a toothache) UNUSUAL RASH, SWELLING OR PAIN  UNUSUAL VAGINAL DISCHARGE OR ITCHING   Items with * indicate a potential emergency and should be followed up as soon as possible or go to the Emergency Department if any problems should occur.  Please show the CHEMOTHERAPY ALERT CARD or  IMMUNOTHERAPY ALERT CARD at check-in to the Emergency Department and triage nurse.  Should you have questions after your visit or need to cancel or reschedule your appointment, please contact [facility]  Dept: [phone]  and follow the prompts.  Office hours are 8:00 a.m. to 4:30 p.m. Monday - Friday. Please note that voicemails left after 4:00 p.m. may not be returned until the following business day.  We are closed weekends and major holidays. You have access to a nurse at all times for urgent questions. Please call the main number to the clinic Dept: [phone] and follow the prompts.   For any non-urgent questions, you may also contact your provider using MyChart. We now offer e-Visits for anyone 5 and older to request care online for non-urgent symptoms. For details visit [email or web address].   Also download the MyChart app! Go to the app store, search "MyChart", open the app, select [facility], and log in with your MyChart username and password.  Masks are optional in the cancer centers. If you would like for your care team to wear a mask while they are taking care of you, please let them know. For doctor visits, patients may have with them one support person who is at least 62 years old. At this time, visitors are not allowed in the infusion area. Paclitaxel injection What is this medication? PACLITAXEL (PAK li TAX el) is a chemotherapy drug. It targets fast dividing cells, like cancer cells, and causes these cells to die. This medicine is used to treat ovarian cancer, breast cancer, lung cancer, Kaposi's sarcoma, and other cancers. This medicine may  be used for other purposes; ask your health care provider or pharmacist if you have questions. COMMON BRAND NAME(S): Onxol, Taxol What should I tell my care team before I take this medication? They need to know if you have any of these conditions: history of irregular heartbeat liver disease low blood  counts, like low white cell, platelet, or red cell counts lung or breathing disease, like asthma tingling of the fingers or toes, or other nerve disorder an unusual or allergic reaction to paclitaxel, alcohol, polyoxyethylated castor oil, other chemotherapy, other medicines, foods, dyes, or preservatives pregnant or trying to get pregnant breast-feeding How should I use this medication? This drug is given as an infusion into a vein. It is administered in a hospital or clinic by a specially trained health care professional. Talk to your pediatrician regarding the use of this medicine in children. Special care may be needed. Overdosage: If you think you have taken too much of this medicine contact a poison control center or emergency room at once. NOTE: This medicine is only for you. Do not share this medicine with others. What if I miss a dose? It is important not to miss your dose. Call your doctor or health care professional if you are unable to keep an appointment. What may interact with this medication? Do not take this medicine with any of the following medications: live virus vaccines This medicine may also interact with the following medications: antiviral medicines for hepatitis, HIV or AIDS certain antibiotics like erythromycin and clarithromycin certain medicines for fungal infections like ketoconazole and itraconazole certain medicines for seizures like carbamazepine, phenobarbital, phenytoin gemfibrozil nefazodone rifampin St. John's wort This list may not describe all possible interactions. Give your health care provider a list of all the medicines, herbs, non-prescription drugs, or dietary supplements you use. Also tell them if you smoke, drink alcohol, or use illegal drugs. Some items may interact with your medicine. What should I watch for while using this medication? Your condition will be monitored carefully while you are receiving this medicine. You will need important  blood work done while you are taking this medicine. This medicine can cause serious allergic reactions. To reduce your risk you will need to take other medicine(s) before treatment with this medicine. If you experience allergic reactions like skin rash, itching or hives, swelling of the face, lips, or tongue, tell your doctor or health care professional right away. In some cases, you may be given additional medicines to help with side effects. Follow all directions for their use. This drug may make you feel generally unwell. This is not uncommon, as chemotherapy can affect healthy cells as well as cancer cells. Report any side effects. Continue your course of treatment even though you feel ill unless your doctor tells you to stop. Call your doctor or health care professional for advice if you get a fever, chills or sore throat, or other symptoms of a cold or flu. Do not treat yourself. This drug decreases your body's ability to fight infections. Try to avoid being around people who are sick. This medicine may increase your risk to bruise or bleed. Call your doctor or health care professional if you notice any unusual bleeding. Be careful brushing and flossing your teeth or using a toothpick because you may get an infection or bleed more easily. If you have any dental work done, tell your dentist you are receiving this medicine. Avoid taking products that contain aspirin, acetaminophen, ibuprofen, naproxen, or ketoprofen unless instructed by your  doctor. These medicines may hide a fever. Do not become pregnant while taking this medicine. Women should inform their doctor if they wish to become pregnant or think they might be pregnant. There is a potential for serious side effects to an unborn child. Talk to your health care professional or pharmacist for more information. Do not breast-feed an infant while taking this medicine. Men are advised not to father a child while receiving this medicine. This product  may contain alcohol. Ask your pharmacist or healthcare provider if this medicine contains alcohol. Be sure to tell all healthcare providers you are taking this medicine. Certain medicines, like metronidazole and disulfiram, can cause an unpleasant reaction when taken with alcohol. The reaction includes flushing, headache, nausea, vomiting, sweating, and increased thirst. The reaction can last from 30 minutes to several hours. What side effects may I notice from receiving this medication? Side effects that you should report to your doctor or health care professional as soon as possible: allergic reactions like skin rash, itching or hives, swelling of the face, lips, or tongue breathing problems changes in vision fast, irregular heartbeat high or low blood pressure mouth sores pain, tingling, numbness in the hands or feet signs of decreased platelets or bleeding - bruising, pinpoint red spots on the skin, black, tarry stools, blood in the urine signs of decreased red blood cells - unusually weak or tired, feeling faint or lightheaded, falls signs of infection - fever or chills, cough, sore throat, pain or difficulty passing urine signs and symptoms of liver injury like dark yellow or brown urine; general ill feeling or flu-like symptoms; light-colored stools; loss of appetite; nausea; right upper belly pain; unusually weak or tired; yellowing of the eyes or skin swelling of the ankles, feet, hands unusually slow heartbeat Side effects that usually do not require medical attention (report to your doctor or health care professional if they continue or are bothersome): diarrhea hair loss loss of appetite muscle or joint pain nausea, vomiting pain, redness, or irritation at site where injected tiredness This list may not describe all possible side effects. Call your doctor for medical advice about side effects. You may report side effects to FDA at 1-800-FDA-1088. Where should I keep my  medication? This drug is given in a hospital or clinic and will not be stored at home. NOTE: This sheet is a summary. It may not cover all possible information. If you have questions about this medicine, talk to your doctor, pharmacist, or health care provider.  2023 Elsevier/Gold Standard (2021-10-22 00:00:00) Carboplatin injection What is this medication? CARBOPLATIN (KAR boe pla tin) is a chemotherapy drug. It targets fast dividing cells, like cancer cells, and causes these cells to die. This medicine is used to treat ovarian cancer and many other cancers. This medicine may be used for other purposes; ask your health care provider or pharmacist if you have questions. COMMON BRAND NAME(S): Paraplatin What should I tell my care team before I take this medication? They need to know if you have any of these conditions: blood disorders hearing problems kidney disease recent or ongoing radiation therapy an unusual or allergic reaction to carboplatin, cisplatin, other chemotherapy, other medicines, foods, dyes, or preservatives pregnant or trying to get pregnant breast-feeding How should I use this medication? This drug is usually given as an infusion into a vein. It is administered in a hospital or clinic by a specially trained health care professional. Talk to your pediatrician regarding the use of this medicine in children. Special  care may be needed. Overdosage: If you think you have taken too much of this medicine contact a poison control center or emergency room at once. NOTE: This medicine is only for you. Do not share this medicine with others. What if I miss a dose? It is important not to miss a dose. Call your doctor or health care professional if you are unable to keep an appointment. What may interact with this medication? medicines for seizures medicines to increase blood counts like filgrastim, pegfilgrastim, sargramostim some antibiotics like amikacin, gentamicin, neomycin,  streptomycin, tobramycin vaccines Talk to your doctor or health care professional before taking any of these medicines: acetaminophen aspirin ibuprofen ketoprofen naproxen This list may not describe all possible interactions. Give your health care provider a list of all the medicines, herbs, non-prescription drugs, or dietary supplements you use. Also tell them if you smoke, drink alcohol, or use illegal drugs. Some items may interact with your medicine. What should I watch for while using this medication? Your condition will be monitored carefully while you are receiving this medicine. You will need important blood work done while you are taking this medicine. This drug may make you feel generally unwell. This is not uncommon, as chemotherapy can affect healthy cells as well as cancer cells. Report any side effects. Continue your course of treatment even though you feel ill unless your doctor tells you to stop. In some cases, you may be given additional medicines to help with side effects. Follow all directions for their use. Call your doctor or health care professional for advice if you get a fever, chills or sore throat, or other symptoms of a cold or flu. Do not treat yourself. This drug decreases your body's ability to fight infections. Try to avoid being around people who are sick. This medicine may increase your risk to bruise or bleed. Call your doctor or health care professional if you notice any unusual bleeding. Be careful brushing and flossing your teeth or using a toothpick because you may get an infection or bleed more easily. If you have any dental work done, tell your dentist you are receiving this medicine. Avoid taking products that contain aspirin, acetaminophen, ibuprofen, naproxen, or ketoprofen unless instructed by your doctor. These medicines may hide a fever. Do not become pregnant while taking this medicine. Women should inform their doctor if they wish to become pregnant or  think they might be pregnant. There is a potential for serious side effects to an unborn child. Talk to your health care professional or pharmacist for more information. Do not breast-feed an infant while taking this medicine. What side effects may I notice from receiving this medication? Side effects that you should report to your doctor or health care professional as soon as possible: allergic reactions like skin rash, itching or hives, swelling of the face, lips, or tongue signs of infection - fever or chills, cough, sore throat, pain or difficulty passing urine signs of decreased platelets or bleeding - bruising, pinpoint red spots on the skin, black, tarry stools, nosebleeds signs of decreased red blood cells - unusually weak or tired, fainting spells, lightheadedness breathing problems changes in hearing changes in vision chest pain high blood pressure low blood counts - This drug may decrease the number of white blood cells, red blood cells and platelets. You may be at increased risk for infections and bleeding. nausea and vomiting pain, swelling, redness or irritation at the injection site pain, tingling, numbness in the hands or feet problems with  balance, talking, walking trouble passing urine or change in the amount of urine Side effects that usually do not require medical attention (report to your doctor or health care professional if they continue or are bothersome): hair loss loss of appetite metallic taste in the mouth or changes in taste This list may not describe all possible side effects. Call your doctor for medical advice about side effects. You may report side effects to FDA at 1-800-FDA-1088. Where should I keep my medication? This drug is given in a hospital or clinic and will not be stored at home. NOTE: This sheet is a summary. It may not cover all possible information. If you have questions about this medicine, talk to your doctor, pharmacist, or health care  provider.  2023 Elsevier/Gold Standard (2008-04-30 00:00:00) Pembrolizumab injection What is this medication? PEMBROLIZUMAB (pem broe liz ue mab) is a monoclonal antibody. It is used to treat certain types of cancer. This medicine may be used for other purposes; ask your health care provider or pharmacist if you have questions. COMMON BRAND NAME(S): Keytruda What should I tell my care team before I take this medication? They need to know if you have any of these conditions: autoimmune diseases like Crohn's disease, ulcerative colitis, or lupus have had or planning to have an allogeneic stem cell transplant (uses someone else's stem cells) history of organ transplant history of chest radiation nervous system problems like myasthenia gravis or Guillain-Barre syndrome an unusual or allergic reaction to pembrolizumab, other medicines, foods, dyes, or preservatives pregnant or trying to get pregnant breast-feeding How should I use this medication? This medicine is for infusion into a vein. It is given by a health care professional in a hospital or clinic setting. A special MedGuide will be given to you before each treatment. Be sure to read this information carefully each time. Talk to your pediatrician regarding the use of this medicine in children. While this drug may be prescribed for children as young as 6 months for selected conditions, precautions do apply. Overdosage: If you think you have taken too much of this medicine contact a poison control center or emergency room at once. NOTE: This medicine is only for you. Do not share this medicine with others. What if I miss a dose? It is important not to miss your dose. Call your doctor or health care professional if you are unable to keep an appointment. What may interact with this medication? Interactions have not been studied. This list may not describe all possible interactions. Give your health care provider a list of all the medicines,  herbs, non-prescription drugs, or dietary supplements you use. Also tell them if you smoke, drink alcohol, or use illegal drugs. Some items may interact with your medicine. What should I watch for while using this medication? Your condition will be monitored carefully while you are receiving this medicine. You may need blood work done while you are taking this medicine. Do not become pregnant while taking this medicine or for 4 months after stopping it. Women should inform their doctor if they wish to become pregnant or think they might be pregnant. There is a potential for serious side effects to an unborn child. Talk to your health care professional or pharmacist for more information. Do not breast-feed an infant while taking this medicine or for 4 months after the last dose. What side effects may I notice from receiving this medication? Side effects that you should report to your doctor or health care professional as soon  as possible: allergic reactions like skin rash, itching or hives, swelling of the face, lips, or tongue bloody or black, tarry breathing problems changes in vision chest pain chills confusion constipation cough diarrhea dizziness or feeling faint or lightheaded fast or irregular heartbeat fever flushing joint pain low blood counts - this medicine may decrease the number of white blood cells, red blood cells and platelets. You may be at increased risk for infections and bleeding. muscle pain muscle weakness pain, tingling, numbness in the hands or feet persistent headache redness, blistering, peeling or loosening of the skin, including inside the mouth signs and symptoms of high blood sugar such as dizziness; dry mouth; dry skin; fruity breath; nausea; stomach pain; increased hunger or thirst; increased urination signs and symptoms of kidney injury like trouble passing urine or change in the amount of urine signs and symptoms of liver injury like dark urine,  light-colored stools, loss of appetite, nausea, right upper belly pain, yellowing of the eyes or skin sweating swollen lymph nodes weight loss Side effects that usually do not require medical attention (report to your doctor or health care professional if they continue or are bothersome): decreased appetite hair loss tiredness This list may not describe all possible side effects. Call your doctor for medical advice about side effects. You may report side effects to FDA at 1-800-FDA-1088. Where should I keep my medication? This drug is given in a hospital or clinic and will not be stored at home. NOTE: This sheet is a summary. It may not cover all possible information. If you have questions about this medicine, talk to your doctor, pharmacist, or health care provider.  2023 Elsevier/Gold Standard (2021-10-22 00:00:00)

## 2022-06-06 NOTE — Research (Signed)
[employer] 2021-05 - Specimen Collection Study to Evaluate Biomarkers in Subjects with Cancer    06/06/2022  SECOND ELIGIBILITY:  This Coordinator has reviewed this patient's inclusion and exclusion criteria and confirmed Sydney Lee is eligible for study participation.  Patient will continue with enrollment.  Eligibility confirmed by research nurse and treating investigator, who also agrees that patient should proceed with enrollment.   CONSENT:  Patient Sydney Lee was identified by Dr. Chryl Heck as a potential candidate for the above listed study.  This Clinical Research Coordinator met with SHEVETTE BESS, [id number] on 06/06/22 in a manner and location that ensures patient privacy to discuss participation in the above listed research study.  Patient is Unaccompanied.  Patient was previously provided with informed consent documents.  Patient confirmed they have read the informed consent documents.  As outlined in the informed consent form, this Coordinator and SEBRENA ENGH discussed the purpose of the research study, the investigational nature of the study, study procedures and requirements for study participation, potential risks and benefits of study participation, as well as alternatives to participation.  This study is not blinded or double-blinded. The patient understands participation is voluntary and they may withdraw from study participation at any time.  This study does not involve randomization.  This study does not involve an investigational drug or device. This study does not involve a placebo. Patient understands enrollment is pending full eligibility review.   Confidentiality and how the patient's information will be used as part of study participation were discussed.  Patient was informed there is reimbursement provided for their time and effort spent on trial participation.  The patient is encouraged to discuss research study participation with their insurance provider to  determine what costs they may incur as part of study participation, including research related injury.    All questions were answered to patient's satisfaction.  The informed consent with embedded HIPAA language was reviewed page by page.  The patient's mental and emotional status is appropriate to provide informed consent, and the patient verbalizes an understanding of study participation.  Patient has agreed to participate in the above listed research study and has voluntarily signed the informed consent version dated 03 Jan 2022 and separate HIPAA Authorization, version dated 03 Jan 2022  on 06/06/22 at 9:02AM.  The patient was provided with a copy of the signed informed consent form with embedded HIPAA language for their reference.  No study specific procedures were obtained prior to the signing of the informed consent document.  Approximately 20 minutes were spent with the patient reviewing the informed consent documents.  Patient was not requested to complete a Release of Information form.   After consent completion, medical history was obtained as follows:   Medical History:  High Blood Pressure  Yes Coronary Artery Disease No Lupus    No Rheumatoid Arthritis  No Diabetes   Yes      If yes, which type?      Type II Lynch Syndrome  No  Is the patient currently taking a magnesium supplement?   No   Does the patient have a personal history of cancer (greater than 5 years ago)?  No   Does the patient have a family history of cancer in 1st or 2nd degree relatives? Yes If yes, Relationship(s) and Cancer type(s)? Cousins x 5: breast (paternal)  Does the patient have history of alcohol consumption? No    Does the patient have history of cigarette, cigar, pipe, or chewing tobacco  use?  Yes  If yes, current for former? Current If yes, type (Cigarette, cigar, pipe, and/or chewing tobacco)? cigarette   If former, year stopped?  Number of years? 40 Packs/number/containers per day? 1    LABS/GIFT CARD: After completion of consent and medical history data collection, patient was escorted to port flush to collect labs for the above mentioned study. . Blood Collection: Research blood obtained by  [facility] a Cath per patient's preference. Patient tolerated well without any adverse events. Gift Card: $50 gift card given to patient for her participation in this study.    Carol Ada, RT(R)(T) Clinical Research Coordinator

## 2022-06-07 LAB — T4: T4, Total: 8 ug/dL (ref 4.5–12.0)

## 2022-06-09 ENCOUNTER — Telehealth: Payer: Self-pay | Admitting: *Deleted

## 2022-06-09 NOTE — Telephone Encounter (Signed)
Connected with KONNOR JORDEN [phone] (home) regarding [place] form received 06/03/2022 is next in queue to process.  Also received patient's [place] form on 0/02/7542.  [facility] Authorization required for Use / Disclosure of PHI signed by patient or legal representative.  Two authorization forms addressed to specific named parties to office entry receptionist area for signature on next scheduled appointment.      "Have received some paperwork for self however my daughter needs hers.  Will take care of that on Monday." Currently no further questions or needs.

## 2022-06-09 NOTE — Telephone Encounter (Signed)
Sydney Lee daughter's FMLA paperwork completed by this nurse today.  Currently placed in designated mail bin for collaborative pick up.  Awaiting provider review and signature. [place] form not for provider signature.  Request patient describe onset and nature of your illness with treating providers and  providers seen in the last 2 years.  Completed by this nurse per EMR data only.

## 2022-06-10 MED FILL — Dexamethasone Sodium Phosphate Inj 100 MG/10ML: INTRAMUSCULAR | Qty: 1 | Status: AC

## 2022-06-13 ENCOUNTER — Inpatient Hospital Stay: Payer: BC Managed Care – PPO

## 2022-06-13 ENCOUNTER — Encounter: Payer: Self-pay | Admitting: *Deleted

## 2022-06-13 ENCOUNTER — Other Ambulatory Visit: Payer: Self-pay

## 2022-06-13 VITALS — BP 111/75 | HR 72 | Temp 98.7°F | Resp 18 | Wt 217.5 lb

## 2022-06-13 DIAGNOSIS — Z171 Estrogen receptor negative status [ER-]: Secondary | ICD-10-CM

## 2022-06-13 DIAGNOSIS — Z5112 Encounter for antineoplastic immunotherapy: Secondary | ICD-10-CM | POA: Diagnosis not present

## 2022-06-13 DIAGNOSIS — Z95828 Presence of other vascular implants and grafts: Secondary | ICD-10-CM

## 2022-06-13 LAB — CMP (CANCER CENTER ONLY)
ALT: 13 U/L (ref 0–44)
AST: 13 U/L — ABNORMAL LOW (ref 15–41)
Albumin: 4 g/dL (ref 3.5–5.0)
Alkaline Phosphatase: 77 U/L (ref 38–126)
Anion gap: 6 (ref 5–15)
BUN: 13 mg/dL (ref 8–23)
CO2: 28 mmol/L (ref 22–32)
Calcium: 9.2 mg/dL (ref 8.9–10.3)
Chloride: 105 mmol/L (ref 98–111)
Creatinine: 0.64 mg/dL (ref 0.44–1.00)
GFR, Estimated: >60 mL/min (ref >60)
Glucose, Bld: 152 mg/dL — ABNORMAL HIGH (ref 70–99)
Potassium: 4.1 mmol/L (ref 3.5–5.1)
Sodium: 139 mmol/L (ref 135–145)
Total Bilirubin: 0.6 mg/dL (ref 0.3–1.2)
Total Protein: 7.1 g/dL (ref 6.5–8.1)

## 2022-06-13 LAB — CBC WITH DIFFERENTIAL (CANCER CENTER ONLY)
Abs Immature Granulocytes: 0.01 10*3/uL (ref 0.00–0.07)
Basophils Absolute: 0 10*3/uL (ref 0.0–0.1)
Basophils Relative: 1 %
Eosinophils Absolute: 0.1 10*3/uL (ref 0.0–0.5)
Eosinophils Relative: 4 %
HCT: 36.5 % (ref 36.0–46.0)
Hemoglobin: 12.4 g/dL (ref 12.0–15.0)
Immature Granulocytes: 0 %
Lymphocytes Relative: 46 %
Lymphs Abs: 1.3 10*3/uL (ref 0.7–4.0)
MCH: 29.7 pg (ref 26.0–34.0)
MCHC: 34 g/dL (ref 30.0–36.0)
MCV: 87.3 fL (ref 80.0–100.0)
Monocytes Absolute: 0.2 10*3/uL (ref 0.1–1.0)
Monocytes Relative: 6 %
Neutro Abs: 1.2 10*3/uL — ABNORMAL LOW (ref 1.7–7.7)
Neutrophils Relative %: 43 %
Platelet Count: 221 10*3/uL (ref 150–400)
RBC: 4.18 MIL/uL (ref 3.87–5.11)
RDW: 13.1 % (ref 11.5–15.5)
WBC Count: 2.8 10*3/uL — ABNORMAL LOW (ref 4.0–10.5)
nRBC: 0 % (ref 0.0–0.2)

## 2022-06-13 LAB — TSH: TSH: 1.014 u[IU]/mL (ref 0.350–4.500)

## 2022-06-13 MED ORDER — SODIUM CHLORIDE 0.9% FLUSH
10.0000 mL | INTRAVENOUS | Status: DC | PRN
  Administered 2022-06-13: 10 mL

## 2022-06-13 MED ORDER — DIPHENHYDRAMINE HCL 50 MG/ML IJ SOLN
50.0000 mg | Freq: Once | INTRAMUSCULAR | Status: AC
  Administered 2022-06-13: 50 mg via INTRAVENOUS
  Filled 2022-06-13: qty 1

## 2022-06-13 MED ORDER — SODIUM CHLORIDE 0.9 % IV SOLN
80.0000 mg/m2 | Freq: Once | INTRAVENOUS | Status: AC
  Administered 2022-06-13: 174 mg via INTRAVENOUS
  Filled 2022-06-13: qty 29

## 2022-06-13 MED ORDER — HEPARIN SOD (PORK) LOCK FLUSH 100 UNIT/ML IV SOLN
500.0000 [IU] | Freq: Once | INTRAVENOUS | Status: AC | PRN
  Administered 2022-06-13: 500 [IU]

## 2022-06-13 MED ORDER — SODIUM CHLORIDE 0.9 % IV SOLN
10.0000 mg | Freq: Once | INTRAVENOUS | Status: AC
  Administered 2022-06-13: 10 mg via INTRAVENOUS
  Filled 2022-06-13: qty 10

## 2022-06-13 MED ORDER — SODIUM CHLORIDE 0.9 % IV SOLN: Freq: Once | INTRAVENOUS | Status: AC

## 2022-06-13 MED ORDER — FAMOTIDINE IN NACL 20-0.9 MG/50ML-% IV SOLN
20.0000 mg | Freq: Once | INTRAVENOUS | Status: AC
  Administered 2022-06-13: 20 mg via INTRAVENOUS
  Filled 2022-06-13: qty 50

## 2022-06-13 MED ORDER — SODIUM CHLORIDE 0.9% FLUSH
10.0000 mL | Freq: Once | INTRAVENOUS | Status: AC
  Administered 2022-06-13: 10 mL

## 2022-06-13 NOTE — Progress Notes (Signed)
Per Dr. Chryl Heck, okay to treat with ANC 1.2

## 2022-06-13 NOTE — Patient Instructions (Signed)
[facility] ONCOL[facility]  Discharge Instructions: Thank you for choosing [facility] to provide your oncology and hematology care.   If you have a lab appointment with the [facility], please go directly to the [facility] and check in at the registration area.   Wear comfortable clothing and clothing appropriate for easy access to any Portacath or PICC line.   We strive to give you quality time with your provider. You may need to reschedule your appointment if you arrive late (15 or more minutes).  Arriving late affects you and other patients whose appointments are after yours.  Also, if you miss three or more appointments without notifying the office, you may be dismissed from the clinic at the provider's discretion.      For prescription refill requests, have your pharmacy contact our office and allow 72 hours for refills to be completed.    Today you received the following chemotherapy and/or immunotherapy agents: Paclitaxel       To help prevent nausea and vomiting after your treatment, we encourage you to take your nausea medication as directed.  BELOW ARE SYMPTOMS THAT SHOULD BE REPORTED IMMEDIATELY: *FEVER GREATER THAN 100.4 F (38 C) OR HIGHER *CHILLS OR SWEATING *NAUSEA AND VOMITING THAT IS NOT CONTROLLED WITH YOUR NAUSEA MEDICATION *UNUSUAL SHORTNESS OF BREATH *UNUSUAL BRUISING OR BLEEDING *URINARY PROBLEMS (pain or burning when urinating, or frequent urination) *BOWEL PROBLEMS (unusual diarrhea, constipation, pain near the anus) TENDERNESS IN MOUTH AND THROAT WITH OR WITHOUT PRESENCE OF ULCERS (sore throat, sores in mouth, or a toothache) UNUSUAL RASH, SWELLING OR PAIN  UNUSUAL VAGINAL DISCHARGE OR ITCHING   Items with * indicate a potential emergency and should be followed up as soon as possible or go to the Emergency Department if any problems should occur.  Please show the CHEMOTHERAPY ALERT CARD or IMMUNOTHERAPY ALERT CARD at check-in  to the Emergency Department and triage nurse.  Should you have questions after your visit or need to cancel or reschedule your appointment, please contact [facility]  Dept: [phone]  and follow the prompts.  Office hours are 8:00 a.m. to 4:30 p.m. Monday - Friday. Please note that voicemails left after 4:00 p.m. may not be returned until the following business day.  We are closed weekends and major holidays. You have access to a nurse at all times for urgent questions. Please call the main number to the clinic Dept: [phone] and follow the prompts.   For any non-urgent questions, you may also contact your provider using MyChart. We now offer e-Visits for anyone 49 and older to request care online for non-urgent symptoms. For details visit [email or web address].   Also download the MyChart app! Go to the app store, search "MyChart", open the app, select [facility], and log in with your MyChart username and password.  Masks are optional in the cancer centers. If you would like for your care team to wear a mask while they are taking care of you, please let them know. For doctor visits, patients may have with them one support Nella Botsford who is at least 62 years old. At this time, visitors are not allowed in the infusion area.

## 2022-06-14 ENCOUNTER — Encounter: Payer: Self-pay | Admitting: Hematology and Oncology

## 2022-06-14 ENCOUNTER — Inpatient Hospital Stay: Payer: BC Managed Care – PPO | Admitting: Licensed Clinical Social Worker

## 2022-06-14 LAB — T4: T4, Total: 7.2 ug/dL (ref 4.5–12.0)

## 2022-06-14 NOTE — Progress Notes (Signed)
Received referral to contact patient regarding [employer] grant. Had registration to provide patient with my card on 6/29 at chemo ed appointment.  Called patient to introduce myself as Arboriculturist and to offer available resources. Patient states she has not had a chance to review information and reach out due to her mom being on Hospice.   Discussed one-time $1000 Radio broadcast assistant to assist with personal expenses while going through treatment. Advised what is needed to apply. She verbalized understanding and will provide that information to registration staff on 7/18. She will also be given grant paperwork to complete and call me at her earliest convenience afterwards to discuss grant details.  Asked if she was familiar with [place] whom assists patients who live in [place]. She states she was not but was given application by Education officer, museum.  Asked about insurance ded and OOP and she states she does not think she has met. Advised there is available copay assistance for Keytruda through [employer] and I would prepare the application and leave for her to complete at next visit 7/18. She was appreciative and verbalized understanding.  Gave her my name and direct number should she need to reach out to me before then,

## 2022-06-14 NOTE — Progress Notes (Signed)
[facility] Work  Initial Assessment   SUMMAR MCGLOTHLIN is a 61 y.o. year old female seen in support services for walk-in visit . Clinical Social Work was referred by self for assessment of psychosocial needs.   SDOH (Social Determinants of Health) assessments performed: Yes SDOH Interventions    Flowsheet Row Most Recent Value  SDOH Interventions   Food Insecurity Interventions [id number] Referral, Other (Comment)  [food pantry bag]  Financial Strain Interventions [id number] Referral, Development worker, community, Other (Comment)  [cancer foundations]       SDOH Screenings   Alcohol Screen: Not on file  Depression (QBH4-1): Not on file  Financial Resource Strain: High Risk (06/14/2022)   Overall Financial Resource Strain (CARDIA)    Difficulty of Paying Living Expenses: Hard  Food Insecurity: Food Insecurity Present (06/14/2022)   Hunger Vital Sign    Worried About Charity fundraiser in the Last Year: Sometimes true    Ran Out of Food in the Last Year: Never true  Housing: Not on file  Physical Activity: Not on file  Social Connections: Not on file  Stress: Not on file  Tobacco Use: Medium Risk (06/06/2022)   Patient History    Smoking Tobacco Use: Former    Smokeless Tobacco Use: Never    Passive Exposure: Not on file  Transportation Needs: No Transportation Needs (06/14/2022)   PRAPARE - Hydrologist (Medical): No    Lack of Transportation (Non-Medical): No     Distress Screen completed: No    05/30/2022    1:40 PM  ONCBCN DISTRESS SCREENING  Screening Type Initial Screening  Distress experienced in past week (1-10) 7  Emotional problem type Depression;Nervousness/Anxiety  Physical Problem type Sleep/insomnia;Loss of appetitie;Tingling hands/feet;Skin dry/itchy  Referral to support programs Yes      Family/Social Information:  Housing Arrangement: patient lives with husband Transportation concerns: yes, cost of gas  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 Cannot access short-term disability until regular school year starts again .  Income source: husband's disability Financial concerns: Yes, due to illness and/or loss of work during treatment Type of concern: Utilities, Film/video editor, Transportation, Medical bills, and Food Food access concerns: yes, cost due to decreased income Religious or spiritual practice: Not known Services Currently in place:  none. Will have short-term disability when regular school-year restarts  Coping/ Adjustment to diagnosis: Patient understands treatment plan and what happens next? yes Concerns about diagnosis and/or treatment: How I will pay for the services I need Patient reported stressors: Finances and Transportation Current coping skills/ strengths: Capable of independent living  and Motivation for treatment/growth     SUMMARY: Current SDOH Barriers:  Financial constraints related to inability to work or access benefits during the summer due to treatment  Clinical Social Work Clinical Goal(s):  Freight forwarder options for unmet needs related to:  Financial Strain   Interventions: Provided CSW contact information and encouraged patient to call with any questions or concerns Provided applications for assistance from cancer foundations Referral submitted to [employer] for financial assistance Message sent to financial advocates re: Advertising account executive Provided food from [employer] Referrals to food pantries in [place] ([place], [place]) and [place] for other benefits (SNAP, utility assistance, etc)   Follow Up Plan: CSW will see patient on 7/18  to check on any questions that pt has on applications Patient verbalizes understanding of  plan: Yes    Nayara Taplin E Minette Manders, LCSW

## 2022-06-17 NOTE — Telephone Encounter (Addendum)
06/15/2022 Late entry. Form received by this nurse today.   Process completed with copy to bin designated for items to be scanned.  No further instructions received, actions required or performed by this nurse.   06/17/2022: Today, this nurse connected with CAMIYA VINAL daughter, Dima Mini [phone]) who will pick up envelope Monday, 06/20/2022.  Original copy to alphabetical file folder in appointment registration area near representative number one for pick up as requested.  Returned call to inform Dionza of correct appointment date and times, Tuesday, 06/21/2022 beginning at 9:45 am.

## 2022-06-20 MED FILL — Dexamethasone Sodium Phosphate Inj 100 MG/10ML: INTRAMUSCULAR | Qty: 1 | Status: AC

## 2022-06-21 ENCOUNTER — Other Ambulatory Visit: Payer: Self-pay

## 2022-06-21 ENCOUNTER — Inpatient Hospital Stay: Payer: BC Managed Care – PPO | Admitting: Licensed Clinical Social Worker

## 2022-06-21 ENCOUNTER — Inpatient Hospital Stay: Payer: BC Managed Care – PPO

## 2022-06-21 ENCOUNTER — Inpatient Hospital Stay (HOSPITAL_BASED_OUTPATIENT_CLINIC_OR_DEPARTMENT_OTHER): Payer: BC Managed Care – PPO | Admitting: Physician Assistant

## 2022-06-21 VITALS — BP 111/75 | HR 81 | Temp 98.4°F | Resp 15 | Wt 220.0 lb

## 2022-06-21 DIAGNOSIS — Z171 Estrogen receptor negative status [ER-]: Secondary | ICD-10-CM | POA: Diagnosis not present

## 2022-06-21 DIAGNOSIS — Z95828 Presence of other vascular implants and grafts: Secondary | ICD-10-CM

## 2022-06-21 DIAGNOSIS — C50212 Malignant neoplasm of upper-inner quadrant of left female breast: Secondary | ICD-10-CM | POA: Diagnosis not present

## 2022-06-21 DIAGNOSIS — Z5112 Encounter for antineoplastic immunotherapy: Secondary | ICD-10-CM | POA: Diagnosis not present

## 2022-06-21 LAB — CBC WITH DIFFERENTIAL (CANCER CENTER ONLY)
Abs Immature Granulocytes: 0 10*3/uL (ref 0.00–0.07)
Basophils Absolute: 0 10*3/uL (ref 0.0–0.1)
Basophils Relative: 1 %
Eosinophils Absolute: 0 10*3/uL (ref 0.0–0.5)
Eosinophils Relative: 1 %
HCT: 33.2 % — ABNORMAL LOW (ref 36.0–46.0)
Hemoglobin: 11.2 g/dL — ABNORMAL LOW (ref 12.0–15.0)
Immature Granulocytes: 0 %
Lymphocytes Relative: 57 %
Lymphs Abs: 1.8 10*3/uL (ref 0.7–4.0)
MCH: 29.6 pg (ref 26.0–34.0)
MCHC: 33.7 g/dL (ref 30.0–36.0)
MCV: 87.8 fL (ref 80.0–100.0)
Monocytes Absolute: 0.3 10*3/uL (ref 0.1–1.0)
Monocytes Relative: 8 %
Neutro Abs: 1 10*3/uL — ABNORMAL LOW (ref 1.7–7.7)
Neutrophils Relative %: 33 %
Platelet Count: 202 10*3/uL (ref 150–400)
RBC: 3.78 MIL/uL — ABNORMAL LOW (ref 3.87–5.11)
RDW: 12.9 % (ref 11.5–15.5)
WBC Count: 3.1 10*3/uL — ABNORMAL LOW (ref 4.0–10.5)
nRBC: 0 % (ref 0.0–0.2)

## 2022-06-21 LAB — CMP (CANCER CENTER ONLY)
ALT: 12 U/L (ref 0–44)
AST: 11 U/L — ABNORMAL LOW (ref 15–41)
Albumin: 3.8 g/dL (ref 3.5–5.0)
Alkaline Phosphatase: 84 U/L (ref 38–126)
Anion gap: 5 (ref 5–15)
BUN: 14 mg/dL (ref 8–23)
CO2: 28 mmol/L (ref 22–32)
Calcium: 9.1 mg/dL (ref 8.9–10.3)
Chloride: 106 mmol/L (ref 98–111)
Creatinine: 0.69 mg/dL (ref 0.44–1.00)
GFR, Estimated: >60 mL/min (ref >60)
Glucose, Bld: 158 mg/dL — ABNORMAL HIGH (ref 70–99)
Potassium: 3.8 mmol/L (ref 3.5–5.1)
Sodium: 139 mmol/L (ref 135–145)
Total Bilirubin: 0.2 mg/dL — ABNORMAL LOW (ref 0.3–1.2)
Total Protein: 6.7 g/dL (ref 6.5–8.1)

## 2022-06-21 LAB — TSH: TSH: 2.408 u[IU]/mL (ref 0.350–4.500)

## 2022-06-21 MED ORDER — SODIUM CHLORIDE 0.9 % IV SOLN: Freq: Once | INTRAVENOUS | Status: AC

## 2022-06-21 MED ORDER — SODIUM CHLORIDE 0.9% FLUSH
10.0000 mL | Freq: Once | INTRAVENOUS | Status: AC
  Administered 2022-06-21: 10 mL

## 2022-06-21 MED ORDER — DIPHENHYDRAMINE HCL 50 MG/ML IJ SOLN
50.0000 mg | Freq: Once | INTRAMUSCULAR | Status: AC
  Administered 2022-06-21: 50 mg via INTRAVENOUS
  Filled 2022-06-21: qty 1

## 2022-06-21 MED ORDER — HEPARIN SOD (PORK) LOCK FLUSH 100 UNIT/ML IV SOLN
500.0000 [IU] | Freq: Once | INTRAVENOUS | Status: AC | PRN
  Administered 2022-06-21: 500 [IU]

## 2022-06-21 MED ORDER — SODIUM CHLORIDE 0.9% FLUSH
10.0000 mL | INTRAVENOUS | Status: DC | PRN
  Administered 2022-06-21: 10 mL

## 2022-06-21 MED ORDER — SODIUM CHLORIDE 0.9 % IV SOLN
80.0000 mg/m2 | Freq: Once | INTRAVENOUS | Status: AC
  Administered 2022-06-21: 174 mg via INTRAVENOUS
  Filled 2022-06-21: qty 29

## 2022-06-21 MED ORDER — FAMOTIDINE IN NACL 20-0.9 MG/50ML-% IV SOLN
20.0000 mg | Freq: Once | INTRAVENOUS | Status: AC
  Administered 2022-06-21: 20 mg via INTRAVENOUS
  Filled 2022-06-21: qty 50

## 2022-06-21 MED ORDER — SODIUM CHLORIDE 0.9 % IV SOLN
10.0000 mg | Freq: Once | INTRAVENOUS | Status: AC
  Administered 2022-06-21: 10 mg via INTRAVENOUS
  Filled 2022-06-21: qty 10

## 2022-06-21 NOTE — Progress Notes (Signed)
[facility] CSW Progress Note  Holiday representative met with patient to provide ongoing support. Pt's mother, who was in hospice, died early this morning. CSW provided space for pt to process and share stories of her mom and family. Pt remains committed to continuing her treatment and surviving for her daughter and grandkids.  Pt has been contacted by [place] DSS and began [facility] and Medicaid applications. She has also started working on cancer foundation applications- reviewed briefly with CSW today.    Teryn Boerema E Charlyn Vialpando, LCSW

## 2022-06-21 NOTE — Patient Instructions (Signed)
[facility] ONCOLOGY  Discharge Instructions: Thank you for choosing [facility] to provide your oncology and hematology care.   If you have a lab appointment with the [facility], please go directly to the [facility] and check in at the registration area.   Wear comfortable clothing and clothing appropriate for easy access to any Portacath or PICC line.   We strive to give you quality time with your provider. You may need to reschedule your appointment if you arrive late (15 or more minutes).  Arriving late affects you and other patients whose appointments are after yours.  Also, if you miss three or more appointments without notifying the office, you may be dismissed from the clinic at the provider's discretion.      For prescription refill requests, have your pharmacy contact our office and allow 72 hours for refills to be completed.    Today you received the following chemotherapy and/or immunotherapy agents: Taxol.      To help prevent nausea and vomiting after your treatment, we encourage you to take your nausea medication as directed.  BELOW ARE SYMPTOMS THAT SHOULD BE REPORTED IMMEDIATELY: *FEVER GREATER THAN 100.4 F (38 C) OR HIGHER *CHILLS OR SWEATING *NAUSEA AND VOMITING THAT IS NOT CONTROLLED WITH YOUR NAUSEA MEDICATION *UNUSUAL SHORTNESS OF BREATH *UNUSUAL BRUISING OR BLEEDING *URINARY PROBLEMS (pain or burning when urinating, or frequent urination) *BOWEL PROBLEMS (unusual diarrhea, constipation, pain near the anus) TENDERNESS IN MOUTH AND THROAT WITH OR WITHOUT PRESENCE OF ULCERS (sore throat, sores in mouth, or a toothache) UNUSUAL RASH, SWELLING OR PAIN  UNUSUAL VAGINAL DISCHARGE OR ITCHING   Items with * indicate a potential emergency and should be followed up as soon as possible or go to the Emergency Department if any problems should occur.  Please show the CHEMOTHERAPY ALERT CARD or IMMUNOTHERAPY ALERT CARD at check-in to the  Emergency Department and triage nurse.  Should you have questions after your visit or need to cancel or reschedule your appointment, please contact [facility]  Dept: [phone]  and follow the prompts.  Office hours are 8:00 a.m. to 4:30 p.m. Monday - Friday. Please note that voicemails left after 4:00 p.m. may not be returned until the following business day.  We are closed weekends and major holidays. You have access to a nurse at all times for urgent questions. Please call the main number to the clinic Dept: [phone] and follow the prompts.   For any non-urgent questions, you may also contact your provider using MyChart. We now offer e-Visits for anyone 36 and older to request care online for non-urgent symptoms. For details visit [email or web address].   Also download the MyChart app! Go to the app store, search "MyChart", open the app, select [facility], and log in with your MyChart username and password.  Masks are optional in the cancer centers. If you would like for your care team to wear a mask while they are taking care of you, please let them know. For doctor visits, patients may have with them one support person who is at least 62 years old. At this time, visitors are not allowed in the infusion area.

## 2022-06-21 NOTE — Progress Notes (Signed)
Per Dede Query, PA okay to treat with ANC 1.0 K/uL.

## 2022-06-22 ENCOUNTER — Encounter: Payer: Self-pay | Admitting: Hematology and Oncology

## 2022-06-22 ENCOUNTER — Inpatient Hospital Stay: Payer: BC Managed Care – PPO

## 2022-06-22 ENCOUNTER — Encounter: Payer: Self-pay | Admitting: Genetic Counselor

## 2022-06-22 ENCOUNTER — Telehealth: Payer: Self-pay

## 2022-06-22 VITALS — BP 128/76 | HR 86 | Temp 98.4°F | Resp 18

## 2022-06-22 DIAGNOSIS — C50212 Malignant neoplasm of upper-inner quadrant of left female breast: Secondary | ICD-10-CM

## 2022-06-22 DIAGNOSIS — Z5112 Encounter for antineoplastic immunotherapy: Secondary | ICD-10-CM | POA: Diagnosis not present

## 2022-06-22 LAB — T4: T4, Total: 7 ug/dL (ref 4.5–12.0)

## 2022-06-22 MED ORDER — FILGRASTIM-AAFI 480 MCG/0.8ML IJ SOSY
480.0000 ug | PREFILLED_SYRINGE | Freq: Once | INTRAMUSCULAR | Status: AC
  Administered 2022-06-22: 480 ug via SUBCUTANEOUS
  Filled 2022-06-22: qty 0.8

## 2022-06-22 NOTE — Progress Notes (Signed)
Sydney Lee:    Sydney Lee, [address] [place] [id number]   DIAGNOSIS:  Cancer Staging  Malignant neoplasm of upper-inner quadrant of left breast in female, estrogen receptor negative ([place]) Staging form: Breast, AJCC 8th Edition - Clinical: Stage IIB (cT2, cN0, cM0, G3, ER-, PR-, HER2-) - Signed by Sydney Pike, MD on 05/25/2022 Stage prefix: Initial diagnosis Histologic grading system: 3 grade system   SUMMARY OF ONCOLOGIC HISTORY: Oncology History  Malignant neoplasm of upper-inner quadrant of left breast in female, estrogen receptor negative ([place])  04/27/2022 Mammogram   Diagnostic mammogram showed indeterminate solid mass in the 10:00 location of the left breast.  Small satellite nodule adjacent to the index mass.  Borderline lymph node has cortical thickening of 2.9 mm.  Other lymph nodes have normal morphology.   05/16/2022 Pathology Results   Left breast needle core biopsy showed invasive poorly differentiated adenocarcinoma, grade 3 with tumor necrosis, lymph node biopsy benign reactive, negative for carcinoma.  Prognostics from the tumor showed ER 0%, negative, PR 0%, negative, Ki-67 of 80% and HER2 negative   05/23/2022 Initial Diagnosis   Malignant neoplasm of upper-inner quadrant of left breast in female, estrogen receptor negative ([place])   05/25/2022 Cancer Staging   Staging form: Breast, AJCC 8th Edition - Clinical: Stage IIB (cT2, cN0, cM0, G3, ER-, PR-, HER2-) - Signed by Sydney Pike, MD on 05/25/2022 Stage prefix: Initial diagnosis Histologic grading system: 3 grade system   05/25/2022 Genetic Testing   [facility] CancerNext-Expanded Panel was Negative. Report date was 06/02/2022.  The CancerNext-Expanded gene panel offered by [facility] and includes sequencing, rearrangement, and RNA analysis for the following 77 genes: AIP, ALK, APC, ATM, AXIN2, BAP1, BARD1, BLM, BMPR1A, BRCA1, BRCA2, BRIP1, CDC73, CDH1, CDK4, CDKN1B,  CDKN2A, CHEK2, CTNNA1, DICER1, FANCC, FH, FLCN, GALNT12, KIF1B, LZTR1, MAX, MEN1, MET, MLH1, MSH2, MSH3, MSH6, MUTYH, NBN, NF1, NF2, NTHL1, PALB2, PHOX2B, PMS2, POT1, PRKAR1A, PTCH1, PTEN, RAD51C, RAD51D, RB1, RECQL, RET, SDHA, SDHAF2, SDHB, SDHC, SDHD, SMAD4, SMARCA4, SMARCB1, SMARCE1, STK11, SUFU, TMEM127, TP53, TSC1, TSC2, VHL and XRCC2 (sequencing and deletion/duplication); EGFR, EGLN1, HOXB13, KIT, MITF, PDGFRA, POLD1, and POLE (sequencing only); EPCAM and GREM1 (deletion/duplication only).    06/02/2022 Initial Biopsy   Additional left axillary node biopsy   06/06/2022 -  Chemotherapy   Patient is on Treatment Plan : BREAST Pembrolizumab (200) D1 + Carboplatin (5) D1 + Paclitaxel (80) D1,8,15 q21d X 4 cycles / Pembrolizumab (200) D1 + AC D1 q21d x 4 cycles       CURRENT THERAPY: Taxol/Carbo/Keytruda  INTERVAL HISTORY: Sydney Lee 61 y.o. female returns for follow-Lee and evaluation prior to Cycle 1, Day 15 of chemotherapy with Taxol. She is unaccompanied for this visit.   [name] reports that her mother passed away earlier today. She was in hospice for advanced cancer that was recently diagnosed. She wanted to proceed with treatment as this is what her mother would have wished. She is fortunate to have great support from her family. She reports that her energy and appetite are stable while on chemotherapy. She does have some fatigue with chemotherapy but is able to complete her daily activities on her own. She denies any nausea, vomiting or abdominal pain. Her bowel habits are regular without any recurrent episodes of diarrhea or constipation. She denies easy bruising or signs of bleeding. She reports occasional episodes of chronic low back pain. She denies any fevers, chills, sweats, shortness of breath,  chest pain or cough. She has no other complaints.   Patient Active Problem List   Diagnosis Date Noted   Port-A-Cath in place 06/06/2022   Infusion reaction 06/06/2022   Genetic testing  06/03/2022   Family history of breast cancer 05/25/2022   Family history of ovarian cancer 05/25/2022   Malignant neoplasm of upper-inner quadrant of left breast in female, estrogen receptor negative ([place]) 05/23/2022    is allergic to [facility] [aprepitant], codeine, and latex.  MEDICAL HISTORY: Past Medical History:  Diagnosis Date   Anxiety    Arthritis    Breast cancer ([place])    Depression    Diabetes mellitus without complication ([place])    Hypertension     SURGICAL HISTORY: Past Surgical History:  Procedure Laterality Date   PORTACATH PLACEMENT N/A 06/01/2022   Procedure: INSERTION PORT-A-CATH WITH ULTRASOUND GUIDANCE;  Surgeon: Sydney Klein, MD;  Location: [facility];  Service: General;  Laterality: N/A;    SOCIAL HISTORY: Social History   Socioeconomic History   Marital status: Married    Spouse name: Not on file   Number of children: Not on file   Years of education: Not on file   Highest education level: Not on file  Occupational History   Not on file  Tobacco Use   Smoking status: Former    Packs/day: 1.00    Years: 40.00    Total pack years: 40.00    Types: Cigarettes    Quit date: 06/05/2022    Years since quitting: 0.0   Smokeless tobacco: Never  Substance and Sexual Activity   Alcohol use: No   Drug use: No   Sexual activity: Not on file  Other Topics Concern   Not on file  Social History Narrative   Not on file   Social Determinants of Health   Financial Resource Strain: High Risk (06/14/2022)   Overall Financial Resource Strain (CARDIA)    Difficulty of Paying Living Expenses: Hard  Food Insecurity: Food Insecurity Present (06/14/2022)   Hunger Vital Sign    Worried About Running Out of Food in the Last Year: Sometimes true    Ran Out of Food in the Last Year: Never true  Transportation Needs: No Transportation Needs (06/14/2022)   PRAPARE - Hydrologist (Medical): No    Lack of Transportation (Non-Medical): No  Physical  Activity: Not on file  Stress: Not on file  Social Connections: Not on file  Intimate Partner Violence: Not on file    FAMILY HISTORY: Family History  Problem Relation Age of Onset   Leukemia Mother 47   Throat cancer Maternal Uncle 85   Prostate cancer Maternal Uncle    Breast cancer Cousin 24       maternal first cousin   Ovarian cancer Cousin        maternal first cousin   Breast cancer Cousin 9 - 8       paternal first cousin   Breast cancer Cousin 61 - 80       paternal first cousin    Review of Systems  Constitutional:  Negative for appetite change, chills, fatigue, fever and unexpected weight change.  HENT:   Negative for hearing loss, lump/mass and trouble swallowing.   Eyes:  Negative for eye problems and icterus.  Respiratory:  Negative for chest tightness, cough and shortness of breath.   Cardiovascular:  Negative for chest pain, leg swelling and palpitations.  Gastrointestinal:  Negative for abdominal distention, abdominal pain, constipation,  diarrhea, nausea and vomiting.  Endocrine: Negative for hot flashes.  Genitourinary:  Negative for difficulty urinating.   Musculoskeletal:  Negative for arthralgias.  Skin:  Negative for itching and rash.  Neurological:  Negative for dizziness, extremity weakness, headaches and numbness.  Hematological:  Negative for adenopathy. Does not bruise/bleed easily.  Psychiatric/Behavioral:  Negative for depression. The patient is not nervous/anxious.       PHYSICAL EXAMINATION  ECOG PERFORMANCE STATUS: 1 - Symptomatic but completely ambulatory  Vitals:   06/21/22 1020  BP: 111/75  Pulse: 81  Resp: 15  Temp: 98.4 F (36.9 C)  SpO2: 99%    Physical Exam Constitutional:      General: She is not in acute distress.    Appearance: Normal appearance. She is not toxic-appearing.  HENT:     Head: Normocephalic and atraumatic.  Eyes:     General: No scleral icterus. Cardiovascular:     Rate and Rhythm: Normal rate and  regular rhythm.     Pulses: Normal pulses.     Heart sounds: Normal heart sounds.  Pulmonary:     Effort: Pulmonary effort is normal.     Breath sounds: Normal breath sounds.  Chest:     Comments: Left upper inner breast with tumor noted, stable.  Abdominal:     General: Abdomen is flat. Bowel sounds are normal. There is no distension.     Palpations: Abdomen is soft.     Tenderness: There is no abdominal tenderness.  Musculoskeletal:        General: No swelling.     Cervical back: Neck supple.  Lymphadenopathy:     Cervical: No cervical adenopathy.  Skin:    General: Skin is warm and dry.     Findings: No rash.  Neurological:     General: No focal deficit present.     Mental Status: She is alert.  Psychiatric:        Mood and Affect: Mood normal.        Behavior: Behavior normal.    LABORATORY DATA:  CBC    Component Value Date/Time   WBC 3.1 (L) 06/21/2022 1002   WBC 4.8 12/22/2018 2150   RBC 3.78 (L) 06/21/2022 1002   HGB 11.2 (L) 06/21/2022 1002   HCT 33.2 (L) 06/21/2022 1002   PLT 202 06/21/2022 1002   MCV 87.8 06/21/2022 1002   MCH 29.6 06/21/2022 1002   MCHC 33.7 06/21/2022 1002   RDW 12.9 06/21/2022 1002   LYMPHSABS 1.8 06/21/2022 1002   MONOABS 0.3 06/21/2022 1002   EOSABS 0.0 06/21/2022 1002   BASOSABS 0.0 06/21/2022 1002    CMP     Component Value Date/Time   NA 139 06/21/2022 1002   K 3.8 06/21/2022 1002   CL 106 06/21/2022 1002   CO2 28 06/21/2022 1002   GLUCOSE 158 (H) 06/21/2022 1002   BUN 14 06/21/2022 1002   CREATININE 0.69 06/21/2022 1002   CALCIUM 9.1 06/21/2022 1002   PROT 6.7 06/21/2022 1002   ALBUMIN 3.8 06/21/2022 1002   AST 11 (L) 06/21/2022 1002   ALT 12 06/21/2022 1002   ALKPHOS 84 06/21/2022 1002   BILITOT 0.2 (L) 06/21/2022 1002   GFRNONAA >60 06/21/2022 1002   GFRAA >60 12/22/2018 2150       ASSESSMENT and THERAPY PLAN:  Sydney Lee is a 61 y.o. female who presents to the clinic for stage IIb triple negative  breast cancer.   #Stage IIb triple negative breast cancer  --Currently  receiving neoadjuvant chemotherapy with carbo/taxol and keytruda x 4 cycles followed by adriamycin, cytoxan and keytruda x 4 cycles.  --Due for Cycle 1, Day 15 of taxol today.  --Labs from today were reviewed and adequate for treatment today.  --RTC on 07/05/2022 with port labs, f/u visit with Dr. Chryl Heck before Cycle 2, Day 8 of chemotherapy.    All questions were answered. The patient knows to call the clinic with any problems, questions or concerns. We can certainly see the patient much sooner if necessary.  I have spent a total of 30 minutes minutes of face-to-face and non-face-to-face time, preparing to see the patient, performing a medically appropriate examination, counseling and educating the patient, documenting clinical information in the electronic health record, and care coordination.   Dede Query PA-C Dept of Hematology and [place] at [facility] Phone: [phone]

## 2022-06-22 NOTE — Telephone Encounter (Signed)
Attempted to contact patient regarding [employer]. Left message on voicemail requesting a return call.

## 2022-06-23 ENCOUNTER — Encounter: Payer: Self-pay | Admitting: Hematology and Oncology

## 2022-06-23 ENCOUNTER — Inpatient Hospital Stay: Payer: BC Managed Care – PPO

## 2022-06-23 ENCOUNTER — Other Ambulatory Visit: Payer: Self-pay

## 2022-06-23 VITALS — BP 110/65 | HR 93 | Temp 98.9°F | Resp 18

## 2022-06-23 DIAGNOSIS — Z171 Estrogen receptor negative status [ER-]: Secondary | ICD-10-CM

## 2022-06-23 DIAGNOSIS — Z5112 Encounter for antineoplastic immunotherapy: Secondary | ICD-10-CM | POA: Diagnosis not present

## 2022-06-23 MED ORDER — FILGRASTIM-AAFI 480 MCG/0.8ML IJ SOSY
480.0000 ug | PREFILLED_SYRINGE | Freq: Once | INTRAMUSCULAR | Status: AC
  Administered 2022-06-23: 480 ug via SUBCUTANEOUS
  Filled 2022-06-23: qty 0.8

## 2022-06-23 NOTE — Progress Notes (Signed)
Met with patient in person to obtain grant signature. Patient approved for one-time $1000 [employer] grant to assist with personal expenses while going through treatment. She has a copy of the approval letter and expense sheet with Outpatient pharmacy information in green folder along with my card. Patient received a gift card today from her grant.  Discussed in detail expenses and how they are covered and how to submit.  She has my card for any additional financial questions or concerns.  Left [employer] application for patient to sign tomorrow in brown folder at registration. Left provider portion w/ Val RN to have provider sign and have returned to me via scan/email.

## 2022-06-24 ENCOUNTER — Telehealth: Payer: Self-pay | Admitting: Hematology and Oncology

## 2022-06-24 ENCOUNTER — Inpatient Hospital Stay: Payer: BC Managed Care – PPO

## 2022-06-24 VITALS — BP 114/68 | HR 100 | Temp 99.5°F | Resp 20

## 2022-06-24 DIAGNOSIS — Z5112 Encounter for antineoplastic immunotherapy: Secondary | ICD-10-CM | POA: Diagnosis not present

## 2022-06-24 DIAGNOSIS — C50212 Malignant neoplasm of upper-inner quadrant of left female breast: Secondary | ICD-10-CM

## 2022-06-24 MED ORDER — FILGRASTIM-AAFI 480 MCG/0.8ML IJ SOSY
480.0000 ug | PREFILLED_SYRINGE | Freq: Once | INTRAMUSCULAR | Status: AC
  Administered 2022-06-24: 480 ug via SUBCUTANEOUS
  Filled 2022-06-24: qty 0.8

## 2022-06-24 NOTE — Telephone Encounter (Signed)
Per 7/21 phone line pt called to r/s appointment due to a death in the family.  Time ok per charge for infusion

## 2022-06-27 ENCOUNTER — Other Ambulatory Visit: Payer: Self-pay

## 2022-06-27 ENCOUNTER — Other Ambulatory Visit: Payer: Self-pay | Admitting: Hematology and Oncology

## 2022-06-27 ENCOUNTER — Inpatient Hospital Stay: Payer: BC Managed Care – PPO

## 2022-06-27 VITALS — BP 131/83 | HR 76 | Temp 98.5°F | Resp 18 | Wt 218.2 lb

## 2022-06-27 DIAGNOSIS — Z95828 Presence of other vascular implants and grafts: Secondary | ICD-10-CM

## 2022-06-27 DIAGNOSIS — Z5112 Encounter for antineoplastic immunotherapy: Secondary | ICD-10-CM | POA: Diagnosis not present

## 2022-06-27 DIAGNOSIS — C50212 Malignant neoplasm of upper-inner quadrant of left female breast: Secondary | ICD-10-CM

## 2022-06-27 LAB — CBC WITH DIFFERENTIAL (CANCER CENTER ONLY)
Abs Immature Granulocytes: 0.03 10*3/uL (ref 0.00–0.07)
Basophils Absolute: 0 10*3/uL (ref 0.0–0.1)
Basophils Relative: 1 %
Eosinophils Absolute: 0.1 10*3/uL (ref 0.0–0.5)
Eosinophils Relative: 2 %
HCT: 32.2 % — ABNORMAL LOW (ref 36.0–46.0)
Hemoglobin: 10.9 g/dL — ABNORMAL LOW (ref 12.0–15.0)
Immature Granulocytes: 1 %
Lymphocytes Relative: 50 %
Lymphs Abs: 2.1 10*3/uL (ref 0.7–4.0)
MCH: 29.9 pg (ref 26.0–34.0)
MCHC: 33.9 g/dL (ref 30.0–36.0)
MCV: 88.5 fL (ref 80.0–100.0)
Monocytes Absolute: 0.4 10*3/uL (ref 0.1–1.0)
Monocytes Relative: 11 %
Neutro Abs: 1.4 10*3/uL — ABNORMAL LOW (ref 1.7–7.7)
Neutrophils Relative %: 35 %
Platelet Count: 167 10*3/uL (ref 150–400)
RBC: 3.64 MIL/uL — ABNORMAL LOW (ref 3.87–5.11)
RDW: 13.6 % (ref 11.5–15.5)
WBC Count: 4.1 10*3/uL (ref 4.0–10.5)
nRBC: 0 % (ref 0.0–0.2)

## 2022-06-27 LAB — CMP (CANCER CENTER ONLY)
ALT: 11 U/L (ref 0–44)
AST: 12 U/L — ABNORMAL LOW (ref 15–41)
Albumin: 3.9 g/dL (ref 3.5–5.0)
Alkaline Phosphatase: 98 U/L (ref 38–126)
Anion gap: 5 (ref 5–15)
BUN: 6 mg/dL — ABNORMAL LOW (ref 8–23)
CO2: 29 mmol/L (ref 22–32)
Calcium: 9.2 mg/dL (ref 8.9–10.3)
Chloride: 109 mmol/L (ref 98–111)
Creatinine: 0.67 mg/dL (ref 0.44–1.00)
GFR, Estimated: >60 mL/min (ref >60)
Glucose, Bld: 115 mg/dL — ABNORMAL HIGH (ref 70–99)
Potassium: 3.6 mmol/L (ref 3.5–5.1)
Sodium: 143 mmol/L (ref 135–145)
Total Bilirubin: 0.3 mg/dL (ref 0.3–1.2)
Total Protein: 6.5 g/dL (ref 6.5–8.1)

## 2022-06-27 LAB — TSH: TSH: 1.142 u[IU]/mL (ref 0.350–4.500)

## 2022-06-27 MED ORDER — SODIUM CHLORIDE 0.9% FLUSH
10.0000 mL | Freq: Once | INTRAVENOUS | Status: AC
  Administered 2022-06-27: 10 mL

## 2022-06-27 MED ORDER — SODIUM CHLORIDE 0.9 % IV SOLN
10.0000 mg | Freq: Once | INTRAVENOUS | Status: AC
  Administered 2022-06-27: 10 mg via INTRAVENOUS
  Filled 2022-06-27: qty 10

## 2022-06-27 MED ORDER — FAMOTIDINE IN NACL 20-0.9 MG/50ML-% IV SOLN
20.0000 mg | Freq: Once | INTRAVENOUS | Status: AC
  Administered 2022-06-27: 20 mg via INTRAVENOUS
  Filled 2022-06-27: qty 50

## 2022-06-27 MED ORDER — PALONOSETRON HCL INJECTION 0.25 MG/5ML
0.2500 mg | Freq: Once | INTRAVENOUS | Status: AC
  Administered 2022-06-27: 0.25 mg via INTRAVENOUS
  Filled 2022-06-27: qty 5

## 2022-06-27 MED ORDER — SODIUM CHLORIDE 0.9 % IV SOLN
702.0000 mg | Freq: Once | INTRAVENOUS | Status: AC
  Administered 2022-06-27: 700 mg via INTRAVENOUS
  Filled 2022-06-27: qty 70

## 2022-06-27 MED ORDER — SODIUM CHLORIDE 0.9 % IV SOLN
200.0000 mg | Freq: Once | INTRAVENOUS | Status: AC
  Administered 2022-06-27: 200 mg via INTRAVENOUS
  Filled 2022-06-27: qty 8

## 2022-06-27 MED ORDER — SODIUM CHLORIDE 0.9 % IV SOLN: Freq: Once | INTRAVENOUS | Status: AC

## 2022-06-27 MED ORDER — DIPHENHYDRAMINE HCL 50 MG/ML IJ SOLN
50.0000 mg | Freq: Once | INTRAMUSCULAR | Status: AC
  Administered 2022-06-27: 50 mg via INTRAVENOUS
  Filled 2022-06-27: qty 1

## 2022-06-27 MED ORDER — SODIUM CHLORIDE 0.9% FLUSH
10.0000 mL | INTRAVENOUS | Status: DC | PRN
  Administered 2022-06-27: 10 mL

## 2022-06-27 MED ORDER — HEPARIN SOD (PORK) LOCK FLUSH 100 UNIT/ML IV SOLN
500.0000 [IU] | Freq: Once | INTRAVENOUS | Status: AC | PRN
  Administered 2022-06-27: 500 [IU]

## 2022-06-27 MED ORDER — SODIUM CHLORIDE 0.9 % IV SOLN
80.0000 mg/m2 | Freq: Once | INTRAVENOUS | Status: AC
  Administered 2022-06-27: 174 mg via INTRAVENOUS
  Filled 2022-06-27: qty 29

## 2022-06-27 NOTE — Progress Notes (Signed)
Treatment plan signed. Ok to proceed with ANC of 1.4

## 2022-06-27 NOTE — Progress Notes (Signed)
Per Dr. Chryl Heck, ok to treat with low ANC

## 2022-06-27 NOTE — Patient Instructions (Signed)
[facility] ONCOLOGY  Discharge Instructions: Thank you for choosing [facility] to provide your oncology and hematology care.   If you have a lab appointment with the [facility], please go directly to the [facility] and check in at the registration area.   Wear comfortable clothing and clothing appropriate for easy access to any Portacath or PICC line.   We strive to give you quality time with your provider. You may need to reschedule your appointment if you arrive late (15 or more minutes).  Arriving late affects you and other patients whose appointments are after yours.  Also, if you miss three or more appointments without notifying the office, you may be dismissed from the clinic at the provider's discretion.      For prescription refill requests, have your pharmacy contact our office and allow 72 hours for refills to be completed.    Today you received the following chemotherapy and/or immunotherapy agents keytruda, taxol, Sydney Lee      To help prevent nausea and vomiting after your treatment, we encourage you to take your nausea medication as directed.  BELOW ARE SYMPTOMS THAT SHOULD BE REPORTED IMMEDIATELY: *FEVER GREATER THAN 100.4 F (38 C) OR HIGHER *CHILLS OR SWEATING *NAUSEA AND VOMITING THAT IS NOT CONTROLLED WITH YOUR NAUSEA MEDICATION *UNUSUAL SHORTNESS OF BREATH *UNUSUAL BRUISING OR BLEEDING *URINARY PROBLEMS (pain or burning when urinating, or frequent urination) *BOWEL PROBLEMS (unusual diarrhea, constipation, pain near the anus) TENDERNESS IN MOUTH AND THROAT WITH OR WITHOUT PRESENCE OF ULCERS (sore throat, sores in mouth, or a toothache) UNUSUAL RASH, SWELLING OR PAIN  UNUSUAL VAGINAL DISCHARGE OR ITCHING   Items with * indicate a potential emergency and should be followed up as soon as possible or go to the Emergency Department if any problems should occur.  Please show the CHEMOTHERAPY ALERT CARD or IMMUNOTHERAPY ALERT CARD at  check-in to the Emergency Department and triage nurse.  Should you have questions after your visit or need to cancel or reschedule your appointment, please contact [facility]  Dept: [phone]  and follow the prompts.  Office hours are 8:00 a.m. to 4:30 p.m. Monday - Friday. Please note that voicemails left after 4:00 p.m. may not be returned until the following business day.  We are closed weekends and major holidays. You have access to a nurse at all times for urgent questions. Please call the main number to the clinic Dept: [phone] and follow the prompts.   For any non-urgent questions, you may also contact your provider using MyChart. We now offer e-Visits for anyone 45 and older to request care online for non-urgent symptoms. For details visit [email or web address].   Also download the MyChart app! Go to the app store, search "MyChart", open the app, select [facility], and log in with your MyChart username and password.  Masks are optional in the cancer centers. If you would like for your care team to wear a mask while they are taking care of you, please let them know. For doctor visits, patients may have with them one support person who is at least 62 years old. At this time, visitors are not allowed in the infusion area.

## 2022-06-28 ENCOUNTER — Inpatient Hospital Stay: Payer: BC Managed Care – PPO

## 2022-06-28 LAB — T4: T4, Total: 7.5 ug/dL (ref 4.5–12.0)

## 2022-06-30 ENCOUNTER — Other Ambulatory Visit: Payer: Self-pay

## 2022-07-04 MED FILL — Dexamethasone Sodium Phosphate Inj 100 MG/10ML: INTRAMUSCULAR | Qty: 1 | Status: AC

## 2022-07-05 ENCOUNTER — Encounter: Payer: Self-pay | Admitting: Hematology and Oncology

## 2022-07-05 ENCOUNTER — Inpatient Hospital Stay: Payer: BC Managed Care – PPO

## 2022-07-05 ENCOUNTER — Inpatient Hospital Stay: Payer: BC Managed Care – PPO | Attending: Hematology and Oncology | Admitting: Hematology and Oncology

## 2022-07-05 ENCOUNTER — Other Ambulatory Visit: Payer: Self-pay

## 2022-07-05 VITALS — BP 109/72 | HR 98 | Temp 98.5°F | Resp 16

## 2022-07-05 VITALS — BP 109/72 | HR 100 | Temp 97.8°F | Resp 16 | Ht 69.0 in | Wt 207.4 lb

## 2022-07-05 DIAGNOSIS — Z452 Encounter for adjustment and management of vascular access device: Secondary | ICD-10-CM | POA: Diagnosis not present

## 2022-07-05 DIAGNOSIS — Z95828 Presence of other vascular implants and grafts: Secondary | ICD-10-CM

## 2022-07-05 DIAGNOSIS — C50212 Malignant neoplasm of upper-inner quadrant of left female breast: Secondary | ICD-10-CM

## 2022-07-05 DIAGNOSIS — D6959 Other secondary thrombocytopenia: Secondary | ICD-10-CM | POA: Diagnosis not present

## 2022-07-05 DIAGNOSIS — Z5189 Encounter for other specified aftercare: Secondary | ICD-10-CM | POA: Insufficient documentation

## 2022-07-05 DIAGNOSIS — T451X5A Adverse effect of antineoplastic and immunosuppressive drugs, initial encounter: Secondary | ICD-10-CM | POA: Diagnosis not present

## 2022-07-05 DIAGNOSIS — Z5111 Encounter for antineoplastic chemotherapy: Secondary | ICD-10-CM | POA: Insufficient documentation

## 2022-07-05 DIAGNOSIS — D701 Agranulocytosis secondary to cancer chemotherapy: Secondary | ICD-10-CM | POA: Insufficient documentation

## 2022-07-05 DIAGNOSIS — Z171 Estrogen receptor negative status [ER-]: Secondary | ICD-10-CM | POA: Diagnosis not present

## 2022-07-05 DIAGNOSIS — Z5112 Encounter for antineoplastic immunotherapy: Secondary | ICD-10-CM | POA: Insufficient documentation

## 2022-07-05 DIAGNOSIS — Z79899 Other long term (current) drug therapy: Secondary | ICD-10-CM | POA: Insufficient documentation

## 2022-07-05 DIAGNOSIS — K521 Toxic gastroenteritis and colitis: Secondary | ICD-10-CM | POA: Insufficient documentation

## 2022-07-05 LAB — CMP (CANCER CENTER ONLY)
ALT: 19 U/L (ref 0–44)
AST: 14 U/L — ABNORMAL LOW (ref 15–41)
Albumin: 4.1 g/dL (ref 3.5–5.0)
Alkaline Phosphatase: 77 U/L (ref 38–126)
Anion gap: 8 (ref 5–15)
BUN: 13 mg/dL (ref 8–23)
CO2: 26 mmol/L (ref 22–32)
Calcium: 9 mg/dL (ref 8.9–10.3)
Chloride: 102 mmol/L (ref 98–111)
Creatinine: 0.69 mg/dL (ref 0.44–1.00)
GFR, Estimated: >60 mL/min (ref >60)
Glucose, Bld: 161 mg/dL — ABNORMAL HIGH (ref 70–99)
Potassium: 3.4 mmol/L — ABNORMAL LOW (ref 3.5–5.1)
Sodium: 136 mmol/L (ref 135–145)
Total Bilirubin: 0.7 mg/dL (ref 0.3–1.2)
Total Protein: 7.1 g/dL (ref 6.5–8.1)

## 2022-07-05 LAB — CBC WITH DIFFERENTIAL (CANCER CENTER ONLY)
Abs Immature Granulocytes: 0 10*3/uL (ref 0.00–0.07)
Basophils Absolute: 0 10*3/uL (ref 0.0–0.1)
Basophils Relative: 0 %
Eosinophils Absolute: 0 10*3/uL (ref 0.0–0.5)
Eosinophils Relative: 0 %
HCT: 33.2 % — ABNORMAL LOW (ref 36.0–46.0)
Hemoglobin: 11.6 g/dL — ABNORMAL LOW (ref 12.0–15.0)
Immature Granulocytes: 0 %
Lymphocytes Relative: 51 %
Lymphs Abs: 1.6 10*3/uL (ref 0.7–4.0)
MCH: 30 pg (ref 26.0–34.0)
MCHC: 34.9 g/dL (ref 30.0–36.0)
MCV: 85.8 fL (ref 80.0–100.0)
Monocytes Absolute: 0.2 10*3/uL (ref 0.1–1.0)
Monocytes Relative: 5 %
Neutro Abs: 1.4 10*3/uL — ABNORMAL LOW (ref 1.7–7.7)
Neutrophils Relative %: 44 %
Platelet Count: 190 10*3/uL (ref 150–400)
RBC: 3.87 MIL/uL (ref 3.87–5.11)
RDW: 13.1 % (ref 11.5–15.5)
WBC Count: 3.2 10*3/uL — ABNORMAL LOW (ref 4.0–10.5)
nRBC: 0 % (ref 0.0–0.2)

## 2022-07-05 LAB — TSH: TSH: 0.845 u[IU]/mL (ref 0.350–4.500)

## 2022-07-05 MED ORDER — SODIUM CHLORIDE 0.9 % IV SOLN: Freq: Once | INTRAVENOUS | Status: AC

## 2022-07-05 MED ORDER — FAMOTIDINE IN NACL 20-0.9 MG/50ML-% IV SOLN
20.0000 mg | Freq: Once | INTRAVENOUS | Status: AC
  Administered 2022-07-05: 20 mg via INTRAVENOUS
  Filled 2022-07-05: qty 50

## 2022-07-05 MED ORDER — SODIUM CHLORIDE 0.9% FLUSH
10.0000 mL | INTRAVENOUS | Status: DC | PRN
  Administered 2022-07-05: 10 mL

## 2022-07-05 MED ORDER — DIPHENHYDRAMINE HCL 50 MG/ML IJ SOLN
50.0000 mg | Freq: Once | INTRAMUSCULAR | Status: AC
  Administered 2022-07-05: 50 mg via INTRAVENOUS
  Filled 2022-07-05: qty 1

## 2022-07-05 MED ORDER — SODIUM CHLORIDE 0.9% FLUSH
10.0000 mL | Freq: Once | INTRAVENOUS | Status: AC
  Administered 2022-07-05: 10 mL

## 2022-07-05 MED ORDER — SODIUM CHLORIDE 0.9 % IV SOLN
80.0000 mg/m2 | Freq: Once | INTRAVENOUS | Status: AC
  Administered 2022-07-05: 174 mg via INTRAVENOUS
  Filled 2022-07-05: qty 29

## 2022-07-05 MED ORDER — SODIUM CHLORIDE 0.9 % IV SOLN
10.0000 mg | Freq: Once | INTRAVENOUS | Status: AC
  Administered 2022-07-05: 10 mg via INTRAVENOUS
  Filled 2022-07-05: qty 10

## 2022-07-05 MED ORDER — HEPARIN SOD (PORK) LOCK FLUSH 100 UNIT/ML IV SOLN
500.0000 [IU] | Freq: Once | INTRAVENOUS | Status: AC | PRN
  Administered 2022-07-05: 500 [IU]

## 2022-07-05 NOTE — Progress Notes (Signed)
[facility] Follow up:    Sydney Lee, [address] [place] [address]   DIAGNOSIS:  Cancer Staging  Malignant neoplasm of upper-inner quadrant of left breast in female, estrogen receptor negative ([place]) Staging form: Breast, AJCC 8th Edition - Clinical: Stage IIB (cT2, cN0, cM0, G3, ER-, PR-, HER2-) - Signed by Benay Pike, MD on 05/25/2022 Stage prefix: Initial diagnosis Histologic grading system: 3 grade system   SUMMARY OF ONCOLOGIC HISTORY: Oncology History  Malignant neoplasm of upper-inner quadrant of left breast in female, estrogen receptor negative ([place])  04/27/2022 Mammogram   Diagnostic mammogram showed indeterminate solid mass in the 10:00 location of the left breast.  Small satellite nodule adjacent to the index mass.  Borderline lymph node has cortical thickening of 2.9 mm.  Other lymph nodes have normal morphology.   05/16/2022 Pathology Results   Left breast needle core biopsy showed invasive poorly differentiated adenocarcinoma, grade 3 with tumor necrosis, lymph node biopsy benign reactive, negative for carcinoma.  Prognostics from the tumor showed ER 0%, negative, PR 0%, negative, Ki-67 of 80% and HER2 negative   05/23/2022 Initial Diagnosis   Malignant neoplasm of upper-inner quadrant of left breast in female, estrogen receptor negative ([place])   05/25/2022 Cancer Staging   Staging form: Breast, AJCC 8th Edition - Clinical: Stage IIB (cT2, cN0, cM0, G3, ER-, PR-, HER2-) - Signed by Benay Pike, MD on 05/25/2022 Stage prefix: Initial diagnosis Histologic grading system: 3 grade system   05/25/2022 Genetic Testing   [facility] CancerNext-Expanded Panel was Negative. Report date was 06/02/2022.  The CancerNext-Expanded gene panel offered by [facility] and includes sequencing, rearrangement, and RNA analysis for the following 77 genes: AIP, ALK, APC, ATM, AXIN2, BAP1, BARD1, BLM, BMPR1A, BRCA1, BRCA2, BRIP1, CDC73, CDH1, CDK4, CDKN1B,  CDKN2A, CHEK2, CTNNA1, DICER1, FANCC, FH, FLCN, GALNT12, KIF1B, LZTR1, MAX, MEN1, MET, MLH1, MSH2, MSH3, MSH6, MUTYH, NBN, NF1, NF2, NTHL1, PALB2, PHOX2B, PMS2, POT1, PRKAR1A, PTCH1, PTEN, RAD51C, RAD51D, RB1, RECQL, RET, SDHA, SDHAF2, SDHB, SDHC, SDHD, SMAD4, SMARCA4, SMARCB1, SMARCE1, STK11, SUFU, TMEM127, TP53, TSC1, TSC2, VHL and XRCC2 (sequencing and deletion/duplication); EGFR, EGLN1, HOXB13, KIT, MITF, PDGFRA, POLD1, and POLE (sequencing only); EPCAM and GREM1 (deletion/duplication only).    06/02/2022 Initial Biopsy   Additional left axillary node biopsy   06/06/2022 -  Chemotherapy   Patient is on Treatment Plan : BREAST Pembrolizumab (200) D1 + Carboplatin (5) D1 + Paclitaxel (80) D1,8,15 q21d X 4 cycles / Pembrolizumab (200) D1 + AC D1 q21d x 4 cycles       CURRENT THERAPY: Taxol/Carbo/Keytruda  INTERVAL HISTORY:  Sydney Lee 61 y.o. female returns for follow-up and evaluation prior to Cycle 2, Day 8 of chemotherapy with Taxol. She is unaccompanied for this visit.  She has noted a dramatic response in the breast She is grieving loss of her mom this past week Otherwise she is tolerating treatment well. No major nausea, vomiting or diarrhea. Taste is very blunted, this bothers her, No tingling or numbness reported today except for a brief episode. Rest of the pertinent 10 point ROS reviewed and negative   Patient Active Problem List   Diagnosis Date Noted   Port-A-Cath in place 06/06/2022   Infusion reaction 06/06/2022   Genetic testing 06/03/2022   Family history of breast cancer 05/25/2022   Family history of ovarian cancer 05/25/2022   Malignant neoplasm of upper-inner quadrant of left breast in female, estrogen receptor negative ([place]) 05/23/2022    is  allergic to [facility] [aprepitant], codeine, and latex.  MEDICAL HISTORY: Past Medical History:  Diagnosis Date   Anxiety    Arthritis    Breast cancer ([place])    Depression    Diabetes mellitus without complication  ([place])    Hypertension     SURGICAL HISTORY: Past Surgical History:  Procedure Laterality Date   PORTACATH PLACEMENT N/A 06/01/2022   Procedure: INSERTION PORT-A-CATH WITH ULTRASOUND GUIDANCE;  Surgeon: Stark Klein, MD;  Location: [facility];  Service: General;  Laterality: N/A;    SOCIAL HISTORY: Social History   Socioeconomic History   Marital status: Married    Spouse name: Not on file   Number of children: Not on file   Years of education: Not on file   Highest education level: Not on file  Occupational History   Not on file  Tobacco Use   Smoking status: Former    Packs/day: 1.00    Years: 40.00    Total pack years: 40.00    Types: Cigarettes    Quit date: 06/05/2022    Years since quitting: 0.0   Smokeless tobacco: Never  Substance and Sexual Activity   Alcohol use: No   Drug use: No   Sexual activity: Not on file  Other Topics Concern   Not on file  Social History Narrative   Not on file   Social Determinants of Health   Financial Resource Strain: High Risk (06/14/2022)   Overall Financial Resource Strain (CARDIA)    Difficulty of Paying Living Expenses: Hard  Food Insecurity: Food Insecurity Present (06/14/2022)   Hunger Vital Sign    Worried About Running Out of Food in the Last Year: Sometimes true    Ran Out of Food in the Last Year: Never true  Transportation Needs: No Transportation Needs (06/14/2022)   PRAPARE - Hydrologist (Medical): No    Lack of Transportation (Non-Medical): No  Physical Activity: Not on file  Stress: Not on file  Social Connections: Not on file  Intimate Partner Violence: Not on file    FAMILY HISTORY: Family History  Problem Relation Age of Onset   Leukemia Mother 5   Throat cancer Maternal Uncle 85   Prostate cancer Maternal Uncle    Breast cancer Cousin 43       maternal first cousin   Ovarian cancer Cousin        maternal first cousin   Breast cancer Cousin 32 - 9       paternal first  cousin   Breast cancer Cousin 60 - 60       paternal first cousin    Review of Systems  Constitutional:  Negative for appetite change, chills, fatigue, fever and unexpected weight change.  HENT:   Negative for hearing loss, lump/mass and trouble swallowing.   Eyes:  Negative for eye problems and icterus.  Respiratory:  Negative for chest tightness, cough and shortness of breath.   Cardiovascular:  Negative for chest pain, leg swelling and palpitations.  Gastrointestinal:  Negative for abdominal distention, abdominal pain, constipation, diarrhea, nausea and vomiting.  Endocrine: Negative for hot flashes.  Genitourinary:  Negative for difficulty urinating.   Musculoskeletal:  Negative for arthralgias.  Skin:  Negative for itching and rash.  Neurological:  Negative for dizziness, extremity weakness, headaches and numbness.  Hematological:  Negative for adenopathy. Does not bruise/bleed easily.  Psychiatric/Behavioral:  Negative for depression. The patient is not nervous/anxious.       PHYSICAL EXAMINATION  ECOG PERFORMANCE STATUS: 1 - Symptomatic but completely ambulatory  Vitals:   07/05/22 0952  BP: 109/72  Pulse: 100  Resp: 16  Temp: 97.8 F (36.6 C)  SpO2: 100%    Physical Exam Constitutional:      General: She is not in acute distress.    Appearance: Normal appearance. She is not toxic-appearing.  HENT:     Head: Normocephalic and atraumatic.  Eyes:     General: No scleral icterus. Cardiovascular:     Rate and Rhythm: Normal rate and regular rhythm.     Pulses: Normal pulses.     Heart sounds: Normal heart sounds.  Pulmonary:     Effort: Pulmonary effort is normal.     Breath sounds: Normal breath sounds.  Chest:     Comments: Left upper inner breast with significant response. Some darkening of the skin in that region. Tumor still palpable. No palpable regional adenopathy Abdominal:     General: Abdomen is flat. Bowel sounds are normal. There is no distension.      Palpations: Abdomen is soft.     Tenderness: There is no abdominal tenderness.  Musculoskeletal:        General: No swelling.     Cervical back: Neck supple.  Lymphadenopathy:     Cervical: No cervical adenopathy.  Skin:    General: Skin is warm and dry.     Findings: No rash.  Neurological:     General: No focal deficit present.     Mental Status: She is alert.  Psychiatric:        Mood and Affect: Mood normal.        Behavior: Behavior normal.     LABORATORY DATA:  CBC    Component Value Date/Time   WBC 3.2 (L) 07/05/2022 0936   WBC 4.8 12/22/2018 2150   RBC 3.87 07/05/2022 0936   HGB 11.6 (L) 07/05/2022 0936   HCT 33.2 (L) 07/05/2022 0936   PLT 190 07/05/2022 0936   MCV 85.8 07/05/2022 0936   MCH 30.0 07/05/2022 0936   MCHC 34.9 07/05/2022 0936   RDW 13.1 07/05/2022 0936   LYMPHSABS 1.6 07/05/2022 0936   MONOABS 0.2 07/05/2022 0936   EOSABS 0.0 07/05/2022 0936   BASOSABS 0.0 07/05/2022 0936    CMP     Component Value Date/Time   NA 143 06/27/2022 1257   K 3.6 06/27/2022 1257   CL 109 06/27/2022 1257   CO2 29 06/27/2022 1257   GLUCOSE 115 (H) 06/27/2022 1257   BUN 6 (L) 06/27/2022 1257   CREATININE 0.67 06/27/2022 1257   CALCIUM 9.2 06/27/2022 1257   PROT 6.5 06/27/2022 1257   ALBUMIN 3.9 06/27/2022 1257   AST 12 (L) 06/27/2022 1257   ALT 11 06/27/2022 1257   ALKPHOS 98 06/27/2022 1257   BILITOT 0.3 06/27/2022 1257   GFRNONAA >60 06/27/2022 1257   GFRAA >60 12/22/2018 2150       ASSESSMENT and THERAPY PLAN:  DASIA GUERRIER is a 61 y.o. 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   #Stage IIb triple negative breast cancer  --Currently receiving neoadjuvant chemotherapy with carbo/taxol and keytruda x 4 cycles followed by adriamycin, cytoxan and keytruda x 4 cycles.  --Due for Cycle 2, Day 8 of taxol today.  --Labs from today were reviewed, CBC with ANC of 1.4, ok to proceed provided rest of the labs are  within parameters --RTC with me in 2 weeks as planned  #  chemotherapy induced diarrhea. Just lasted for a day with 2/3 bowel movements. Ok to use imodium PRN  All questions were answered. The patient knows to call the clinic with any problems, questions or concerns. We can certainly see the patient much sooner if necessary.  I have spent a total of 30 minutes minutes of face-to-face and non-face-to-face time, preparing to see the patient, performing a medically appropriate examination, counseling and educating the patient, documenting clinical information in the electronic health record, and care coordination.   Benay Pike MD

## 2022-07-05 NOTE — Progress Notes (Signed)
Pt ANC is 1.4 today- ok to treat per Dr. Chryl Heck.

## 2022-07-06 LAB — T4: T4, Total: 8.8 ug/dL (ref 4.5–12.0)

## 2022-07-11 MED FILL — Dexamethasone Sodium Phosphate Inj 100 MG/10ML: INTRAMUSCULAR | Qty: 1 | Status: AC

## 2022-07-12 ENCOUNTER — Inpatient Hospital Stay: Payer: BC Managed Care – PPO

## 2022-07-12 ENCOUNTER — Other Ambulatory Visit: Payer: Self-pay

## 2022-07-12 VITALS — BP 107/78 | HR 88 | Temp 98.0°F | Resp 16 | Ht 69.0 in | Wt 212.5 lb

## 2022-07-12 DIAGNOSIS — Z5112 Encounter for antineoplastic immunotherapy: Secondary | ICD-10-CM | POA: Diagnosis not present

## 2022-07-12 DIAGNOSIS — C50212 Malignant neoplasm of upper-inner quadrant of left female breast: Secondary | ICD-10-CM

## 2022-07-12 DIAGNOSIS — Z95828 Presence of other vascular implants and grafts: Secondary | ICD-10-CM

## 2022-07-12 LAB — CBC WITH DIFFERENTIAL (CANCER CENTER ONLY)
Abs Immature Granulocytes: 0 10*3/uL (ref 0.00–0.07)
Basophils Absolute: 0 10*3/uL (ref 0.0–0.1)
Basophils Relative: 0 %
Eosinophils Absolute: 0 10*3/uL (ref 0.0–0.5)
Eosinophils Relative: 0 %
HCT: 28.8 % — ABNORMAL LOW (ref 36.0–46.0)
Hemoglobin: 9.8 g/dL — ABNORMAL LOW (ref 12.0–15.0)
Immature Granulocytes: 0 %
Lymphocytes Relative: 57 %
Lymphs Abs: 1.6 10*3/uL (ref 0.7–4.0)
MCH: 30.1 pg (ref 26.0–34.0)
MCHC: 34 g/dL (ref 30.0–36.0)
MCV: 88.3 fL (ref 80.0–100.0)
Monocytes Absolute: 0.2 10*3/uL (ref 0.1–1.0)
Monocytes Relative: 7 %
Neutro Abs: 1 10*3/uL — ABNORMAL LOW (ref 1.7–7.7)
Neutrophils Relative %: 36 %
Platelet Count: 180 10*3/uL (ref 150–400)
RBC: 3.26 MIL/uL — ABNORMAL LOW (ref 3.87–5.11)
RDW: 13.4 % (ref 11.5–15.5)
Smear Review: NORMAL
WBC Count: 2.9 10*3/uL — ABNORMAL LOW (ref 4.0–10.5)
nRBC: 0 % (ref 0.0–0.2)

## 2022-07-12 LAB — CMP (CANCER CENTER ONLY)
ALT: 13 U/L (ref 0–44)
AST: 12 U/L — ABNORMAL LOW (ref 15–41)
Albumin: 3.9 g/dL (ref 3.5–5.0)
Alkaline Phosphatase: 69 U/L (ref 38–126)
Anion gap: 5 (ref 5–15)
BUN: 7 mg/dL — ABNORMAL LOW (ref 8–23)
CO2: 29 mmol/L (ref 22–32)
Calcium: 8.7 mg/dL — ABNORMAL LOW (ref 8.9–10.3)
Chloride: 107 mmol/L (ref 98–111)
Creatinine: 0.55 mg/dL (ref 0.44–1.00)
GFR, Estimated: >60 mL/min (ref >60)
Glucose, Bld: 122 mg/dL — ABNORMAL HIGH (ref 70–99)
Potassium: 3.8 mmol/L (ref 3.5–5.1)
Sodium: 141 mmol/L (ref 135–145)
Total Bilirubin: 0.2 mg/dL — ABNORMAL LOW (ref 0.3–1.2)
Total Protein: 6.6 g/dL (ref 6.5–8.1)

## 2022-07-12 LAB — TSH: TSH: 1.042 u[IU]/mL (ref 0.350–4.500)

## 2022-07-12 MED ORDER — HEPARIN SOD (PORK) LOCK FLUSH 100 UNIT/ML IV SOLN
500.0000 [IU] | Freq: Once | INTRAVENOUS | Status: AC | PRN
  Administered 2022-07-12: 500 [IU]

## 2022-07-12 MED ORDER — SODIUM CHLORIDE 0.9% FLUSH
10.0000 mL | INTRAVENOUS | Status: DC | PRN
  Administered 2022-07-12: 10 mL

## 2022-07-12 MED ORDER — DIPHENHYDRAMINE HCL 50 MG/ML IJ SOLN
50.0000 mg | Freq: Once | INTRAMUSCULAR | Status: AC
  Administered 2022-07-12: 50 mg via INTRAVENOUS
  Filled 2022-07-12: qty 1

## 2022-07-12 MED ORDER — SODIUM CHLORIDE 0.9 % IV SOLN
80.0000 mg/m2 | Freq: Once | INTRAVENOUS | Status: AC
  Administered 2022-07-12: 174 mg via INTRAVENOUS
  Filled 2022-07-12: qty 29

## 2022-07-12 MED ORDER — SODIUM CHLORIDE 0.9% FLUSH
10.0000 mL | Freq: Once | INTRAVENOUS | Status: AC
  Administered 2022-07-12: 10 mL

## 2022-07-12 MED ORDER — FAMOTIDINE IN NACL 20-0.9 MG/50ML-% IV SOLN
20.0000 mg | Freq: Once | INTRAVENOUS | Status: AC
  Administered 2022-07-12: 20 mg via INTRAVENOUS
  Filled 2022-07-12: qty 50

## 2022-07-12 MED ORDER — SODIUM CHLORIDE 0.9 % IV SOLN: Freq: Once | INTRAVENOUS | Status: AC

## 2022-07-12 MED ORDER — SODIUM CHLORIDE 0.9 % IV SOLN
10.0000 mg | Freq: Once | INTRAVENOUS | Status: AC
  Administered 2022-07-12: 10 mg via INTRAVENOUS
  Filled 2022-07-12: qty 10

## 2022-07-12 NOTE — Patient Instructions (Signed)
[facility] ONCOLOGY  Discharge Instructions: Thank you for choosing [facility] to provide your oncology and hematology care.   If you have a lab appointment with the [facility], please go directly to the [facility] and check in at the registration area.   Wear comfortable clothing and clothing appropriate for easy access to any Portacath or PICC line.   We strive to give you quality time with your provider. You may need to reschedule your appointment if you arrive late (15 or more minutes).  Arriving late affects you and other patients whose appointments are after yours.  Also, if you miss three or more appointments without notifying the office, you may be dismissed from the clinic at the provider's discretion.      For prescription refill requests, have your pharmacy contact our office and allow 72 hours for refills to be completed.    Today you received the following chemotherapy and/or immunotherapy agent: Paclitaxel (Taxol)   To help prevent nausea and vomiting after your treatment, we encourage you to take your nausea medication as directed.  BELOW ARE SYMPTOMS THAT SHOULD BE REPORTED IMMEDIATELY: *FEVER GREATER THAN 100.4 F (38 C) OR HIGHER *CHILLS OR SWEATING *NAUSEA AND VOMITING THAT IS NOT CONTROLLED WITH YOUR NAUSEA MEDICATION *UNUSUAL SHORTNESS OF BREATH *UNUSUAL BRUISING OR BLEEDING *URINARY PROBLEMS (pain or burning when urinating, or frequent urination) *BOWEL PROBLEMS (unusual diarrhea, constipation, pain near the anus) TENDERNESS IN MOUTH AND THROAT WITH OR WITHOUT PRESENCE OF ULCERS (sore throat, sores in mouth, or a toothache) UNUSUAL RASH, SWELLING OR PAIN  UNUSUAL VAGINAL DISCHARGE OR ITCHING   Items with * indicate a potential emergency and should be followed up as soon as possible or go to the Emergency Department if any problems should occur.  Please show the CHEMOTHERAPY ALERT CARD or IMMUNOTHERAPY ALERT CARD at  check-in to the Emergency Department and triage nurse.  Should you have questions after your visit or need to cancel or reschedule your appointment, please contact [facility]  Dept: [phone]  and follow the prompts.  Office hours are 8:00 a.m. to 4:30 p.m. Monday - Friday. Please note that voicemails left after 4:00 p.m. may not be returned until the following business day.  We are closed weekends and major holidays. You have access to a nurse at all times for urgent questions. Please call the main number to the clinic Dept: [phone] and follow the prompts.   For any non-urgent questions, you may also contact your provider using MyChart. We now offer e-Visits for anyone 73 and older to request care online for non-urgent symptoms. For details visit [email or web address].   Also download the MyChart app! Go to the app store, search "MyChart", open the app, select [facility], and log in with your MyChart username and password.  Masks are optional in the cancer centers. If you would like for your care team to wear a mask while they are taking care of you, please let them know. You may have one support person who is at least 62 years old accompany you for your appointments.

## 2022-07-12 NOTE — Progress Notes (Signed)
Per Dr. Chryl Heck, ok for treatment today with ANC 1.0 K/uL

## 2022-07-12 NOTE — Progress Notes (Signed)
Ok to treat w/ ANC 1.0 per MD [place].  Larene Beach, PharmD

## 2022-07-13 ENCOUNTER — Inpatient Hospital Stay: Payer: BC Managed Care – PPO

## 2022-07-13 ENCOUNTER — Inpatient Hospital Stay: Payer: BC Managed Care – PPO | Admitting: Genetic Counselor

## 2022-07-13 VITALS — BP 122/82 | HR 88 | Resp 16

## 2022-07-13 DIAGNOSIS — Z171 Estrogen receptor negative status [ER-]: Secondary | ICD-10-CM

## 2022-07-13 DIAGNOSIS — Z5112 Encounter for antineoplastic immunotherapy: Secondary | ICD-10-CM | POA: Diagnosis not present

## 2022-07-13 LAB — T4: T4, Total: 7.6 ug/dL (ref 4.5–12.0)

## 2022-07-13 MED ORDER — FILGRASTIM-AAFI 480 MCG/0.8ML IJ SOSY
480.0000 ug | PREFILLED_SYRINGE | Freq: Once | INTRAMUSCULAR | Status: AC
  Administered 2022-07-13: 480 ug via SUBCUTANEOUS
  Filled 2022-07-13: qty 0.8

## 2022-07-14 ENCOUNTER — Inpatient Hospital Stay: Payer: BC Managed Care – PPO

## 2022-07-14 ENCOUNTER — Other Ambulatory Visit: Payer: Self-pay

## 2022-07-14 VITALS — BP 101/66 | HR 92 | Temp 98.8°F | Resp 18

## 2022-07-14 DIAGNOSIS — Z5112 Encounter for antineoplastic immunotherapy: Secondary | ICD-10-CM | POA: Diagnosis not present

## 2022-07-14 DIAGNOSIS — Z171 Estrogen receptor negative status [ER-]: Secondary | ICD-10-CM

## 2022-07-14 MED ORDER — FILGRASTIM-AAFI 480 MCG/0.8ML IJ SOSY
480.0000 ug | PREFILLED_SYRINGE | Freq: Once | INTRAMUSCULAR | Status: AC
  Administered 2022-07-14: 480 ug via SUBCUTANEOUS
  Filled 2022-07-14: qty 0.8

## 2022-07-15 ENCOUNTER — Inpatient Hospital Stay: Payer: BC Managed Care – PPO

## 2022-07-15 VITALS — BP 100/64 | HR 98 | Temp 99.1°F | Resp 20

## 2022-07-15 DIAGNOSIS — Z5112 Encounter for antineoplastic immunotherapy: Secondary | ICD-10-CM | POA: Diagnosis not present

## 2022-07-15 DIAGNOSIS — Z171 Estrogen receptor negative status [ER-]: Secondary | ICD-10-CM

## 2022-07-15 MED ORDER — FILGRASTIM-AAFI 480 MCG/0.8ML IJ SOSY
480.0000 ug | PREFILLED_SYRINGE | Freq: Once | INTRAMUSCULAR | Status: AC
  Administered 2022-07-15: 480 ug via SUBCUTANEOUS
  Filled 2022-07-15: qty 0.8

## 2022-07-19 ENCOUNTER — Other Ambulatory Visit: Payer: Self-pay

## 2022-07-19 ENCOUNTER — Inpatient Hospital Stay (HOSPITAL_BASED_OUTPATIENT_CLINIC_OR_DEPARTMENT_OTHER): Payer: BC Managed Care – PPO | Admitting: Hematology and Oncology

## 2022-07-19 ENCOUNTER — Encounter: Payer: Self-pay | Admitting: Hematology and Oncology

## 2022-07-19 ENCOUNTER — Inpatient Hospital Stay: Payer: BC Managed Care – PPO

## 2022-07-19 VITALS — BP 119/72 | HR 87 | Temp 97.7°F | Resp 18 | Ht 69.0 in | Wt 212.2 lb

## 2022-07-19 DIAGNOSIS — D6959 Other secondary thrombocytopenia: Secondary | ICD-10-CM

## 2022-07-19 DIAGNOSIS — D701 Agranulocytosis secondary to cancer chemotherapy: Secondary | ICD-10-CM | POA: Diagnosis not present

## 2022-07-19 DIAGNOSIS — C50212 Malignant neoplasm of upper-inner quadrant of left female breast: Secondary | ICD-10-CM

## 2022-07-19 DIAGNOSIS — Z171 Estrogen receptor negative status [ER-]: Secondary | ICD-10-CM | POA: Diagnosis not present

## 2022-07-19 DIAGNOSIS — Z95828 Presence of other vascular implants and grafts: Secondary | ICD-10-CM

## 2022-07-19 DIAGNOSIS — T451X5A Adverse effect of antineoplastic and immunosuppressive drugs, initial encounter: Secondary | ICD-10-CM

## 2022-07-19 DIAGNOSIS — Z5112 Encounter for antineoplastic immunotherapy: Secondary | ICD-10-CM | POA: Diagnosis not present

## 2022-07-19 LAB — CBC WITH DIFFERENTIAL (CANCER CENTER ONLY)
Abs Immature Granulocytes: 0.01 10*3/uL (ref 0.00–0.07)
Basophils Absolute: 0 10*3/uL (ref 0.0–0.1)
Basophils Relative: 0 %
Eosinophils Absolute: 0 10*3/uL (ref 0.0–0.5)
Eosinophils Relative: 0 %
HCT: 29.1 % — ABNORMAL LOW (ref 36.0–46.0)
Hemoglobin: 9.9 g/dL — ABNORMAL LOW (ref 12.0–15.0)
Immature Granulocytes: 0 %
Lymphocytes Relative: 62 %
Lymphs Abs: 1.5 10*3/uL (ref 0.7–4.0)
MCH: 30.3 pg (ref 26.0–34.0)
MCHC: 34 g/dL (ref 30.0–36.0)
MCV: 89 fL (ref 80.0–100.0)
Monocytes Absolute: 0.2 10*3/uL (ref 0.1–1.0)
Monocytes Relative: 9 %
Neutro Abs: 0.7 10*3/uL — ABNORMAL LOW (ref 1.7–7.7)
Neutrophils Relative %: 29 %
Platelet Count: 68 10*3/uL — ABNORMAL LOW (ref 150–400)
RBC: 3.27 MIL/uL — ABNORMAL LOW (ref 3.87–5.11)
RDW: 14.4 % (ref 11.5–15.5)
WBC Count: 2.5 10*3/uL — ABNORMAL LOW (ref 4.0–10.5)
nRBC: 0 % (ref 0.0–0.2)

## 2022-07-19 LAB — CMP (CANCER CENTER ONLY)
ALT: 15 U/L (ref 0–44)
AST: 14 U/L — ABNORMAL LOW (ref 15–41)
Albumin: 3.7 g/dL (ref 3.5–5.0)
Alkaline Phosphatase: 72 U/L (ref 38–126)
Anion gap: 6 (ref 5–15)
BUN: 10 mg/dL (ref 8–23)
CO2: 25 mmol/L (ref 22–32)
Calcium: 8.9 mg/dL (ref 8.9–10.3)
Chloride: 108 mmol/L (ref 98–111)
Creatinine: 0.7 mg/dL (ref 0.44–1.00)
GFR, Estimated: >60 mL/min (ref >60)
Glucose, Bld: 145 mg/dL — ABNORMAL HIGH (ref 70–99)
Potassium: 3.9 mmol/L (ref 3.5–5.1)
Sodium: 139 mmol/L (ref 135–145)
Total Bilirubin: 0.2 mg/dL — ABNORMAL LOW (ref 0.3–1.2)
Total Protein: 6.8 g/dL (ref 6.5–8.1)

## 2022-07-19 LAB — TSH: TSH: 1.456 u[IU]/mL (ref 0.350–4.500)

## 2022-07-19 MED ORDER — SODIUM CHLORIDE 0.9% FLUSH
10.0000 mL | Freq: Once | INTRAVENOUS | Status: AC
  Administered 2022-07-19: 10 mL

## 2022-07-19 NOTE — Progress Notes (Signed)
[facility] Cancer Follow up:    Sydney Lee, Gerald, MD [address] [place] [address]   DIAGNOSIS:  Cancer Staging  Malignant neoplasm of upper-inner quadrant of left breast in female, estrogen receptor negative (HCC) Staging form: Breast, AJCC 8th Edition - Clinical: Stage IIB (cT2, cN0, cM0, G3, ER-, PR-, HER2-) - Signed by Iruku, Praveena, MD on 05/25/2022 Stage prefix: Initial diagnosis Histologic grading system: 3 grade system   SUMMARY OF ONCOLOGIC HISTORY: Oncology History  Malignant neoplasm of upper-inner quadrant of left breast in female, estrogen receptor negative (HCC)  04/27/2022 Mammogram   Diagnostic mammogram showed indeterminate solid mass in the 10:00 location of the left breast.  Small satellite nodule adjacent to the index mass.  Borderline lymph node has cortical thickening of 2.9 mm.  Other lymph nodes have normal morphology.   05/16/2022 Pathology Results   Left breast needle core biopsy showed invasive poorly differentiated adenocarcinoma, grade 3 with tumor necrosis, lymph node biopsy benign reactive, negative for carcinoma.  Prognostics from the tumor showed ER 0%, negative, PR 0%, negative, Ki-67 of 80% and HER2 negative   05/23/2022 Initial Diagnosis   Malignant neoplasm of upper-inner quadrant of left breast in female, estrogen receptor negative (HCC)   05/25/2022 Cancer Staging   Staging form: Breast, AJCC 8th Edition - Clinical: Stage IIB (cT2, cN0, cM0, G3, ER-, PR-, HER2-) - Signed by Iruku, Praveena, MD on 05/25/2022 Stage prefix: Initial diagnosis Histologic grading system: 3 grade system   05/25/2022 Genetic Testing   [facility] CancerNext-Expanded Panel was Negative. Report date was 06/02/2022.  The CancerNext-Expanded gene panel offered by [facility] and includes sequencing, rearrangement, and RNA analysis for the following 77 genes: AIP, ALK, APC, ATM, AXIN2, BAP1, BARD1, BLM, BMPR1A, BRCA1, BRCA2, BRIP1, CDC73, CDH1, CDK4, CDKN1B,  CDKN2A, CHEK2, CTNNA1, DICER1, FANCC, FH, FLCN, GALNT12, KIF1B, LZTR1, MAX, MEN1, MET, MLH1, MSH2, MSH3, MSH6, MUTYH, NBN, NF1, NF2, NTHL1, PALB2, PHOX2B, PMS2, POT1, PRKAR1A, PTCH1, PTEN, RAD51C, RAD51D, RB1, RECQL, RET, SDHA, SDHAF2, SDHB, SDHC, SDHD, SMAD4, SMARCA4, SMARCB1, SMARCE1, STK11, SUFU, TMEM127, TP53, TSC1, TSC2, VHL and XRCC2 (sequencing and deletion/duplication); EGFR, EGLN1, HOXB13, KIT, MITF, PDGFRA, POLD1, and POLE (sequencing only); EPCAM and GREM1 (deletion/duplication only).    06/02/2022 Initial Biopsy   Additional left axillary node biopsy   06/06/2022 -  Chemotherapy   Patient is on Treatment Plan : BREAST Pembrolizumab (200) D1 + Carboplatin (5) D1 + Paclitaxel (80) D1,8,15 q21d X 4 cycles / Pembrolizumab (200) D1 + AC D1 q21d x 4 cycles       CURRENT THERAPY: Taxol/Carbo/Keytruda  INTERVAL HISTORY:  Sydney Lee 61 y.o. female returns for follow-up and evaluation prior to Cycle 3D1 of chemotherapy. She is unaccompanied for this visit.  She is doing quite well today.  She denies any complaints at all.  She feels a bit fatigued and very stressed because her husband had to be admitted last week to the hospital and is undergoing right above-knee amputation.  She also lost her mom 2 to 3 weeks ago and did not even have a chance to grieve.  She otherwise denies any nausea or vomiting or diarrhea.  No neuropathy reported.  She has noticed a mild acneform rash on her hands.  No bleeding reported Rest of the pertinent 10 point ROS reviewed and negative   Patient Active Problem List   Diagnosis Date Noted   Port-A-Cath in place 06/06/2022   Infusion reaction 06/06/2022   Genetic testing 06/03/2022     Family history of breast cancer 05/25/2022   Family history of ovarian cancer 05/25/2022   Malignant neoplasm of upper-inner quadrant of left breast in female, estrogen receptor negative (HCC) 05/23/2022    is allergic to emend [aprepitant], codeine, and latex.  MEDICAL  HISTORY: Past Medical History:  Diagnosis Date   Anxiety    Arthritis    Breast cancer (HCC)    Depression    Diabetes mellitus without complication (HCC)    Hypertension     SURGICAL HISTORY: Past Surgical History:  Procedure Laterality Date   PORTACATH PLACEMENT N/A 06/01/2022   Procedure: INSERTION PORT-A-CATH WITH ULTRASOUND GUIDANCE;  Surgeon: Byerly, Faera, MD;  Location: [facility];  Service: General;  Laterality: N/A;    SOCIAL HISTORY: Social History   Socioeconomic History   Marital status: Married    Spouse name: Not on file   Number of children: Not on file   Years of education: Not on file   Highest education level: Not on file  Occupational History   Not on file  Tobacco Use   Smoking status: Former    Packs/day: 1.00    Years: 40.00    Total pack years: 40.00    Types: Cigarettes    Quit date: 06/05/2022    Years since quitting: 0.1   Smokeless tobacco: Never  Substance and Sexual Activity   Alcohol use: No   Drug use: No   Sexual activity: Not on file  Other Topics Concern   Not on file  Social History Narrative   Not on file   Social Determinants of Health   Financial Resource Strain: High Risk (06/14/2022)   Overall Financial Resource Strain (CARDIA)    Difficulty of Paying Living Expenses: Hard  Food Insecurity: Food Insecurity Present (06/14/2022)   Hunger Vital Sign    Worried About Running Out of Food in the Last Year: Sometimes true    Ran Out of Food in the Last Year: Never true  Transportation Needs: No Transportation Needs (06/14/2022)   PRAPARE - Transportation    Lack of Transportation (Medical): No    Lack of Transportation (Non-Medical): No  Physical Activity: Not on file  Stress: Not on file  Social Connections: Not on file  Intimate Partner Violence: Not on file    FAMILY HISTORY: Family History  Problem Relation Age of Onset   Leukemia Mother 77   Throat cancer Maternal Uncle 85   Prostate cancer Maternal Uncle    Breast  cancer Cousin 42       maternal first cousin   Ovarian cancer Cousin        maternal first cousin   Breast cancer Cousin 40 - 49       paternal first cousin   Breast cancer Cousin 50 - 59       paternal first cousin    Review of Systems  Constitutional:  Negative for appetite change, chills, fatigue, fever and unexpected weight change.  HENT:   Negative for hearing loss, lump/mass and trouble swallowing.   Eyes:  Negative for eye problems and icterus.  Respiratory:  Negative for chest tightness, cough and shortness of breath.   Cardiovascular:  Negative for chest pain, leg swelling and palpitations.  Gastrointestinal:  Negative for abdominal distention, abdominal pain, constipation, diarrhea, nausea and vomiting.  Endocrine: Negative for hot flashes.  Genitourinary:  Negative for difficulty urinating.   Musculoskeletal:  Negative for arthralgias.  Skin:  Negative for itching and rash.  Neurological:  Negative for dizziness,   extremity weakness, headaches and numbness.  Hematological:  Negative for adenopathy. Does not bruise/bleed easily.  Psychiatric/Behavioral:  Negative for depression. The patient is not nervous/anxious.       PHYSICAL EXAMINATION  ECOG PERFORMANCE STATUS: 1 - Symptomatic but completely ambulatory  Vitals:   07/19/22 0900  BP: 119/72  Pulse: 87  Resp: 18  Temp: 97.7 F (36.5 C)  SpO2: 100%    Physical Exam Constitutional:      General: She is not in acute distress.    Appearance: Normal appearance. She is not toxic-appearing.  HENT:     Head: Normocephalic and atraumatic.  Eyes:     General: No scleral icterus. Cardiovascular:     Rate and Rhythm: Normal rate and regular rhythm.     Pulses: Normal pulses.     Heart sounds: Normal heart sounds.  Pulmonary:     Effort: Pulmonary effort is normal.     Breath sounds: Normal breath sounds.  Chest:     Comments: Left upper inner breast with no definitive palpable mass.  There is some abnormal  density in the area likely residual scar.  No regional adenopathy. Abdominal:     General: Abdomen is flat. Bowel sounds are normal. There is no distension.     Palpations: Abdomen is soft.     Tenderness: There is no abdominal tenderness.  Musculoskeletal:        General: No swelling.     Cervical back: Neck supple.  Lymphadenopathy:     Cervical: No cervical adenopathy.  Skin:    General: Skin is warm and dry.     Findings: No rash.  Neurological:     General: No focal deficit present.     Mental Status: She is alert.  Psychiatric:        Mood and Affect: Mood normal.        Behavior: Behavior normal.     LABORATORY DATA:  CBC    Component Value Date/Time   WBC 2.5 (L) 07/19/2022 0834   WBC 4.8 12/22/2018 2150   RBC 3.27 (L) 07/19/2022 0834   HGB 9.9 (L) 07/19/2022 0834   HCT 29.1 (L) 07/19/2022 0834   PLT 68 (L) 07/19/2022 0834   MCV 89.0 07/19/2022 0834   MCH 30.3 07/19/2022 0834   MCHC 34.0 07/19/2022 0834   RDW 14.4 07/19/2022 0834   LYMPHSABS 1.5 07/19/2022 0834   MONOABS 0.2 07/19/2022 0834   EOSABS 0.0 07/19/2022 0834   BASOSABS 0.0 07/19/2022 0834    CMP     Component Value Date/Time   NA 141 07/12/2022 0857   K 3.8 07/12/2022 0857   CL 107 07/12/2022 0857   CO2 29 07/12/2022 0857   GLUCOSE 122 (H) 07/12/2022 0857   BUN 7 (L) 07/12/2022 0857   CREATININE 0.55 07/12/2022 0857   CALCIUM 8.7 (L) 07/12/2022 0857   PROT 6.6 07/12/2022 0857   ALBUMIN 3.9 07/12/2022 0857   AST 12 (L) 07/12/2022 0857   ALT 13 07/12/2022 0857   ALKPHOS 69 07/12/2022 0857   BILITOT 0.2 (L) 07/12/2022 0857   GFRNONAA >60 07/12/2022 0857   GFRAA >60 12/22/2018 2150       ASSESSMENT and THERAPY PLAN:  Sydney Lee is a 61 y.o. 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   #Stage IIb triple negative breast cancer  --Currently receiving neoadjuvant chemotherapy with carbo/taxol and keytruda x 4 cycles followed by adriamycin,  cytoxan and keytruda x 4  cycles.  --Due for Cycle 3-day 1 today. --Labs from today were reviewed, she appears to have significant neutropenia and thrombocytopenia.  We will plan to delay chemotherapy by a week. -She had tremendous response so far, no palpable mass in the left breast, suggest some abnormal density in the area of tumor.  #Chemotherapy-induced neutropenia, neutropenic precautions advised.  #Chemo induced thrombocytopenia, no evidence of bleeding.  We will delay treatment and adjust the dose of carboplatin.   All questions were answered. The patient knows to call the clinic with any problems, questions or concerns. We can certainly see the patient much sooner if necessary.  I have spent a total of 30 minutes minutes of face-to-face and non-face-to-face time, preparing to see the patient, performing a medically appropriate examination, counseling and educating the patient, documenting clinical information in the electronic health record, and care coordination.   Praveena Iruku MD    

## 2022-07-20 LAB — T4: T4, Total: 7.1 ug/dL (ref 4.5–12.0)

## 2022-07-21 ENCOUNTER — Other Ambulatory Visit: Payer: Self-pay

## 2022-07-24 ENCOUNTER — Other Ambulatory Visit: Payer: Self-pay

## 2022-07-25 ENCOUNTER — Other Ambulatory Visit: Payer: Self-pay

## 2022-07-25 ENCOUNTER — Inpatient Hospital Stay: Payer: BC Managed Care – PPO

## 2022-07-25 ENCOUNTER — Other Ambulatory Visit: Payer: Self-pay | Admitting: Hematology and Oncology

## 2022-07-25 VITALS — BP 112/71 | HR 87 | Temp 98.1°F | Resp 18 | Wt 210.0 lb

## 2022-07-25 DIAGNOSIS — Z95828 Presence of other vascular implants and grafts: Secondary | ICD-10-CM

## 2022-07-25 DIAGNOSIS — Z5112 Encounter for antineoplastic immunotherapy: Secondary | ICD-10-CM | POA: Diagnosis not present

## 2022-07-25 DIAGNOSIS — C50212 Malignant neoplasm of upper-inner quadrant of left female breast: Secondary | ICD-10-CM

## 2022-07-25 LAB — CBC WITH DIFFERENTIAL (CANCER CENTER ONLY)
Abs Immature Granulocytes: 0.07 10*3/uL (ref 0.00–0.07)
Basophils Absolute: 0 10*3/uL (ref 0.0–0.1)
Basophils Relative: 1 %
Eosinophils Absolute: 0 10*3/uL (ref 0.0–0.5)
Eosinophils Relative: 1 %
HCT: 30.4 % — ABNORMAL LOW (ref 36.0–46.0)
Hemoglobin: 10.2 g/dL — ABNORMAL LOW (ref 12.0–15.0)
Immature Granulocytes: 2 %
Lymphocytes Relative: 42 %
Lymphs Abs: 1.8 10*3/uL (ref 0.7–4.0)
MCH: 30.3 pg (ref 26.0–34.0)
MCHC: 33.6 g/dL (ref 30.0–36.0)
MCV: 90.2 fL (ref 80.0–100.0)
Monocytes Absolute: 0.5 10*3/uL (ref 0.1–1.0)
Monocytes Relative: 11 %
Neutro Abs: 1.8 10*3/uL (ref 1.7–7.7)
Neutrophils Relative %: 43 %
Platelet Count: 441 10*3/uL — ABNORMAL HIGH (ref 150–400)
RBC: 3.37 MIL/uL — ABNORMAL LOW (ref 3.87–5.11)
RDW: 15.9 % — ABNORMAL HIGH (ref 11.5–15.5)
WBC Count: 4.2 10*3/uL (ref 4.0–10.5)
nRBC: 0 % (ref 0.0–0.2)

## 2022-07-25 LAB — CMP (CANCER CENTER ONLY)
ALT: 9 U/L (ref 0–44)
AST: 11 U/L — ABNORMAL LOW (ref 15–41)
Albumin: 4 g/dL (ref 3.5–5.0)
Alkaline Phosphatase: 74 U/L (ref 38–126)
Anion gap: 5 (ref 5–15)
BUN: 9 mg/dL (ref 8–23)
CO2: 28 mmol/L (ref 22–32)
Calcium: 9.4 mg/dL (ref 8.9–10.3)
Chloride: 106 mmol/L (ref 98–111)
Creatinine: 0.68 mg/dL (ref 0.44–1.00)
GFR, Estimated: >60 mL/min (ref >60)
Glucose, Bld: 117 mg/dL — ABNORMAL HIGH (ref 70–99)
Potassium: 3.9 mmol/L (ref 3.5–5.1)
Sodium: 139 mmol/L (ref 135–145)
Total Bilirubin: 0.2 mg/dL — ABNORMAL LOW (ref 0.3–1.2)
Total Protein: 6.6 g/dL (ref 6.5–8.1)

## 2022-07-25 LAB — TSH: TSH: 1.716 u[IU]/mL (ref 0.350–4.500)

## 2022-07-25 MED ORDER — SODIUM CHLORIDE 0.9 % IV SOLN
80.0000 mg/m2 | Freq: Once | INTRAVENOUS | Status: AC
  Administered 2022-07-25: 174 mg via INTRAVENOUS
  Filled 2022-07-25: qty 29

## 2022-07-25 MED ORDER — HEPARIN SOD (PORK) LOCK FLUSH 100 UNIT/ML IV SOLN
500.0000 [IU] | Freq: Once | INTRAVENOUS | Status: AC | PRN
  Administered 2022-07-25: 500 [IU]

## 2022-07-25 MED ORDER — FAMOTIDINE IN NACL 20-0.9 MG/50ML-% IV SOLN
20.0000 mg | Freq: Once | INTRAVENOUS | Status: AC
  Administered 2022-07-25: 20 mg via INTRAVENOUS
  Filled 2022-07-25: qty 50

## 2022-07-25 MED ORDER — SODIUM CHLORIDE 0.9% FLUSH
10.0000 mL | INTRAVENOUS | Status: DC | PRN
  Administered 2022-07-25: 10 mL

## 2022-07-25 MED ORDER — HEPARIN SOD (PORK) LOCK FLUSH 100 UNIT/ML IV SOLN: 500.0000 [IU] | Freq: Once | INTRAVENOUS | Status: DC

## 2022-07-25 MED ORDER — SODIUM CHLORIDE 0.9 % IV SOLN: Freq: Once | INTRAVENOUS | Status: AC

## 2022-07-25 MED ORDER — DIPHENHYDRAMINE HCL 50 MG/ML IJ SOLN
50.0000 mg | Freq: Once | INTRAMUSCULAR | Status: AC
  Administered 2022-07-25: 50 mg via INTRAVENOUS
  Filled 2022-07-25: qty 1

## 2022-07-25 MED ORDER — SODIUM CHLORIDE 0.9 % IV SOLN
10.0000 mg | Freq: Once | INTRAVENOUS | Status: AC
  Administered 2022-07-25: 10 mg via INTRAVENOUS
  Filled 2022-07-25: qty 10

## 2022-07-25 MED ORDER — SODIUM CHLORIDE 0.9 % IV SOLN
702.0000 mg | Freq: Once | INTRAVENOUS | Status: AC
  Administered 2022-07-25: 700 mg via INTRAVENOUS
  Filled 2022-07-25: qty 70

## 2022-07-25 MED ORDER — SODIUM CHLORIDE 0.9% FLUSH
10.0000 mL | Freq: Once | INTRAVENOUS | Status: AC
  Administered 2022-07-25: 10 mL

## 2022-07-25 MED ORDER — SODIUM CHLORIDE 0.9 % IV SOLN
200.0000 mg | Freq: Once | INTRAVENOUS | Status: AC
  Administered 2022-07-25: 200 mg via INTRAVENOUS
  Filled 2022-07-25: qty 8

## 2022-07-25 MED ORDER — PALONOSETRON HCL INJECTION 0.25 MG/5ML
0.2500 mg | Freq: Once | INTRAVENOUS | Status: AC
  Administered 2022-07-25: 0.25 mg via INTRAVENOUS
  Filled 2022-07-25: qty 5

## 2022-07-25 NOTE — Progress Notes (Signed)
Per MD Iruku continue current dosing of carboplatin.  Larene Beach, PharmD

## 2022-07-25 NOTE — Patient Instructions (Signed)
[facility] MEDICAL ONCOLOGY  Discharge Instructions: Thank you for choosing [facility] to provide your oncology and hematology care.   If you have a lab appointment with the Cancer Center, please go directly to the Cancer Center and check in at the registration area.   Wear comfortable clothing and clothing appropriate for easy access to any Portacath or PICC line.   We strive to give you quality time with your provider. You may need to reschedule your appointment if you arrive late (15 or more minutes).  Arriving late affects you and other patients whose appointments are after yours.  Also, if you miss three or more appointments without notifying the office, you may be dismissed from the clinic at the provider's discretion.      For prescription refill requests, have your pharmacy contact our office and allow 72 hours for refills to be completed.    Today you received the following chemotherapy and/or immunotherapy agents Keytruda, Taxol, Carboplatin      To help prevent nausea and vomiting after your treatment, we encourage you to take your nausea medication as directed.  BELOW ARE SYMPTOMS THAT SHOULD BE REPORTED IMMEDIATELY: *FEVER GREATER THAN 100.4 F (38 C) OR HIGHER *CHILLS OR SWEATING *NAUSEA AND VOMITING THAT IS NOT CONTROLLED WITH YOUR NAUSEA MEDICATION *UNUSUAL SHORTNESS OF BREATH *UNUSUAL BRUISING OR BLEEDING *URINARY PROBLEMS (pain or burning when urinating, or frequent urination) *BOWEL PROBLEMS (unusual diarrhea, constipation, pain near the anus) TENDERNESS IN MOUTH AND THROAT WITH OR WITHOUT PRESENCE OF ULCERS (sore throat, sores in mouth, or a toothache) UNUSUAL RASH, SWELLING OR PAIN  UNUSUAL VAGINAL DISCHARGE OR ITCHING   Items with * indicate a potential emergency and should be followed up as soon as possible or go to the Emergency Department if any problems should occur.  Please show the CHEMOTHERAPY ALERT CARD or IMMUNOTHERAPY ALERT  CARD at check-in to the Emergency Department and triage nurse.  Should you have questions after your visit or need to cancel or reschedule your appointment, please contact [facility] MEDICAL ONCOLOG[facility]  Dept: [phone]  and follow the prompts.  Office hours are 8:00 a.m. to 4:30 p.m. Monday - Friday. Please note that voicemails left after 4:00 p.m. may not be returned until the following business day.  We are closed weekends and major holidays. You have access to a nurse at all times for urgent questions. Please call the main number to the clinic Dept: [phone] and follow the prompts.   For any non-urgent questions, you may also contact your provider using MyChart. We now offer e-Visits for anyone 18 and older to request care online for non-urgent symptoms. For details visit [email or web address].   Also download the MyChart app! Go to the app store, search "MyChart", open the app, select , and log in with your MyChart username and password.  Masks are optional in the cancer centers. If you would like for your care team to wear a mask while they are taking care of you, please let them know. For doctor visits, patients may have with them one support person who is at least 62 years old. At this time, visitors are not allowed in the infusion area. 

## 2022-07-26 ENCOUNTER — Encounter: Payer: Self-pay | Admitting: *Deleted

## 2022-07-26 ENCOUNTER — Inpatient Hospital Stay: Payer: BC Managed Care – PPO

## 2022-07-26 LAB — T4: T4, Total: 6.9 ug/dL (ref 4.5–12.0)

## 2022-08-01 MED FILL — Dexamethasone Sodium Phosphate Inj 100 MG/10ML: INTRAMUSCULAR | Qty: 1 | Status: AC

## 2022-08-02 ENCOUNTER — Inpatient Hospital Stay (HOSPITAL_BASED_OUTPATIENT_CLINIC_OR_DEPARTMENT_OTHER): Payer: BC Managed Care – PPO | Admitting: Physician Assistant

## 2022-08-02 ENCOUNTER — Other Ambulatory Visit: Payer: Self-pay | Admitting: Hematology and Oncology

## 2022-08-02 ENCOUNTER — Inpatient Hospital Stay: Payer: BC Managed Care – PPO

## 2022-08-02 ENCOUNTER — Other Ambulatory Visit: Payer: Self-pay

## 2022-08-02 VITALS — BP 108/74 | HR 98 | Temp 98.5°F | Resp 17

## 2022-08-02 VITALS — BP 120/76 | HR 94 | Temp 97.3°F | Resp 17 | Wt 211.1 lb

## 2022-08-02 DIAGNOSIS — T451X5A Adverse effect of antineoplastic and immunosuppressive drugs, initial encounter: Secondary | ICD-10-CM | POA: Diagnosis not present

## 2022-08-02 DIAGNOSIS — Z95828 Presence of other vascular implants and grafts: Secondary | ICD-10-CM

## 2022-08-02 DIAGNOSIS — Z171 Estrogen receptor negative status [ER-]: Secondary | ICD-10-CM

## 2022-08-02 DIAGNOSIS — K521 Toxic gastroenteritis and colitis: Secondary | ICD-10-CM | POA: Diagnosis not present

## 2022-08-02 DIAGNOSIS — C50212 Malignant neoplasm of upper-inner quadrant of left female breast: Secondary | ICD-10-CM | POA: Diagnosis not present

## 2022-08-02 DIAGNOSIS — Z5112 Encounter for antineoplastic immunotherapy: Secondary | ICD-10-CM | POA: Diagnosis not present

## 2022-08-02 LAB — CMP (CANCER CENTER ONLY)
ALT: 9 U/L (ref 0–44)
AST: 10 U/L — ABNORMAL LOW (ref 15–41)
Albumin: 3.8 g/dL (ref 3.5–5.0)
Alkaline Phosphatase: 68 U/L (ref 38–126)
Anion gap: 4 — ABNORMAL LOW (ref 5–15)
BUN: 10 mg/dL (ref 8–23)
CO2: 27 mmol/L (ref 22–32)
Calcium: 9 mg/dL (ref 8.9–10.3)
Chloride: 106 mmol/L (ref 98–111)
Creatinine: 0.47 mg/dL (ref 0.44–1.00)
GFR, Estimated: >60 mL/min (ref >60)
Glucose, Bld: 149 mg/dL — ABNORMAL HIGH (ref 70–99)
Potassium: 3.4 mmol/L — ABNORMAL LOW (ref 3.5–5.1)
Sodium: 137 mmol/L (ref 135–145)
Total Bilirubin: 0.3 mg/dL (ref 0.3–1.2)
Total Protein: 6.3 g/dL — ABNORMAL LOW (ref 6.5–8.1)

## 2022-08-02 LAB — CBC WITH DIFFERENTIAL (CANCER CENTER ONLY)
Abs Immature Granulocytes: 0.01 10*3/uL (ref 0.00–0.07)
Basophils Absolute: 0 10*3/uL (ref 0.0–0.1)
Basophils Relative: 0 %
Eosinophils Absolute: 0 10*3/uL (ref 0.0–0.5)
Eosinophils Relative: 0 %
HCT: 28.6 % — ABNORMAL LOW (ref 36.0–46.0)
Hemoglobin: 10 g/dL — ABNORMAL LOW (ref 12.0–15.0)
Immature Granulocytes: 0 %
Lymphocytes Relative: 47 %
Lymphs Abs: 2.4 10*3/uL (ref 0.7–4.0)
MCH: 31.2 pg (ref 26.0–34.0)
MCHC: 35 g/dL (ref 30.0–36.0)
MCV: 89.1 fL (ref 80.0–100.0)
Monocytes Absolute: 0.4 10*3/uL (ref 0.1–1.0)
Monocytes Relative: 9 %
Neutro Abs: 2.2 10*3/uL (ref 1.7–7.7)
Neutrophils Relative %: 44 %
Platelet Count: 461 10*3/uL — ABNORMAL HIGH (ref 150–400)
RBC: 3.21 MIL/uL — ABNORMAL LOW (ref 3.87–5.11)
RDW: 15.9 % — ABNORMAL HIGH (ref 11.5–15.5)
WBC Count: 5 10*3/uL (ref 4.0–10.5)
nRBC: 0 % (ref 0.0–0.2)

## 2022-08-02 LAB — TSH: TSH: 1.059 u[IU]/mL (ref 0.350–4.500)

## 2022-08-02 MED ORDER — SODIUM CHLORIDE 0.9 % IV SOLN: Freq: Once | INTRAVENOUS | Status: AC

## 2022-08-02 MED ORDER — SODIUM CHLORIDE 0.9% FLUSH
10.0000 mL | Freq: Once | INTRAVENOUS | Status: AC
  Administered 2022-08-02: 10 mL

## 2022-08-02 MED ORDER — FAMOTIDINE IN NACL 20-0.9 MG/50ML-% IV SOLN
20.0000 mg | Freq: Once | INTRAVENOUS | Status: AC
  Administered 2022-08-02: 20 mg via INTRAVENOUS
  Filled 2022-08-02: qty 50

## 2022-08-02 MED ORDER — DIPHENHYDRAMINE HCL 50 MG/ML IJ SOLN
50.0000 mg | Freq: Once | INTRAMUSCULAR | Status: AC
  Administered 2022-08-02: 50 mg via INTRAVENOUS
  Filled 2022-08-02: qty 1

## 2022-08-02 MED ORDER — SODIUM CHLORIDE 0.9% FLUSH
10.0000 mL | INTRAVENOUS | Status: DC | PRN
  Administered 2022-08-02: 10 mL

## 2022-08-02 MED ORDER — SODIUM CHLORIDE 0.9 % IV SOLN
10.0000 mg | Freq: Once | INTRAVENOUS | Status: AC
  Administered 2022-08-02: 10 mg via INTRAVENOUS
  Filled 2022-08-02: qty 10

## 2022-08-02 MED ORDER — SODIUM CHLORIDE 0.9 % IV SOLN
80.0000 mg/m2 | Freq: Once | INTRAVENOUS | Status: AC
  Administered 2022-08-02: 174 mg via INTRAVENOUS
  Filled 2022-08-02: qty 29

## 2022-08-02 MED ORDER — HEPARIN SOD (PORK) LOCK FLUSH 100 UNIT/ML IV SOLN
500.0000 [IU] | Freq: Once | INTRAVENOUS | Status: AC | PRN
  Administered 2022-08-02: 500 [IU]

## 2022-08-02 NOTE — Patient Instructions (Signed)
[facility] MEDICAL ONCOLOGY  Discharge Instructions: Thank you for choosing [facility] to provide your oncology and hematology care.   If you have a lab appointment with the Cancer Center, please go directly to the Cancer Center and check in at the registration area.   Wear comfortable clothing and clothing appropriate for easy access to any Portacath or PICC line.   We strive to give you quality time with your provider. You may need to reschedule your appointment if you arrive late (15 or more minutes).  Arriving late affects you and other patients whose appointments are after yours.  Also, if you miss three or more appointments without notifying the office, you may be dismissed from the clinic at the provider's discretion.      For prescription refill requests, have your pharmacy contact our office and allow 72 hours for refills to be completed.    Today you received the following chemotherapy and/or immunotherapy agents: Taxol     To help prevent nausea and vomiting after your treatment, we encourage you to take your nausea medication as directed.  BELOW ARE SYMPTOMS THAT SHOULD BE REPORTED IMMEDIATELY: *FEVER GREATER THAN 100.4 F (38 C) OR HIGHER *CHILLS OR SWEATING *NAUSEA AND VOMITING THAT IS NOT CONTROLLED WITH YOUR NAUSEA MEDICATION *UNUSUAL SHORTNESS OF BREATH *UNUSUAL BRUISING OR BLEEDING *URINARY PROBLEMS (pain or burning when urinating, or frequent urination) *BOWEL PROBLEMS (unusual diarrhea, constipation, pain near the anus) TENDERNESS IN MOUTH AND THROAT WITH OR WITHOUT PRESENCE OF ULCERS (sore throat, sores in mouth, or a toothache) UNUSUAL RASH, SWELLING OR PAIN  UNUSUAL VAGINAL DISCHARGE OR ITCHING   Items with * indicate a potential emergency and should be followed up as soon as possible or go to the Emergency Department if any problems should occur.  Please show the CHEMOTHERAPY ALERT CARD or IMMUNOTHERAPY ALERT CARD at check-in to the  Emergency Department and triage nurse.  Should you have questions after your visit or need to cancel or reschedule your appointment, please contact [facility] MEDICAL ONCOLOGY  Dept: [phone]  and follow the prompts.  Office hours are 8:00 a.m. to 4:30 p.m. Monday - Friday. Please note that voicemails left after 4:00 p.m. may not be returned until the following business day.  We are closed weekends and major holidays. You have access to a nurse at all times for urgent questions. Please call the main number to the clinic Dept: [phone] and follow the prompts.   For any non-urgent questions, you may also contact your provider using MyChart. We now offer e-Visits for anyone 18 and older to request care online for non-urgent symptoms. For details visit [email or web address].   Also download the MyChart app! Go to the app store, search "MyChart", open the app, select [facility], and log in with your MyChart username and password.  Masks are optional in the cancer centers. If you would like for your care team to wear a mask while they are taking care of you, please let them know. You may have one support person who is at least 62 years old accompany you for your appointments. 

## 2022-08-02 NOTE — Progress Notes (Signed)
[facility] up:    Sydney Lee, [address] [place] [address]   DIAGNOSIS:  Cancer Staging  Malignant neoplasm of upper-inner quadrant of left breast in female, estrogen receptor negative ([place]) Staging form: Breast, AJCC 8th Edition - Clinical: Stage IIB (cT2, cN0, cM0, G3, ER-, PR-, HER2-) - Signed by Benay Pike, MD on 05/25/2022 Stage prefix: Initial diagnosis Histologic grading system: 3 grade system   SUMMARY OF ONCOLOGIC HISTORY: Oncology History  Malignant neoplasm of upper-inner quadrant of left breast in female, estrogen receptor negative ([place])  04/27/2022 Mammogram   Diagnostic mammogram showed indeterminate solid mass in the 10:00 location of the left breast.  Small satellite nodule adjacent to the index mass.  Borderline lymph node has cortical thickening of 2.9 mm.  Other lymph nodes have normal morphology.   05/16/2022 Pathology Results   Left breast needle core biopsy showed invasive poorly differentiated adenocarcinoma, grade 3 with tumor necrosis, lymph node biopsy benign reactive, negative for carcinoma.  Prognostics from the tumor showed ER 0%, negative, PR 0%, negative, Ki-67 of 80% and HER2 negative   05/23/2022 Initial Diagnosis   Malignant neoplasm of upper-inner quadrant of left breast in female, estrogen receptor negative ([place])   05/25/2022 Cancer Staging   Staging form: Breast, AJCC 8th Edition - Clinical: Stage IIB (cT2, cN0, cM0, G3, ER-, PR-, HER2-) - Signed by Benay Pike, MD on 05/25/2022 Stage prefix: Initial diagnosis Histologic grading system: 3 grade system   05/25/2022 Genetic Testing   [facility] CancerNext-Expanded Panel was Negative. Report date was 06/02/2022.  The CancerNext-Expanded gene panel offered by [facility] and includes sequencing, rearrangement, and RNA analysis for the following 77 genes: AIP, ALK, APC, ATM, AXIN2, BAP1, BARD1, BLM, BMPR1A, BRCA1, BRCA2, BRIP1, CDC73, CDH1, CDK4, CDKN1B,  CDKN2A, CHEK2, CTNNA1, DICER1, FANCC, FH, FLCN, GALNT12, KIF1B, LZTR1, MAX, MEN1, MET, MLH1, MSH2, MSH3, MSH6, MUTYH, NBN, NF1, NF2, NTHL1, PALB2, PHOX2B, PMS2, POT1, PRKAR1A, PTCH1, PTEN, RAD51C, RAD51D, RB1, RECQL, RET, SDHA, SDHAF2, SDHB, SDHC, SDHD, SMAD4, SMARCA4, SMARCB1, SMARCE1, STK11, SUFU, TMEM127, TP53, TSC1, TSC2, VHL and XRCC2 (sequencing and deletion/duplication); EGFR, EGLN1, HOXB13, KIT, MITF, PDGFRA, POLD1, and POLE (sequencing only); EPCAM and GREM1 (deletion/duplication only).    06/02/2022 Initial Biopsy   Additional left axillary node biopsy   06/06/2022 -  Chemotherapy   Patient is on Treatment Plan : BREAST Pembrolizumab (200) D1 + Carboplatin (5) D1 + Paclitaxel (80) D1,8,15 q21d X 4 cycles / Pembrolizumab (200) D1 + AC D1 q21d x 4 cycles       CURRENT THERAPY: Taxol/Carbo/Keytruda  INTERVAL HISTORY:  Sydney Lee 61 y.o. female returns for follow-up and evaluation prior to Cycle 3D8 of chemotherapy. She is unaccompanied for this visit.    Ms. Haynie reports that she is doing well although she is feeling a little sad and depressed due to her diagnosis and recent death of her mother.  She does have a therapist and has support through family and friends.  She has returned to work last Friday which has helped her mood significantly.  She reports her energy levels are pretty stable and she is able to complete her daily activities on her own.  She denies any nausea or vomiting.  She has occasional episodes of diarrhea that resolve on its own.  She denies any new breast pain or palpable lymph nodes.  Patient denies any fevers, chills, night sweats, shortness of breath, chest pain, cough, neuropathy or peripheral edema. She has  no other complaints.   Rest of the pertinent 10 point ROS reviewed and negative   Patient Active Problem List   Diagnosis Date Noted   Port-A-Cath in place 06/06/2022   Infusion reaction 06/06/2022   Genetic testing 06/03/2022   Family history of  breast cancer 05/25/2022   Family history of ovarian cancer 05/25/2022   Malignant neoplasm of upper-inner quadrant of left breast in female, estrogen receptor negative ([place]) 05/23/2022    is allergic to [facility] [aprepitant], codeine, and latex.  MEDICAL HISTORY: Past Medical History:  Diagnosis Date   Anxiety    Arthritis    Breast cancer ([place])    Depression    Diabetes mellitus without complication ([place])    Hypertension     SURGICAL HISTORY: Past Surgical History:  Procedure Laterality Date   PORTACATH PLACEMENT N/A 06/01/2022   Procedure: INSERTION PORT-A-CATH WITH ULTRASOUND GUIDANCE;  Surgeon: Stark Klein, MD;  Location: [facility];  Service: General;  Laterality: N/A;    SOCIAL HISTORY: Social History   Socioeconomic History   Marital status: Married    Spouse name: Not on file   Number of children: Not on file   Years of education: Not on file   Highest education level: Not on file  Occupational History   Not on file  Tobacco Use   Smoking status: Former    Packs/day: 1.00    Years: 40.00    Total pack years: 40.00    Types: Cigarettes    Quit date: 06/05/2022    Years since quitting: 0.1   Smokeless tobacco: Never  Substance and Sexual Activity   Alcohol use: No   Drug use: No   Sexual activity: Not on file  Other Topics Concern   Not on file  Social History Narrative   Not on file   Social Determinants of Health   Financial Resource Strain: High Risk (06/14/2022)   Overall Financial Resource Strain (CARDIA)    Difficulty of Paying Living Expenses: Hard  Food Insecurity: Food Insecurity Present (06/14/2022)   Hunger Vital Sign    Worried About Running Out of Food in the Last Year: Sometimes true    Ran Out of Food in the Last Year: Never true  Transportation Needs: No Transportation Needs (06/14/2022)   PRAPARE - Hydrologist (Medical): No    Lack of Transportation (Non-Medical): No  Physical Activity: Not on file  Stress:  Not on file  Social Connections: Not on file  Intimate Partner Violence: Not on file    FAMILY HISTORY: Family History  Problem Relation Age of Onset   Leukemia Mother 11   Throat cancer Maternal Uncle 85   Prostate cancer Maternal Uncle    Breast cancer Cousin 93       maternal first cousin   Ovarian cancer Cousin        maternal first cousin   Breast cancer Cousin 33 - 98       paternal first cousin   Breast cancer Cousin 74 - 37       paternal first cousin    Review of Systems  Constitutional:  Negative for appetite change, chills, fatigue, fever and unexpected weight change.  HENT:   Negative for hearing loss, lump/mass and trouble swallowing.   Eyes:  Negative for eye problems and icterus.  Respiratory:  Negative for chest tightness, cough and shortness of breath.   Cardiovascular:  Negative for chest pain, leg swelling and palpitations.  Gastrointestinal:  Negative  for abdominal distention, abdominal pain, constipation, diarrhea, nausea and vomiting.  Endocrine: Negative for hot flashes.  Genitourinary:  Negative for difficulty urinating.   Musculoskeletal:  Negative for arthralgias.  Skin:  Negative for itching and rash.  Neurological:  Negative for dizziness, extremity weakness, headaches and numbness.  Hematological:  Negative for adenopathy. Does not bruise/bleed easily.  Psychiatric/Behavioral:  Positive for depression. The patient is not nervous/anxious.       PHYSICAL EXAMINATION  ECOG PERFORMANCE STATUS: 1 - Symptomatic but completely ambulatory  Vitals:   08/02/22 0831  BP: 120/76  Pulse: 94  Resp: 17  Temp: (!) 97.3 F (36.3 C)  SpO2: 99%    Physical Exam Constitutional:      General: She is not in acute distress.    Appearance: Normal appearance. She is not toxic-appearing.  HENT:     Head: Normocephalic and atraumatic.  Eyes:     General: No scleral icterus. Cardiovascular:     Rate and Rhythm: Normal rate and regular rhythm.     Pulses:  Normal pulses.     Heart sounds: Normal heart sounds.  Pulmonary:     Effort: Pulmonary effort is normal.     Breath sounds: Normal breath sounds.  Chest:     Comments: No palpable mass noted in the left upper inner breast or axillary lymphadenopathy   Abdominal:     General: Abdomen is flat. Bowel sounds are normal. There is no distension.     Palpations: Abdomen is soft.     Tenderness: There is no abdominal tenderness.  Musculoskeletal:        General: No swelling.     Cervical back: Neck supple.  Lymphadenopathy:     Cervical: No cervical adenopathy.  Skin:    General: Skin is warm and dry.     Findings: No rash.  Neurological:     General: No focal deficit present.     Mental Status: She is alert.  Psychiatric:        Mood and Affect: Mood normal.        Behavior: Behavior normal.     LABORATORY DATA:  CBC    Component Value Date/Time   WBC 5.0 08/02/2022 0757   WBC 4.8 12/22/2018 2150   RBC 3.21 (L) 08/02/2022 0757   HGB 10.0 (L) 08/02/2022 0757   HCT 28.6 (L) 08/02/2022 0757   PLT 461 (H) 08/02/2022 0757   MCV 89.1 08/02/2022 0757   MCH 31.2 08/02/2022 0757   MCHC 35.0 08/02/2022 0757   RDW 15.9 (H) 08/02/2022 0757   LYMPHSABS 2.4 08/02/2022 0757   MONOABS 0.4 08/02/2022 0757   EOSABS 0.0 08/02/2022 0757   BASOSABS 0.0 08/02/2022 0757    CMP     Component Value Date/Time   NA 137 08/02/2022 0757   K 3.4 (L) 08/02/2022 0757   CL 106 08/02/2022 0757   CO2 27 08/02/2022 0757   GLUCOSE 149 (H) 08/02/2022 0757   BUN 10 08/02/2022 0757   CREATININE 0.47 08/02/2022 0757   CALCIUM 9.0 08/02/2022 0757   PROT 6.3 (L) 08/02/2022 0757   ALBUMIN 3.8 08/02/2022 0757   AST 10 (L) 08/02/2022 0757   ALT 9 08/02/2022 0757   ALKPHOS 68 08/02/2022 0757   BILITOT 0.3 08/02/2022 0757   GFRNONAA >60 08/02/2022 0757   GFRAA >60 12/22/2018 2150       ASSESSMENT and THERAPY PLAN:  HETVI SHAWHAN is a 61 y.o. female who presents to the clinic for stage  IIb  triple negative breast cancer.   #Stage IIb triple negative breast cancer  --Currently receiving neoadjuvant chemotherapy with carbo/taxol and keytruda x 4 cycles followed by adriamycin, cytoxan and keytruda x 4 cycles.  --Due for Cycle 3-day 8 today. --Labs from today were reviewed and adequate for treatment. WBC 5.0, ANC 2.2, Hgb 10.0, Plt 461.  --No prohibitive toxicities so she can proceed with treatment today without any dose modifications.  --RTC next week for Cycle 3, day 15 of taxol. She will f/u with Dr. Chryl Heck on 08/17/2022 before Cycle 4, Day 1  #Neutropenia/thrombocytopenia: --Secondary to chemotherapy --Delayed Cycle 3 treatment by one week due neutropenia and thrombocytopenia with ANC 0.7 and Plt 68K --Patient receives GCSF injection x 3 days after day 15 of chemotherapy --Labs today show ANC 2.2 and Platelet count 461K. No interventions today --Continue to monitor.   #Diarrhea:  --Mild, grade 1 --Monitor and advised to take imodium as needed   All questions were answered. The patient knows to call the clinic with any problems, questions or concerns. We can certainly see the patient much sooner if necessary.  I have spent a total of 30 minutes minutes of face-to-face and non-face-to-face time, preparing to see the patient, performing a medically appropriate examination, counseling and educating the patient, documenting clinical information in the electronic health record, and care coordination.   Dede Query PA-C Dept of Hematology and [place] at [facility] Phone: [phone]

## 2022-08-03 ENCOUNTER — Other Ambulatory Visit: Payer: Self-pay

## 2022-08-03 LAB — T4: T4, Total: 7 ug/dL (ref 4.5–12.0)

## 2022-08-09 MED FILL — Dexamethasone Sodium Phosphate Inj 100 MG/10ML: INTRAMUSCULAR | Qty: 1 | Status: AC

## 2022-08-10 ENCOUNTER — Inpatient Hospital Stay: Payer: BC Managed Care – PPO

## 2022-08-10 ENCOUNTER — Other Ambulatory Visit: Payer: Self-pay

## 2022-08-10 ENCOUNTER — Inpatient Hospital Stay: Payer: BC Managed Care – PPO | Attending: Physician Assistant

## 2022-08-10 VITALS — BP 99/66 | HR 95 | Temp 98.3°F | Resp 16 | Wt 210.8 lb

## 2022-08-10 DIAGNOSIS — Z5112 Encounter for antineoplastic immunotherapy: Secondary | ICD-10-CM | POA: Diagnosis present

## 2022-08-10 DIAGNOSIS — C50212 Malignant neoplasm of upper-inner quadrant of left female breast: Secondary | ICD-10-CM

## 2022-08-10 DIAGNOSIS — Z79899 Other long term (current) drug therapy: Secondary | ICD-10-CM | POA: Insufficient documentation

## 2022-08-10 DIAGNOSIS — Z5189 Encounter for other specified aftercare: Secondary | ICD-10-CM | POA: Diagnosis not present

## 2022-08-10 DIAGNOSIS — Z171 Estrogen receptor negative status [ER-]: Secondary | ICD-10-CM | POA: Insufficient documentation

## 2022-08-10 DIAGNOSIS — Z5111 Encounter for antineoplastic chemotherapy: Secondary | ICD-10-CM | POA: Diagnosis present

## 2022-08-10 DIAGNOSIS — D6481 Anemia due to antineoplastic chemotherapy: Secondary | ICD-10-CM | POA: Diagnosis not present

## 2022-08-10 DIAGNOSIS — Z95828 Presence of other vascular implants and grafts: Secondary | ICD-10-CM

## 2022-08-10 LAB — CMP (CANCER CENTER ONLY)
ALT: 10 U/L (ref 0–44)
AST: 11 U/L — ABNORMAL LOW (ref 15–41)
Albumin: 3.9 g/dL (ref 3.5–5.0)
Alkaline Phosphatase: 66 U/L (ref 38–126)
Anion gap: 7 (ref 5–15)
BUN: 10 mg/dL (ref 8–23)
CO2: 26 mmol/L (ref 22–32)
Calcium: 9 mg/dL (ref 8.9–10.3)
Chloride: 107 mmol/L (ref 98–111)
Creatinine: 0.63 mg/dL (ref 0.44–1.00)
GFR, Estimated: >60 mL/min (ref >60)
Glucose, Bld: 117 mg/dL — ABNORMAL HIGH (ref 70–99)
Potassium: 3.5 mmol/L (ref 3.5–5.1)
Sodium: 140 mmol/L (ref 135–145)
Total Bilirubin: 0.3 mg/dL (ref 0.3–1.2)
Total Protein: 6.7 g/dL (ref 6.5–8.1)

## 2022-08-10 LAB — CBC WITH DIFFERENTIAL (CANCER CENTER ONLY)
Abs Immature Granulocytes: 0.01 10*3/uL (ref 0.00–0.07)
Basophils Absolute: 0 10*3/uL (ref 0.0–0.1)
Basophils Relative: 0 %
Eosinophils Absolute: 0 10*3/uL (ref 0.0–0.5)
Eosinophils Relative: 0 %
HCT: 28 % — ABNORMAL LOW (ref 36.0–46.0)
Hemoglobin: 9.4 g/dL — ABNORMAL LOW (ref 12.0–15.0)
Immature Granulocytes: 0 %
Lymphocytes Relative: 51 %
Lymphs Abs: 1.6 10*3/uL (ref 0.7–4.0)
MCH: 31.1 pg (ref 26.0–34.0)
MCHC: 33.6 g/dL (ref 30.0–36.0)
MCV: 92.7 fL (ref 80.0–100.0)
Monocytes Absolute: 0.3 10*3/uL (ref 0.1–1.0)
Monocytes Relative: 11 %
Neutro Abs: 1.2 10*3/uL — ABNORMAL LOW (ref 1.7–7.7)
Neutrophils Relative %: 38 %
Platelet Count: 119 10*3/uL — ABNORMAL LOW (ref 150–400)
RBC: 3.02 MIL/uL — ABNORMAL LOW (ref 3.87–5.11)
RDW: 17.3 % — ABNORMAL HIGH (ref 11.5–15.5)
WBC Count: 3.2 10*3/uL — ABNORMAL LOW (ref 4.0–10.5)
nRBC: 0.6 % — ABNORMAL HIGH (ref 0.0–0.2)

## 2022-08-10 LAB — TSH: TSH: 0.965 u[IU]/mL (ref 0.350–4.500)

## 2022-08-10 MED ORDER — SODIUM CHLORIDE 0.9% FLUSH
10.0000 mL | Freq: Once | INTRAVENOUS | Status: AC
  Administered 2022-08-10: 10 mL

## 2022-08-10 MED ORDER — HEPARIN SOD (PORK) LOCK FLUSH 100 UNIT/ML IV SOLN
500.0000 [IU] | Freq: Once | INTRAVENOUS | Status: AC | PRN
  Administered 2022-08-10: 500 [IU]

## 2022-08-10 MED ORDER — SODIUM CHLORIDE 0.9 % IV SOLN: Freq: Once | INTRAVENOUS | Status: AC

## 2022-08-10 MED ORDER — DIPHENHYDRAMINE HCL 50 MG/ML IJ SOLN
50.0000 mg | Freq: Once | INTRAMUSCULAR | Status: AC
  Administered 2022-08-10: 50 mg via INTRAVENOUS
  Filled 2022-08-10: qty 1

## 2022-08-10 MED ORDER — SODIUM CHLORIDE 0.9 % IV SOLN
80.0000 mg/m2 | Freq: Once | INTRAVENOUS | Status: AC
  Administered 2022-08-10: 174 mg via INTRAVENOUS
  Filled 2022-08-10: qty 29

## 2022-08-10 MED ORDER — SODIUM CHLORIDE 0.9 % IV SOLN
10.0000 mg | Freq: Once | INTRAVENOUS | Status: AC
  Administered 2022-08-10: 10 mg via INTRAVENOUS
  Filled 2022-08-10: qty 10

## 2022-08-10 MED ORDER — FAMOTIDINE IN NACL 20-0.9 MG/50ML-% IV SOLN
20.0000 mg | Freq: Once | INTRAVENOUS | Status: AC
  Administered 2022-08-10: 20 mg via INTRAVENOUS
  Filled 2022-08-10: qty 50

## 2022-08-10 MED ORDER — SODIUM CHLORIDE 0.9% FLUSH
10.0000 mL | INTRAVENOUS | Status: DC | PRN
  Administered 2022-08-10: 10 mL

## 2022-08-10 NOTE — Patient Instructions (Signed)
[facility] MEDICAL ONCOLOGY   Discharge Instructions: Thank you for choosing [facility] to provide your oncology and hematology care.   If you have a lab appointment with the Cancer Center, please go directly to the Cancer Center and check in at the registration area.   Wear comfortable clothing and clothing appropriate for easy access to any Portacath or PICC line.   We strive to give you quality time with your provider. You may need to reschedule your appointment if you arrive late (15 or more minutes).  Arriving late affects you and other patients whose appointments are after yours.  Also, if you miss three or more appointments without notifying the office, you may be dismissed from the clinic at the provider's discretion.      For prescription refill requests, have your pharmacy contact our office and allow 72 hours for refills to be completed.    Today you received the following chemotherapy and/or immunotherapy agents: paclitaxel      To help prevent nausea and vomiting after your treatment, we encourage you to take your nausea medication as directed.  BELOW ARE SYMPTOMS THAT SHOULD BE REPORTED IMMEDIATELY: *FEVER GREATER THAN 100.4 F (38 C) OR HIGHER *CHILLS OR SWEATING *NAUSEA AND VOMITING THAT IS NOT CONTROLLED WITH YOUR NAUSEA MEDICATION *UNUSUAL SHORTNESS OF BREATH *UNUSUAL BRUISING OR BLEEDING *URINARY PROBLEMS (pain or burning when urinating, or frequent urination) *BOWEL PROBLEMS (unusual diarrhea, constipation, pain near the anus) TENDERNESS IN MOUTH AND THROAT WITH OR WITHOUT PRESENCE OF ULCERS (sore throat, sores in mouth, or a toothache) UNUSUAL RASH, SWELLING OR PAIN  UNUSUAL VAGINAL DISCHARGE OR ITCHING   Items with * indicate a potential emergency and should be followed up as soon as possible or go to the Emergency Department if any problems should occur.  Please show the CHEMOTHERAPY ALERT CARD or IMMUNOTHERAPY ALERT CARD at check-in  to the Emergency Department and triage nurse.  Should you have questions after your visit or need to cancel or reschedule your appointment, please contact [facility]ICAL [facility]COLOG[facility]  Dept: [phone]  and follow the prompts.  Office hours are 8:00 a.m. to 4:30 p.m. Monday - Friday. Please note that voicemails left after 4:00 p.m. may not be returned until the following business day.  We are closed weekends and major holidays. You have access to a nurse at all times for urgent questions. Please call the main number to the clinic Dept: [phone] and follow the prompts.   For any non-urgent questions, you may also contact your provider using MyChart. We now offer e-Visits for anyone 18 and older to request care online for non-urgent symptoms. For details visit [email or web address].   Also download the MyChart app! Go to the app store, search "MyChart", open the app, select [facility], and log in with your MyChart username and password.  Masks are optional in the cancer centers. If you would like for your care team to wear a mask while they are taking care of you, please let them know. You may have one support person who is at least 62 years old accompany you for your appointments. 

## 2022-08-10 NOTE — Progress Notes (Signed)
Per Dr Chryl Heck, ok to treat with ANC 1.2 today.

## 2022-08-11 ENCOUNTER — Inpatient Hospital Stay: Payer: BC Managed Care – PPO

## 2022-08-11 VITALS — BP 116/74 | HR 100 | Temp 98.5°F | Resp 18

## 2022-08-11 DIAGNOSIS — Z5112 Encounter for antineoplastic immunotherapy: Secondary | ICD-10-CM | POA: Diagnosis not present

## 2022-08-11 DIAGNOSIS — C50212 Malignant neoplasm of upper-inner quadrant of left female breast: Secondary | ICD-10-CM

## 2022-08-11 LAB — T4: T4, Total: 6.5 ug/dL (ref 4.5–12.0)

## 2022-08-11 MED ORDER — FILGRASTIM-AAFI 480 MCG/0.8ML IJ SOSY
480.0000 ug | PREFILLED_SYRINGE | Freq: Once | INTRAMUSCULAR | Status: AC
  Administered 2022-08-11: 480 ug via SUBCUTANEOUS
  Filled 2022-08-11: qty 0.8

## 2022-08-12 ENCOUNTER — Inpatient Hospital Stay: Payer: BC Managed Care – PPO

## 2022-08-12 ENCOUNTER — Other Ambulatory Visit: Payer: Self-pay

## 2022-08-12 VITALS — BP 130/73 | HR 88 | Temp 98.4°F | Resp 18

## 2022-08-12 DIAGNOSIS — C50212 Malignant neoplasm of upper-inner quadrant of left female breast: Secondary | ICD-10-CM

## 2022-08-12 DIAGNOSIS — Z5112 Encounter for antineoplastic immunotherapy: Secondary | ICD-10-CM | POA: Diagnosis not present

## 2022-08-12 MED ORDER — FILGRASTIM-AAFI 480 MCG/0.8ML IJ SOSY
480.0000 ug | PREFILLED_SYRINGE | Freq: Once | INTRAMUSCULAR | Status: AC
  Administered 2022-08-12: 480 ug via SUBCUTANEOUS
  Filled 2022-08-12: qty 0.8

## 2022-08-13 ENCOUNTER — Inpatient Hospital Stay: Payer: BC Managed Care – PPO

## 2022-08-13 VITALS — BP 137/80 | HR 80 | Temp 98.7°F | Resp 18 | Ht 69.0 in

## 2022-08-13 DIAGNOSIS — Z171 Estrogen receptor negative status [ER-]: Secondary | ICD-10-CM

## 2022-08-13 DIAGNOSIS — Z5112 Encounter for antineoplastic immunotherapy: Secondary | ICD-10-CM | POA: Diagnosis not present

## 2022-08-13 MED ORDER — FILGRASTIM-AAFI 480 MCG/0.8ML IJ SOSY
480.0000 ug | PREFILLED_SYRINGE | Freq: Once | INTRAMUSCULAR | Status: AC
  Administered 2022-08-13: 480 ug via SUBCUTANEOUS
  Filled 2022-08-13: qty 0.8

## 2022-08-16 MED FILL — Fosaprepitant Dimeglumine For IV Infusion 150 MG (Base Eq): INTRAVENOUS | Qty: 5 | Status: AC

## 2022-08-16 MED FILL — Dexamethasone Sodium Phosphate Inj 100 MG/10ML: INTRAMUSCULAR | Qty: 1 | Status: AC

## 2022-08-17 ENCOUNTER — Other Ambulatory Visit: Payer: Self-pay | Admitting: Hematology and Oncology

## 2022-08-17 ENCOUNTER — Encounter: Payer: Self-pay | Admitting: *Deleted

## 2022-08-17 ENCOUNTER — Inpatient Hospital Stay: Payer: BC Managed Care – PPO

## 2022-08-17 ENCOUNTER — Other Ambulatory Visit: Payer: Self-pay

## 2022-08-17 ENCOUNTER — Inpatient Hospital Stay (HOSPITAL_BASED_OUTPATIENT_CLINIC_OR_DEPARTMENT_OTHER): Payer: BC Managed Care – PPO | Admitting: Hematology and Oncology

## 2022-08-17 ENCOUNTER — Encounter: Payer: Self-pay | Admitting: Hematology and Oncology

## 2022-08-17 VITALS — BP 124/87 | HR 95

## 2022-08-17 DIAGNOSIS — C50212 Malignant neoplasm of upper-inner quadrant of left female breast: Secondary | ICD-10-CM

## 2022-08-17 DIAGNOSIS — Z95828 Presence of other vascular implants and grafts: Secondary | ICD-10-CM

## 2022-08-17 DIAGNOSIS — Z5112 Encounter for antineoplastic immunotherapy: Secondary | ICD-10-CM | POA: Diagnosis not present

## 2022-08-17 DIAGNOSIS — Z171 Estrogen receptor negative status [ER-]: Secondary | ICD-10-CM

## 2022-08-17 LAB — CMP (CANCER CENTER ONLY)
ALT: 11 U/L (ref 0–44)
AST: 12 U/L — ABNORMAL LOW (ref 15–41)
Albumin: 3.8 g/dL (ref 3.5–5.0)
Alkaline Phosphatase: 81 U/L (ref 38–126)
Anion gap: 6 (ref 5–15)
BUN: 8 mg/dL (ref 8–23)
CO2: 27 mmol/L (ref 22–32)
Calcium: 9 mg/dL (ref 8.9–10.3)
Chloride: 107 mmol/L (ref 98–111)
Creatinine: 0.63 mg/dL (ref 0.44–1.00)
GFR, Estimated: >60 mL/min (ref >60)
Glucose, Bld: 157 mg/dL — ABNORMAL HIGH (ref 70–99)
Potassium: 3.2 mmol/L — ABNORMAL LOW (ref 3.5–5.1)
Sodium: 140 mmol/L (ref 135–145)
Total Bilirubin: 0.3 mg/dL (ref 0.3–1.2)
Total Protein: 6.1 g/dL — ABNORMAL LOW (ref 6.5–8.1)

## 2022-08-17 LAB — CBC WITH DIFFERENTIAL (CANCER CENTER ONLY)
Abs Immature Granulocytes: 1.93 10*3/uL — ABNORMAL HIGH (ref 0.00–0.07)
Basophils Absolute: 0.1 10*3/uL (ref 0.0–0.1)
Basophils Relative: 1 %
Eosinophils Absolute: 0 10*3/uL (ref 0.0–0.5)
Eosinophils Relative: 0 %
HCT: 28.4 % — ABNORMAL LOW (ref 36.0–46.0)
Hemoglobin: 9.6 g/dL — ABNORMAL LOW (ref 12.0–15.0)
Immature Granulocytes: 16 %
Lymphocytes Relative: 27 %
Lymphs Abs: 3.2 10*3/uL (ref 0.7–4.0)
MCH: 31.8 pg (ref 26.0–34.0)
MCHC: 33.8 g/dL (ref 30.0–36.0)
MCV: 94 fL (ref 80.0–100.0)
Monocytes Absolute: 1.3 10*3/uL — ABNORMAL HIGH (ref 0.1–1.0)
Monocytes Relative: 11 %
Neutro Abs: 5.4 10*3/uL (ref 1.7–7.7)
Neutrophils Relative %: 45 %
Platelet Count: 213 10*3/uL (ref 150–400)
RBC: 3.02 MIL/uL — ABNORMAL LOW (ref 3.87–5.11)
RDW: 19.2 % — ABNORMAL HIGH (ref 11.5–15.5)
Smear Review: NORMAL
WBC Count: 11.9 10*3/uL — ABNORMAL HIGH (ref 4.0–10.5)
nRBC: 1.3 % — ABNORMAL HIGH (ref 0.0–0.2)

## 2022-08-17 MED ORDER — SODIUM CHLORIDE 0.9% FLUSH
10.0000 mL | Freq: Once | INTRAVENOUS | Status: AC
  Administered 2022-08-17: 10 mL

## 2022-08-17 MED ORDER — SODIUM CHLORIDE 0.9 % IV SOLN: Freq: Once | INTRAVENOUS | Status: AC

## 2022-08-17 MED ORDER — SODIUM CHLORIDE 0.9 % IV SOLN
200.0000 mg | Freq: Once | INTRAVENOUS | Status: AC
  Administered 2022-08-17: 200 mg via INTRAVENOUS
  Filled 2022-08-17: qty 200

## 2022-08-17 MED ORDER — FAMOTIDINE IN NACL 20-0.9 MG/50ML-% IV SOLN
20.0000 mg | Freq: Once | INTRAVENOUS | Status: AC
  Administered 2022-08-17: 20 mg via INTRAVENOUS
  Filled 2022-08-17: qty 50

## 2022-08-17 MED ORDER — PALONOSETRON HCL INJECTION 0.25 MG/5ML
0.2500 mg | Freq: Once | INTRAVENOUS | Status: AC
  Administered 2022-08-17: 0.25 mg via INTRAVENOUS
  Filled 2022-08-17: qty 5

## 2022-08-17 MED ORDER — HEPARIN SOD (PORK) LOCK FLUSH 100 UNIT/ML IV SOLN
500.0000 [IU] | Freq: Once | INTRAVENOUS | Status: AC | PRN
  Administered 2022-08-17: 500 [IU]

## 2022-08-17 MED ORDER — DIPHENHYDRAMINE HCL 50 MG/ML IJ SOLN
50.0000 mg | Freq: Once | INTRAMUSCULAR | Status: AC
  Administered 2022-08-17: 50 mg via INTRAVENOUS
  Filled 2022-08-17: qty 1

## 2022-08-17 MED ORDER — SODIUM CHLORIDE 0.9 % IV SOLN
10.0000 mg | Freq: Once | INTRAVENOUS | Status: AC
  Administered 2022-08-17: 10 mg via INTRAVENOUS
  Filled 2022-08-17: qty 10

## 2022-08-17 MED ORDER — SODIUM CHLORIDE 0.9 % IV SOLN
80.0000 mg/m2 | Freq: Once | INTRAVENOUS | Status: AC
  Administered 2022-08-17: 174 mg via INTRAVENOUS
  Filled 2022-08-17: qty 29

## 2022-08-17 MED ORDER — SODIUM CHLORIDE 0.9% FLUSH
10.0000 mL | INTRAVENOUS | Status: DC | PRN
  Administered 2022-08-17: 10 mL

## 2022-08-17 MED ORDER — SODIUM CHLORIDE 0.9 % IV SOLN
676.5000 mg | Freq: Once | INTRAVENOUS | Status: AC
  Administered 2022-08-17: 680 mg via INTRAVENOUS
  Filled 2022-08-17: qty 68

## 2022-08-17 NOTE — Progress Notes (Signed)
[facility] Follow up:    Sydney Lee, Sydney Lee   DIAGNOSIS:  Cancer Staging  Malignant neoplasm of upper-inner quadrant of left breast in female, estrogen receptor negative ([place]) Staging form: Breast, AJCC 8th Edition - Clinical: Stage IIB (cT2, cN0, cM0, G3, ER-, PR-, HER2-) - Signed by Benay Pike, MD on 05/25/2022 Stage prefix: Initial diagnosis Histologic grading system: 3 grade system   SUMMARY OF ONCOLOGIC HISTORY: Oncology History  Malignant neoplasm of upper-inner quadrant of left breast in female, estrogen receptor negative ([place])  04/27/2022 Mammogram   Diagnostic mammogram showed indeterminate solid mass in the 10:00 location of the left breast.  Small satellite nodule adjacent to the index mass.  Borderline lymph node has cortical thickening of 2.9 mm.  Other lymph nodes have normal morphology.   05/16/2022 Pathology Results   Left breast needle core biopsy showed invasive poorly differentiated adenocarcinoma, grade 3 with tumor necrosis, lymph node biopsy benign reactive, negative for carcinoma.  Prognostics from the tumor showed ER 0%, negative, PR 0%, negative, Ki-67 of 80% and HER2 negative   05/23/2022 Initial Diagnosis   Malignant neoplasm of upper-inner quadrant of left breast in female, estrogen receptor negative ([place])   05/25/2022 Cancer Staging   Staging form: Breast, AJCC 8th Edition - Clinical: Stage IIB (cT2, cN0, cM0, G3, ER-, PR-, HER2-) - Signed by Benay Pike, MD on 05/25/2022 Stage prefix: Initial diagnosis Histologic grading system: 3 grade system   05/25/2022 Genetic Testing   [facility] CancerNext-Expanded Panel was Negative. Report date was 06/02/2022.  The CancerNext-Expanded gene panel offered by [facility] and includes sequencing, rearrangement, and RNA analysis for the following 77 genes: AIP, ALK, APC, ATM, AXIN2, BAP1, BARD1, BLM, BMPR1A, BRCA1, BRCA2, BRIP1, CDC73, CDH1, CDK4, CDKN1B,  CDKN2A, CHEK2, CTNNA1, DICER1, FANCC, FH, FLCN, GALNT12, KIF1B, LZTR1, MAX, MEN1, MET, MLH1, MSH2, MSH3, MSH6, MUTYH, NBN, NF1, NF2, NTHL1, PALB2, PHOX2B, PMS2, POT1, PRKAR1A, PTCH1, PTEN, RAD51C, RAD51D, RB1, RECQL, RET, SDHA, SDHAF2, SDHB, SDHC, SDHD, SMAD4, SMARCA4, SMARCB1, SMARCE1, STK11, SUFU, TMEM127, TP53, TSC1, TSC2, VHL and XRCC2 (sequencing and deletion/duplication); EGFR, EGLN1, HOXB13, KIT, MITF, PDGFRA, POLD1, and POLE (sequencing only); EPCAM and GREM1 (deletion/duplication only).    06/02/2022 Initial Biopsy   Additional left axillary node biopsy   06/06/2022 - 08/02/2022 Chemotherapy   Patient is on Treatment Plan : BREAST Pembrolizumab (200) D1 + Carboplatin (5) D1 + Paclitaxel (80) D1,8,15 q21d X 4 cycles / Pembrolizumab (200) D1 + AC D1 q21d x 4 cycles     06/06/2022 -  Chemotherapy   Patient is on Treatment Plan : BREAST Pembrolizumab (200) D1 + Carboplatin (5) D1 + Paclitaxel (80) D1,8,15 q21d X 4 cycles / Pembrolizumab (200) D1 + AC D1 q21d x 4 cycles       CURRENT THERAPY: Taxol/Carbo/Keytruda  INTERVAL HISTORY:  Sydney Lee 61 y.o. female returns for follow-up and evaluation prior to Cycle 3D8 of chemotherapy. She is unaccompanied for this visit.   Sydney Lee is here for a follow up. Since last visit, she is doing mostly well except for lot of stress given her husband's health. She otherwise is tolerating chemo very well. No nausea, vomiting or diarrhea. She denies any nausea, vomiting or diarrhea.  No neuropathy reported. She tells me that overall she is tolerating the chemotherapy very well.  She cannot feel the tumor in her breast anymore.  She continues to work and is hoping to do the same. Rest  of the pertinent 10 point ROS reviewed and negative   Patient Active Problem List   Diagnosis Date Noted   Port-A-Cath in place 06/06/2022   Infusion reaction 06/06/2022   Genetic testing 06/03/2022   Family history of breast cancer 05/25/2022   Family history of  ovarian cancer 05/25/2022   Malignant neoplasm of upper-inner quadrant of left breast in female, estrogen receptor negative ([place]) 05/23/2022    is allergic to [facility] [aprepitant], codeine, and latex.  MEDICAL HISTORY: Past Medical History:  Diagnosis Date   Anxiety    Arthritis    Breast cancer ([place])    Depression    Diabetes mellitus without complication ([place])    Hypertension     SURGICAL HISTORY: Past Surgical History:  Procedure Laterality Date   PORTACATH PLACEMENT N/A 06/01/2022   Procedure: INSERTION PORT-A-CATH WITH ULTRASOUND GUIDANCE;  Surgeon: Stark Klein, MD;  Location: [facility];  Service: General;  Laterality: N/A;    SOCIAL HISTORY: Social History   Socioeconomic History   Marital status: Married    Spouse name: Not on file   Number of children: Not on file   Years of education: Not on file   Highest education level: Not on file  Occupational History   Not on file  Tobacco Use   Smoking status: Former    Packs/day: 1.00    Years: 40.00    Total pack years: 40.00    Types: Cigarettes    Quit date: 06/05/2022    Years since quitting: 0.2   Smokeless tobacco: Never  Substance and Sexual Activity   Alcohol use: No   Drug use: No   Sexual activity: Not on file  Other Topics Concern   Not on file  Social History Narrative   Not on file   Social Determinants of Health   Financial Resource Strain: High Risk (06/14/2022)   Overall Financial Resource Strain (CARDIA)    Difficulty of Paying Living Expenses: Hard  Food Insecurity: Food Insecurity Present (06/14/2022)   Hunger Vital Sign    Worried About Running Out of Food in the Last Year: Sometimes true    Ran Out of Food in the Last Year: Never true  Transportation Needs: No Transportation Needs (06/14/2022)   PRAPARE - Hydrologist (Medical): No    Lack of Transportation (Non-Medical): No  Physical Activity: Not on file  Stress: Not on file  Social Connections: Not on file   Intimate Partner Violence: Not on file    FAMILY HISTORY: Family History  Problem Relation Age of Onset   Leukemia Mother 62   Throat cancer Maternal Uncle 85   Prostate cancer Maternal Uncle    Breast cancer Cousin 88       maternal first cousin   Ovarian cancer Cousin        maternal first cousin   Breast cancer Cousin 69 - 75       paternal first cousin   Breast cancer Cousin 11 - 86       paternal first cousin    Review of Systems  Constitutional:  Negative for appetite change, chills, fatigue, fever and unexpected weight change.  HENT:   Negative for hearing loss, lump/mass and trouble swallowing.   Eyes:  Negative for eye problems and icterus.  Respiratory:  Negative for chest tightness, cough and shortness of breath.   Cardiovascular:  Negative for chest pain, leg swelling and palpitations.  Gastrointestinal:  Negative for abdominal distention, abdominal pain, constipation,  diarrhea, nausea and vomiting.  Endocrine: Negative for hot flashes.  Genitourinary:  Negative for difficulty urinating.   Musculoskeletal:  Negative for arthralgias.  Skin:  Negative for itching and rash.  Neurological:  Negative for dizziness, extremity weakness, headaches and numbness.  Hematological:  Negative for adenopathy. Does not bruise/bleed easily.  Psychiatric/Behavioral:  Positive for depression. The patient is not nervous/anxious.       PHYSICAL EXAMINATION  ECOG PERFORMANCE STATUS: 1 - Symptomatic but completely ambulatory  Vitals:   08/17/22 0846  BP: 139/80  Pulse: (!) 105  Resp: 16  Temp: 97.7 F (36.5 C)  SpO2: 99%    Physical Exam Constitutional:      General: She is not in acute distress.    Appearance: Normal appearance. She is not toxic-appearing.  HENT:     Head: Normocephalic and atraumatic.  Eyes:     General: No scleral icterus. Cardiovascular:     Rate and Rhythm: Normal rate and regular rhythm.     Pulses: Normal pulses.     Heart sounds: Normal  heart sounds.  Pulmonary:     Effort: Pulmonary effort is normal.     Breath sounds: Normal breath sounds.  Chest:     Comments: No palpable mass noted in the left upper inner breast or axillary lymphadenopathy   Abdominal:     General: Abdomen is flat. Bowel sounds are normal. There is no distension.     Palpations: Abdomen is soft.     Tenderness: There is no abdominal tenderness.  Musculoskeletal:        General: No swelling.     Cervical back: Neck supple.  Lymphadenopathy:     Cervical: No cervical adenopathy.  Skin:    General: Skin is warm and dry.     Findings: No rash.  Neurological:     General: No focal deficit present.     Mental Status: She is alert.  Psychiatric:        Mood and Affect: Mood normal.        Behavior: Behavior normal.     LABORATORY DATA:  CBC    Component Value Date/Time   WBC 11.9 (H) 08/17/2022 0828   WBC 4.8 12/22/2018 2150   RBC 3.02 (L) 08/17/2022 0828   HGB 9.6 (L) 08/17/2022 0828   HCT 28.4 (L) 08/17/2022 0828   PLT 213 08/17/2022 0828   MCV 94.0 08/17/2022 0828   MCH 31.8 08/17/2022 0828   MCHC 33.8 08/17/2022 0828   RDW 19.2 (H) 08/17/2022 0828   LYMPHSABS PENDING 08/17/2022 0828   MONOABS PENDING 08/17/2022 0828   EOSABS PENDING 08/17/2022 0828   BASOSABS PENDING 08/17/2022 0828    CMP     Component Value Date/Time   NA 140 08/10/2022 0809   K 3.5 08/10/2022 0809   CL 107 08/10/2022 0809   CO2 26 08/10/2022 0809   GLUCOSE 117 (H) 08/10/2022 0809   BUN 10 08/10/2022 0809   CREATININE 0.63 08/10/2022 0809   CALCIUM 9.0 08/10/2022 0809   PROT 6.7 08/10/2022 0809   ALBUMIN 3.9 08/10/2022 0809   AST 11 (L) 08/10/2022 0809   ALT 10 08/10/2022 0809   ALKPHOS 66 08/10/2022 0809   BILITOT 0.3 08/10/2022 0809   GFRNONAA >60 08/10/2022 0809   GFRAA >60 12/22/2018 2150       ASSESSMENT and THERAPY PLAN:  JAYNIA FENDLEY is a 61 y.o. 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    #  Stage IIb triple negative breast cancer  --Currently receiving neoadjuvant chemotherapy with carbo/taxol and keytruda x 4 cycles followed by adriamycin, cytoxan and keytruda x 4 cycles.  --Due for cycle 4-day 1 today.  We have once again today discussed about chemotherapy.  She will start Adriamycin and cyclophosphamide with Keytruda from her next cycle.  She will continue neoadjuvant chemotherapy until early December followed by surgery and likely adjuvant Keytruda after that.  She understands the plan completely.  So far she had complete clinical response and she has been tolerating the combination very well.  No dose-limiting toxicity reported Return to clinic as scheduled. #Chemotherapy-induced anemia, mild, hemoglobin today at 9.6 g/dL.  No indication for transfusion  All questions were answered. The patient knows to call the clinic with any problems, questions or concerns. We can certainly see the patient much sooner if necessary.  I have spent a total of 30 minutes minutes of face-to-face and non-face-to-face time, preparing to see the patient, performing a medically appropriate examination, counseling and educating the patient, documenting clinical information in the electronic health record, and care coordination.  Benay Pike MD

## 2022-08-17 NOTE — Patient Instructions (Signed)
[facility] MEDICAL ONCOLOGY  Discharge Instructions: Thank you for choosing [facility] to provide your oncology and hematology care.   If you have a lab appointment with the Cancer Center, please go directly to the Cancer Center and check in at the registration area.   Wear comfortable clothing and clothing appropriate for easy access to any Portacath or PICC line.   We strive to give you quality time with your provider. You may need to reschedule your appointment if you arrive late (15 or more minutes).  Arriving late affects you and other patients whose appointments are after yours.  Also, if you miss three or more appointments without notifying the office, you may be dismissed from the clinic at the provider's discretion.      For prescription refill requests, have your pharmacy contact our office and allow 72 hours for refills to be completed.    Today you received the following chemotherapy and/or immunotherapy agents; Keytruda, Paclitaxel, & Carboplatin      To help prevent nausea and vomiting after your treatment, we encourage you to take your nausea medication as directed.  BELOW ARE SYMPTOMS THAT SHOULD BE REPORTED IMMEDIATELY: *FEVER GREATER THAN 100.4 F (38 C) OR HIGHER *CHILLS OR SWEATING *NAUSEA AND VOMITING THAT IS NOT CONTROLLED WITH YOUR NAUSEA MEDICATION *UNUSUAL SHORTNESS OF BREATH *UNUSUAL BRUISING OR BLEEDING *URINARY PROBLEMS (pain or burning when urinating, or frequent urination) *BOWEL PROBLEMS (unusual diarrhea, constipation, pain near the anus) TENDERNESS IN MOUTH AND THROAT WITH OR WITHOUT PRESENCE OF ULCERS (sore throat, sores in mouth, or a toothache) UNUSUAL RASH, SWELLING OR PAIN  UNUSUAL VAGINAL DISCHARGE OR ITCHING   Items with * indicate a potential emergency and should be followed up as soon as possible or go to the Emergency Department if any problems should occur.  Please show the CHEMOTHERAPY ALERT CARD or IMMUNOTHERAPY  ALERT CARD at check-in to the Emergency Department and triage nurse.  Should you have questions after your visit or need to cancel or reschedule your appointment, please contact [facility] MEDICAL ONCOLOGY  Dept: [phone]  and follow the prompts.  Office hours are 8:00 a.m. to 4:30 p.m. Monday - Friday. Please note that voicemails left after 4:00 p.m. may not be returned until the following business day.  We are closed weekends and major holidays. You have access to a nurse at all times for urgent questions. Please call the main number to the clinic Dept: [phone] and follow the prompts.   For any non-urgent questions, you may also contact your provider using MyChart. We now offer e-Visits for anyone 18 and older to request care online for non-urgent symptoms. For details visit [email or web address].   Also download the MyChart app! Go to the app store, search "MyChart", open the app, select [facility], and log in with your MyChart username and password.  Masks are optional in the cancer centers. If you would like for your care team to wear a mask while they are taking care of you, please let them know. You may have one support person who is at least 62 years old accompany you for your appointments. 

## 2022-08-17 NOTE — Patient Instructions (Signed)

## 2022-08-23 MED FILL — Dexamethasone Sodium Phosphate Inj 100 MG/10ML: INTRAMUSCULAR | Qty: 1 | Status: AC

## 2022-08-24 ENCOUNTER — Inpatient Hospital Stay: Payer: BC Managed Care – PPO

## 2022-08-24 ENCOUNTER — Encounter: Payer: Self-pay | Admitting: Hematology and Oncology

## 2022-08-24 ENCOUNTER — Telehealth: Payer: Self-pay | Admitting: *Deleted

## 2022-08-24 ENCOUNTER — Encounter: Payer: Self-pay | Admitting: Physician Assistant

## 2022-08-24 ENCOUNTER — Other Ambulatory Visit: Payer: Self-pay

## 2022-08-24 VITALS — BP 104/66 | HR 94 | Temp 98.2°F | Resp 16 | Wt 204.8 lb

## 2022-08-24 DIAGNOSIS — Z5112 Encounter for antineoplastic immunotherapy: Secondary | ICD-10-CM | POA: Diagnosis not present

## 2022-08-24 DIAGNOSIS — Z171 Estrogen receptor negative status [ER-]: Secondary | ICD-10-CM

## 2022-08-24 DIAGNOSIS — Z95828 Presence of other vascular implants and grafts: Secondary | ICD-10-CM

## 2022-08-24 LAB — CBC WITH DIFFERENTIAL (CANCER CENTER ONLY)
Abs Immature Granulocytes: 0.01 10*3/uL (ref 0.00–0.07)
Basophils Absolute: 0 10*3/uL (ref 0.0–0.1)
Basophils Relative: 1 %
Eosinophils Absolute: 0 10*3/uL (ref 0.0–0.5)
Eosinophils Relative: 0 %
HCT: 25.1 % — ABNORMAL LOW (ref 36.0–46.0)
Hemoglobin: 8.6 g/dL — ABNORMAL LOW (ref 12.0–15.0)
Immature Granulocytes: 0 %
Lymphocytes Relative: 45 %
Lymphs Abs: 1.2 10*3/uL (ref 0.7–4.0)
MCH: 31.6 pg (ref 26.0–34.0)
MCHC: 34.3 g/dL (ref 30.0–36.0)
MCV: 92.3 fL (ref 80.0–100.0)
Monocytes Absolute: 0.2 10*3/uL (ref 0.1–1.0)
Monocytes Relative: 7 %
Neutro Abs: 1.2 10*3/uL — ABNORMAL LOW (ref 1.7–7.7)
Neutrophils Relative %: 47 %
Platelet Count: 178 10*3/uL (ref 150–400)
RBC: 2.72 MIL/uL — ABNORMAL LOW (ref 3.87–5.11)
RDW: 18.6 % — ABNORMAL HIGH (ref 11.5–15.5)
WBC Count: 2.7 10*3/uL — ABNORMAL LOW (ref 4.0–10.5)
nRBC: 0 % (ref 0.0–0.2)

## 2022-08-24 LAB — CMP (CANCER CENTER ONLY)
ALT: 9 U/L (ref 0–44)
AST: 13 U/L — ABNORMAL LOW (ref 15–41)
Albumin: 3.8 g/dL (ref 3.5–5.0)
Alkaline Phosphatase: 64 U/L (ref 38–126)
Anion gap: 7 (ref 5–15)
BUN: 7 mg/dL — ABNORMAL LOW (ref 8–23)
CO2: 26 mmol/L (ref 22–32)
Calcium: 8.6 mg/dL — ABNORMAL LOW (ref 8.9–10.3)
Chloride: 104 mmol/L (ref 98–111)
Creatinine: 0.6 mg/dL (ref 0.44–1.00)
GFR, Estimated: >60 mL/min (ref >60)
Glucose, Bld: 186 mg/dL — ABNORMAL HIGH (ref 70–99)
Potassium: 3.5 mmol/L (ref 3.5–5.1)
Sodium: 137 mmol/L (ref 135–145)
Total Bilirubin: 0.5 mg/dL (ref 0.3–1.2)
Total Protein: 6.3 g/dL — ABNORMAL LOW (ref 6.5–8.1)

## 2022-08-24 LAB — TSH: TSH: 0.818 u[IU]/mL (ref 0.350–4.500)

## 2022-08-24 MED ORDER — SODIUM CHLORIDE 0.9 % IV SOLN: Freq: Once | INTRAVENOUS | Status: AC

## 2022-08-24 MED ORDER — SODIUM CHLORIDE 0.9% FLUSH
10.0000 mL | Freq: Once | INTRAVENOUS | Status: AC
  Administered 2022-08-24: 10 mL

## 2022-08-24 MED ORDER — HEPARIN SOD (PORK) LOCK FLUSH 100 UNIT/ML IV SOLN
500.0000 [IU] | Freq: Once | INTRAVENOUS | Status: AC | PRN
  Administered 2022-08-24: 500 [IU]

## 2022-08-24 MED ORDER — SODIUM CHLORIDE 0.9 % IV SOLN
80.0000 mg/m2 | Freq: Once | INTRAVENOUS | Status: AC
  Administered 2022-08-24: 174 mg via INTRAVENOUS
  Filled 2022-08-24: qty 29

## 2022-08-24 MED ORDER — DIPHENHYDRAMINE HCL 50 MG/ML IJ SOLN
50.0000 mg | Freq: Once | INTRAMUSCULAR | Status: AC
  Administered 2022-08-24: 50 mg via INTRAVENOUS
  Filled 2022-08-24: qty 1

## 2022-08-24 MED ORDER — SODIUM CHLORIDE 0.9% FLUSH
10.0000 mL | INTRAVENOUS | Status: DC | PRN
  Administered 2022-08-24: 10 mL

## 2022-08-24 MED ORDER — SODIUM CHLORIDE 0.9 % IV SOLN
10.0000 mg | Freq: Once | INTRAVENOUS | Status: AC
  Administered 2022-08-24: 10 mg via INTRAVENOUS
  Filled 2022-08-24: qty 10

## 2022-08-24 MED ORDER — FAMOTIDINE IN NACL 20-0.9 MG/50ML-% IV SOLN
20.0000 mg | Freq: Once | INTRAVENOUS | Status: AC
  Administered 2022-08-24: 20 mg via INTRAVENOUS
  Filled 2022-08-24: qty 50

## 2022-08-24 NOTE — Patient Instructions (Signed)
[facility] MEDICAL ONCOLOGY  Discharge Instructions: Thank you for choosing [facility] to provide your oncology and hematology care.   If you have a lab appointment with the Cancer Center, please go directly to the Cancer Center and check in at the registration area.   Wear comfortable clothing and clothing appropriate for easy access to any Portacath or PICC line.   We strive to give you quality time with your provider. You may need to reschedule your appointment if you arrive late (15 or more minutes).  Arriving late affects you and other patients whose appointments are after yours.  Also, if you miss three or more appointments without notifying the office, you may be dismissed from the clinic at the provider's discretion.      For prescription refill requests, have your pharmacy contact our office and allow 72 hours for refills to be completed.    Today you received the following chemotherapy and/or immunotherapy agents: Paclitaxel.       To help prevent nausea and vomiting after your treatment, we encourage you to take your nausea medication as directed.  BELOW ARE SYMPTOMS THAT SHOULD BE REPORTED IMMEDIATELY: *FEVER GREATER THAN 100.4 F (38 C) OR HIGHER *CHILLS OR SWEATING *NAUSEA AND VOMITING THAT IS NOT CONTROLLED WITH YOUR NAUSEA MEDICATION *UNUSUAL SHORTNESS OF BREATH *UNUSUAL BRUISING OR BLEEDING *URINARY PROBLEMS (pain or burning when urinating, or frequent urination) *BOWEL PROBLEMS (unusual diarrhea, constipation, pain near the anus) TENDERNESS IN MOUTH AND THROAT WITH OR WITHOUT PRESENCE OF ULCERS (sore throat, sores in mouth, or a toothache) UNUSUAL RASH, SWELLING OR PAIN  UNUSUAL VAGINAL DISCHARGE OR ITCHING   Items with * indicate a potential emergency and should be followed up as soon as possible or go to the Emergency Department if any problems should occur.  Please show the CHEMOTHERAPY ALERT CARD or IMMUNOTHERAPY ALERT CARD at check-in  to the Emergency Department and triage nurse.  Should you have questions after your visit or need to cancel or reschedule your appointment, please contact [facility] MEDICAL ONCOLOGY  Dept: [phone]  and follow the prompts.  Office hours are 8:00 a.m. to 4:30 p.m. Monday - Friday. Please note that voicemails left after 4:00 p.m. may not be returned until the following business day.  We are closed weekends and major holidays. You have access to a nurse at all times for urgent questions. Please call the main number to the clinic Dept: [phone] and follow the prompts.   For any non-urgent questions, you may also contact your provider using MyChart. We now offer e-Visits for anyone 18 and older to request care online for non-urgent symptoms. For details visit [email or web address].   Also download the MyChart app! Go to the app store, search "MyChart", open the app, select [facility], and log in with your MyChart username and password.  Masks are optional in the cancer centers. If you would like for your care team to wear a mask while they are taking care of you, please let them know. You may have one support person who is at least 62 years old accompany you for your appointments. 

## 2022-08-24 NOTE — Progress Notes (Signed)
Received approval for copay assistance from [employer] for [facility] via fax.  Patient approved for $25,000 08/23/22 leaving patient with a $25 copay for the first eligible date of service. A copy of the approval letter will be mailed to the patient for her records only.  A copy also sent to [facility] for retro claim/submissions.  Patient has my card for any additional financial questions or concerns.

## 2022-08-24 NOTE — Telephone Encounter (Signed)
Ok to treat with anc of 1.2 for d8 of c4  of Taxol/carbo/keytruda per MD review.

## 2022-08-26 LAB — T4: T4, Total: 8.7 ug/dL (ref 4.5–12.0)

## 2022-08-30 ENCOUNTER — Ambulatory Visit: Payer: BC Managed Care – PPO | Admitting: Dietician

## 2022-08-30 MED FILL — Dexamethasone Sodium Phosphate Inj 100 MG/10ML: INTRAMUSCULAR | Qty: 1 | Status: AC

## 2022-08-30 NOTE — Progress Notes (Signed)
Nutrition Assessment: Reached out to patient at home telephone number.    Reason for Assessment: MST screen for weight loss   ASSESSMENT: Patient is 64 YOF with BCA and PMHx that includes DM2 and HTN. Some of her weight loss relater to taste issues some potentially from depression and stress. Mom passed this summer suddenly. Spouse had health issues and inn August had amputation.  She reports she likes healthy foods loves her vegetables.  Liver onions, chicken [place] mostly.  Loves a ribeye but fears trying it right now with the way her taste are right now.  She's not skipping meals brings healthy options with her. Some foods she doesn't like include:    yogurt, not big on eggs, doesn't drink milk except for cereal   Nutrition Focused Physical Exam: unable to perform NFPE  Anthropometrics:  weight loss of 16# (7%) past 2 months  Height: 69" Weight:  08/24/22  204.8# 08/10/22  210.8# 07/12/22  212.5# 06/21/22  220 UBW: 270# at highest BMI: 30.24   Estimated Energy Needs  Kcals: 2300-2800 Protein: 111-140 g Fluid: 2.5 L  INTERVENTION:   Educated on importance of adequate calorie and protein energy intake  with nutrient dense foods when possible to maintain weight/strength  Discussed ways to add calories/protein to foods (adding cheese, cooking with butter, creamy sauces/gravy)   Discussed strategies for increasing iron rich foods and protein Emailed Nutrition Tip sheet  for  Nutrition During Cancer Treatment, anemia, taste changes and plant based protein sources Contact information provided  MONITORING, EVALUATION, GOAL: weight trends, nutrition impact symptoms, PO intake, labs   Next Visit: PRN at patient or provider request  April Manson, RDN, LDN Registered Dietitian, [facility] Part Time Remote (Usual office hours: Tuesday-Thursday) Mobile: [phone] Remote Office: [phone]

## 2022-08-31 ENCOUNTER — Encounter: Payer: Self-pay | Admitting: Adult Health

## 2022-08-31 ENCOUNTER — Inpatient Hospital Stay (HOSPITAL_BASED_OUTPATIENT_CLINIC_OR_DEPARTMENT_OTHER): Payer: BC Managed Care – PPO | Admitting: Adult Health

## 2022-08-31 ENCOUNTER — Encounter: Payer: Self-pay | Admitting: *Deleted

## 2022-08-31 ENCOUNTER — Inpatient Hospital Stay: Payer: BC Managed Care – PPO

## 2022-08-31 ENCOUNTER — Other Ambulatory Visit: Payer: Self-pay

## 2022-08-31 VITALS — BP 129/64 | HR 101 | Temp 97.2°F | Resp 18 | Ht 69.0 in | Wt 207.4 lb

## 2022-08-31 VITALS — HR 89

## 2022-08-31 DIAGNOSIS — Z171 Estrogen receptor negative status [ER-]: Secondary | ICD-10-CM

## 2022-08-31 DIAGNOSIS — Z5112 Encounter for antineoplastic immunotherapy: Secondary | ICD-10-CM | POA: Diagnosis not present

## 2022-08-31 DIAGNOSIS — C50212 Malignant neoplasm of upper-inner quadrant of left female breast: Secondary | ICD-10-CM

## 2022-08-31 DIAGNOSIS — Z95828 Presence of other vascular implants and grafts: Secondary | ICD-10-CM

## 2022-08-31 LAB — CBC WITH DIFFERENTIAL (CANCER CENTER ONLY)
Abs Immature Granulocytes: 0.01 10*3/uL (ref 0.00–0.07)
Basophils Absolute: 0 10*3/uL (ref 0.0–0.1)
Basophils Relative: 0 %
Eosinophils Absolute: 0 10*3/uL (ref 0.0–0.5)
Eosinophils Relative: 1 %
HCT: 23.5 % — ABNORMAL LOW (ref 36.0–46.0)
Hemoglobin: 7.9 g/dL — ABNORMAL LOW (ref 12.0–15.0)
Immature Granulocytes: 0 %
Lymphocytes Relative: 52 %
Lymphs Abs: 1.2 10*3/uL (ref 0.7–4.0)
MCH: 32 pg (ref 26.0–34.0)
MCHC: 33.6 g/dL (ref 30.0–36.0)
MCV: 95.1 fL (ref 80.0–100.0)
Monocytes Absolute: 0.2 10*3/uL (ref 0.1–1.0)
Monocytes Relative: 9 %
Neutro Abs: 0.9 10*3/uL — ABNORMAL LOW (ref 1.7–7.7)
Neutrophils Relative %: 38 %
Platelet Count: 139 10*3/uL — ABNORMAL LOW (ref 150–400)
RBC: 2.47 MIL/uL — ABNORMAL LOW (ref 3.87–5.11)
RDW: 19.9 % — ABNORMAL HIGH (ref 11.5–15.5)
Smear Review: NORMAL
WBC Count: 2.3 10*3/uL — ABNORMAL LOW (ref 4.0–10.5)
nRBC: 0.9 % — ABNORMAL HIGH (ref 0.0–0.2)

## 2022-08-31 LAB — CMP (CANCER CENTER ONLY)
ALT: 10 U/L (ref 0–44)
AST: 13 U/L — ABNORMAL LOW (ref 15–41)
Albumin: 3.8 g/dL (ref 3.5–5.0)
Alkaline Phosphatase: 63 U/L (ref 38–126)
Anion gap: 5 (ref 5–15)
BUN: 6 mg/dL — ABNORMAL LOW (ref 8–23)
CO2: 26 mmol/L (ref 22–32)
Calcium: 8.7 mg/dL — ABNORMAL LOW (ref 8.9–10.3)
Chloride: 109 mmol/L (ref 98–111)
Creatinine: 0.6 mg/dL (ref 0.44–1.00)
GFR, Estimated: >60 mL/min (ref >60)
Glucose, Bld: 121 mg/dL — ABNORMAL HIGH (ref 70–99)
Potassium: 3.2 mmol/L — ABNORMAL LOW (ref 3.5–5.1)
Sodium: 140 mmol/L (ref 135–145)
Total Bilirubin: 0.3 mg/dL (ref 0.3–1.2)
Total Protein: 6.4 g/dL — ABNORMAL LOW (ref 6.5–8.1)

## 2022-08-31 LAB — TSH: TSH: 0.9 u[IU]/mL (ref 0.350–4.500)

## 2022-08-31 MED ORDER — SODIUM CHLORIDE 0.9 % IV SOLN: Freq: Once | INTRAVENOUS | Status: AC

## 2022-08-31 MED ORDER — SODIUM CHLORIDE 0.9 % IV SOLN
10.0000 mg | Freq: Once | INTRAVENOUS | Status: AC
  Administered 2022-08-31: 10 mg via INTRAVENOUS
  Filled 2022-08-31: qty 10

## 2022-08-31 MED ORDER — SODIUM CHLORIDE 0.9 % IV SOLN
80.0000 mg/m2 | Freq: Once | INTRAVENOUS | Status: AC
  Administered 2022-08-31: 174 mg via INTRAVENOUS
  Filled 2022-08-31: qty 29

## 2022-08-31 MED ORDER — DIPHENHYDRAMINE HCL 50 MG/ML IJ SOLN
50.0000 mg | Freq: Once | INTRAMUSCULAR | Status: AC
  Administered 2022-08-31: 50 mg via INTRAVENOUS
  Filled 2022-08-31: qty 1

## 2022-08-31 MED ORDER — SODIUM CHLORIDE 0.9% FLUSH
10.0000 mL | INTRAVENOUS | Status: DC | PRN
  Administered 2022-08-31: 10 mL

## 2022-08-31 MED ORDER — SODIUM CHLORIDE 0.9% FLUSH
10.0000 mL | Freq: Once | INTRAVENOUS | Status: AC
  Administered 2022-08-31: 10 mL

## 2022-08-31 MED ORDER — FAMOTIDINE IN NACL 20-0.9 MG/50ML-% IV SOLN
20.0000 mg | Freq: Once | INTRAVENOUS | Status: AC
  Administered 2022-08-31: 20 mg via INTRAVENOUS
  Filled 2022-08-31: qty 50

## 2022-08-31 MED ORDER — HEPARIN SOD (PORK) LOCK FLUSH 100 UNIT/ML IV SOLN
500.0000 [IU] | Freq: Once | INTRAVENOUS | Status: AC | PRN
  Administered 2022-08-31: 500 [IU]

## 2022-08-31 NOTE — Assessment & Plan Note (Addendum)
Sydney Lee is a 61 year old woman with history of stage IIb triple negative breast cancer diagnosed in June 2023 currently receiving neoadjuvant chemotherapy.  Her treatment regimen consists of pembrolizumab given on day 1 of a 21-day cycle along with Taxol and carbo given on days 1 8 and 15 of a 21-day cycle.  Today she is due for Taxol carbo alone.  She will complete this part of her chemotherapy regimen today and will go on to receive Adriamycin, Cytoxan, and pembrolizumab given on days 1 of a 21-day cycle.  Follow will proceed with treatment today.  Her ANC is 0.9 which is slightly decreased, however she is going to receive Neupogen or Neupogen bio similar on September 28, September 29, and September 30 to support her white blood cells.  Her potassium level is slightly decreased at 3.2.  We discussed potassium rich foods such as bananas, sweet potatoes, and melons.  She already has bananas and sweet potatoes at home and tells me that she is going to work on getting more of these and on a daily basis.  Her hemoglobin is 7.9.  She is eating iron rich foods.  This is likely secondary to the carboplatin she is receiving.  She is not to the point where she needs a blood transfusion, however I did tell her that should the hemoglobin decline further we may need to give her blood transfusion in the future.  She verbalized understanding of this plan and is in agreement with it if needed.  She does not have any more appointments scheduled.  I asked our schedulers to go ahead and look at her integrated scheduling guidelines in schedule some further appointments.  We will see Sydney Lee back next week.  She knows to call for any questions or concerns that may arise between now and her next appointment

## 2022-08-31 NOTE — Patient Instructions (Signed)
[facility] MEDICAL ONCOLOGY   Discharge Instructions: Thank you for choosing [facility] to provide your oncology and hematology care.   If you have a lab appointment with the Cancer Center, please go directly to the Cancer Center and check in at the registration area.   Wear comfortable clothing and clothing appropriate for easy access to any Portacath or PICC line.   We strive to give you quality time with your provider. You may need to reschedule your appointment if you arrive late (15 or more minutes).  Arriving late affects you and other patients whose appointments are after yours.  Also, if you miss three or more appointments without notifying the office, you may be dismissed from the clinic at the provider's discretion.      For prescription refill requests, have your pharmacy contact our office and allow 72 hours for refills to be completed.    Today you received the following chemotherapy and/or immunotherapy agents: paclitaxel      To help prevent nausea and vomiting after your treatment, we encourage you to take your nausea medication as directed.  BELOW ARE SYMPTOMS THAT SHOULD BE REPORTED IMMEDIATELY: *FEVER GREATER THAN 100.4 F (38 C) OR HIGHER *CHILLS OR SWEATING *NAUSEA AND VOMITING THAT IS NOT CONTROLLED WITH YOUR NAUSEA MEDICATION *UNUSUAL SHORTNESS OF BREATH *UNUSUAL BRUISING OR BLEEDING *URINARY PROBLEMS (pain or burning when urinating, or frequent urination) *BOWEL PROBLEMS (unusual diarrhea, constipation, pain near the anus) TENDERNESS IN MOUTH AND THROAT WITH OR WITHOUT PRESENCE OF ULCERS (sore throat, sores in mouth, or a toothache) UNUSUAL RASH, SWELLING OR PAIN  UNUSUAL VAGINAL DISCHARGE OR ITCHING   Items with * indicate a potential emergency and should be followed up as soon as possible or go to the Emergency Department if any problems should occur.  Please show the CHEMOTHERAPY ALERT CARD or IMMUNOTHERAPY ALERT CARD at check-in  to the Emergency Department and triage nurse.  Should you have questions after your visit or need to cancel or reschedule your appointment, please contact [facility]  Dept: [phone]  and follow the prompts.  Office hours are 8:00 a.m. to 4:30 p.m. Monday - Friday. Please note that voicemails left after 4:00 p.m. may not be returned until the following business day.  We are closed weekends and major holidays. You have access to a nurse at all times for urgent questions. Please call the main number to the clinic Dept: [phone] and follow the prompts.   For any non-urgent questions, you may also contact your provider using MyChart. We now offer e-Visits for anyone 18 and older to request care online for non-urgent symptoms. For details visit [email or web address].   Also download the MyChart app! Go to the app store, search "MyChart", open the app, select [facility], and log in with your MyChart username and password.  Masks are optional in the cancer centers. If you would like for your care team to wear a mask while they are taking care of you, please let them know. You may have one support person who is at least 62 years old accompany you for your appointments. 

## 2022-08-31 NOTE — Progress Notes (Signed)
[facility]:    Sydney Lee, [address] [address]   DIAGNOSIS:  Cancer Staging  Malignant neoplasm of upper-inner quadrant of left breast in female, estrogen receptor negative ([place]) Staging form: Breast, AJCC 8th Edition - Clinical: Stage IIB (cT2, cN0, cM0, G3, ER-, PR-, HER2-) - Signed by Benay Pike, MD on 05/25/2022 Stage prefix: Initial diagnosis Histologic grading system: 3 grade system   SUMMARY OF ONCOLOGIC HISTORY: Oncology History  Malignant neoplasm of upper-inner quadrant of left breast in female, estrogen receptor negative ([place])  04/27/2022 Mammogram   Diagnostic mammogram showed indeterminate solid mass in the 10:00 location of the left breast.  Small satellite nodule adjacent to the index mass.  Borderline lymph node has cortical thickening of 2.9 mm.  Other lymph nodes have normal morphology.   05/16/2022 Pathology Results   Left breast needle core biopsy showed invasive poorly differentiated adenocarcinoma, grade 3 with tumor necrosis, lymph node biopsy benign reactive, negative for carcinoma.  Prognostics from the tumor showed ER 0%, negative, PR 0%, negative, Ki-67 of 80% and HER2 negative   05/23/2022 Initial Diagnosis   Malignant neoplasm of upper-inner quadrant of left breast in female, estrogen receptor negative ([place])   05/25/2022 Cancer Staging   Staging form: Breast, AJCC 8th Edition - Clinical: Stage IIB (cT2, cN0, cM0, G3, ER-, PR-, HER2-) - Signed by Benay Pike, MD on 05/25/2022 Stage prefix: Initial diagnosis Histologic grading system: 3 grade system   05/25/2022 Genetic Testing   [facility] CancerNext-Expanded Panel was Negative. Report date was 06/02/2022.  The CancerNext-Expanded gene panel offered by [facility] and includes sequencing, rearrangement, and RNA analysis for the following 77 genes: AIP, ALK, APC, ATM, AXIN2, BAP1, BARD1, BLM, BMPR1A, BRCA1, BRCA2, BRIP1, CDC73, CDH1, CDK4, CDKN1B,  CDKN2A, CHEK2, CTNNA1, DICER1, FANCC, FH, FLCN, GALNT12, KIF1B, LZTR1, MAX, MEN1, MET, MLH1, MSH2, MSH3, MSH6, MUTYH, NBN, NF1, NF2, NTHL1, PALB2, PHOX2B, PMS2, POT1, PRKAR1A, PTCH1, PTEN, RAD51C, RAD51D, RB1, RECQL, RET, SDHA, SDHAF2, SDHB, SDHC, SDHD, SMAD4, SMARCA4, SMARCB1, SMARCE1, STK11, SUFU, TMEM127, TP53, TSC1, TSC2, VHL and XRCC2 (sequencing and deletion/duplication); EGFR, EGLN1, HOXB13, KIT, MITF, PDGFRA, POLD1, and POLE (sequencing only); EPCAM and GREM1 (deletion/duplication only).    06/02/2022 Initial Biopsy   Additional left axillary node biopsy   06/06/2022 - 08/02/2022 Chemotherapy   Patient is on Treatment Plan : BREAST Pembrolizumab (200) D1 + Carboplatin (5) D1 + Paclitaxel (80) D1,8,15 q21d X 4 cycles / Pembrolizumab (200) D1 + AC D1 q21d x 4 cycles     06/06/2022 -  Chemotherapy   Patient is on Treatment Plan : BREAST Pembrolizumab (200) D1 + Carboplatin (5) D1 + Paclitaxel (80) D1,8,15 q21d X 4 cycles / Pembrolizumab (200) D1 + AC D1 q21d x 4 cycles       CURRENT THERAPY: Taxol/Carbo/Keytruda  INTERVAL HISTORY: Sydney Lee 61 y.o. female returns for follow-up and her final weekly Taxol carbo.  She is tolerating the treatment well.  She has intermittent neuropathy in her fingertips and toes but none today.  Sydney Lee has had some fatigue however she notes that her husband had an amputation and just came home a couple weeks ago from rehab and her mother passed 2 months ago when her dad passed this morning.  She has had an understandable amount of psychosocial stress to deal with.  She continues to work and is performing all of her activities of daily living.  She can no longer feel the tumor  that was previously palpable in her left breast.   Patient Active Problem List   Diagnosis Date Noted   Port-A-Cath in place 06/06/2022   Infusion reaction 06/06/2022   Genetic testing 06/03/2022   Family history of breast cancer 05/25/2022   Family history of ovarian cancer  05/25/2022   Malignant neoplasm of upper-inner quadrant of left breast in female, estrogen receptor negative ([place]) 05/23/2022    is allergic to [employer] [aprepitant], codeine, and latex.  MEDICAL HISTORY: Past Medical History:  Diagnosis Date   Anxiety    Arthritis    Breast cancer ([place])    Depression    Diabetes mellitus without complication ([place])    Hypertension     SURGICAL HISTORY: Past Surgical History:  Procedure Laterality Date   PORTACATH PLACEMENT N/A 06/01/2022   Procedure: INSERTION PORT-A-CATH WITH ULTRASOUND GUIDANCE;  Surgeon: Stark Klein, MD;  Location: [facility];  Service: General;  Laterality: N/A;    SOCIAL HISTORY: Social History   Socioeconomic History   Marital status: Married    Spouse name: Not on file   Number of children: Not on file   Years of education: Not on file   Highest education level: Not on file  Occupational History   Not on file  Tobacco Use   Smoking status: Former    Packs/day: 1.00    Years: 40.00    Total pack years: 40.00    Types: Cigarettes    Quit date: 06/05/2022    Years since quitting: 0.2   Smokeless tobacco: Never  Substance and Sexual Activity   Alcohol use: No   Drug use: No   Sexual activity: Not on file  Other Topics Concern   Not on file  Social History Narrative   Not on file   Social Determinants of Health   Financial Resource Strain: High Risk (06/14/2022)   Overall Financial Resource Strain (CARDIA)    Difficulty of Paying Living Expenses: Hard  Food Insecurity: Food Insecurity Present (06/14/2022)   Hunger Vital Sign    Worried About Running Out of Food in the Last Year: Sometimes true    Ran Out of Food in the Last Year: Never true  Transportation Needs: No Transportation Needs (06/14/2022)   PRAPARE - Hydrologist (Medical): No    Lack of Transportation (Non-Medical): No  Physical Activity: Not on file  Stress: Not on file  Social Connections: Not on file  Intimate  Partner Violence: Not on file    FAMILY HISTORY: Family History  Problem Relation Age of Onset   Leukemia Mother 45   Throat cancer Maternal Uncle 85   Prostate cancer Maternal Uncle    Breast cancer Cousin 96       maternal first cousin   Ovarian cancer Cousin        maternal first cousin   Breast cancer Cousin 54 - 54       paternal first cousin   Breast cancer Cousin 58 - 24       paternal first cousin    Review of Systems  Constitutional:  Positive for fatigue. Negative for appetite change, chills, fever and unexpected weight change.  HENT:   Negative for hearing loss, lump/mass and trouble swallowing.   Eyes:  Negative for eye problems and icterus.  Respiratory:  Negative for chest tightness, cough and shortness of breath.   Cardiovascular:  Negative for chest pain, leg swelling and palpitations.  Gastrointestinal:  Negative for abdominal distention, abdominal pain,  constipation, diarrhea, nausea and vomiting.  Endocrine: Negative for hot flashes.  Genitourinary:  Negative for difficulty urinating.   Musculoskeletal:  Negative for arthralgias.  Skin:  Negative for itching and rash.  Neurological:  Negative for dizziness, extremity weakness, headaches and numbness.  Hematological:  Negative for adenopathy. Does not bruise/bleed easily.  Psychiatric/Behavioral:  Negative for depression. The patient is not nervous/anxious.       PHYSICAL EXAMINATION  ECOG PERFORMANCE STATUS: 1 - Symptomatic but completely ambulatory  Vitals:   08/31/22 0818  BP: 129/64  Pulse: (!) 101  Resp: 18  Temp: (!) 97.2 F (36.2 C)  SpO2: 98%    Physical Exam Constitutional:      General: She is not in acute distress.    Appearance: Normal appearance. She is not toxic-appearing.  HENT:     Head: Normocephalic and atraumatic.  Eyes:     General: No scleral icterus. Cardiovascular:     Rate and Rhythm: Normal rate and regular rhythm.     Pulses: Normal pulses.     Heart sounds:  Normal heart sounds.  Pulmonary:     Effort: Pulmonary effort is normal.     Breath sounds: Normal breath sounds.  Abdominal:     General: Abdomen is flat. Bowel sounds are normal. There is no distension.     Palpations: Abdomen is soft.     Tenderness: There is no abdominal tenderness.  Musculoskeletal:        General: No swelling.     Cervical back: Neck supple.  Lymphadenopathy:     Cervical: No cervical adenopathy.  Skin:    General: Skin is warm and dry.     Findings: No rash.  Neurological:     General: No focal deficit present.     Mental Status: She is alert.  Psychiatric:        Mood and Affect: Mood normal.        Behavior: Behavior normal.     LABORATORY DATA:  CBC    Component Value Date/Time   WBC 2.3 (L) 08/31/2022 0759   WBC 4.8 12/22/2018 2150   RBC 2.47 (L) 08/31/2022 0759   HGB 7.9 (L) 08/31/2022 0759   HCT 23.5 (L) 08/31/2022 0759   PLT 139 (L) 08/31/2022 0759   MCV 95.1 08/31/2022 0759   MCH 32.0 08/31/2022 0759   MCHC 33.6 08/31/2022 0759   RDW 19.9 (H) 08/31/2022 0759   LYMPHSABS 1.2 08/31/2022 0759   MONOABS 0.2 08/31/2022 0759   EOSABS 0.0 08/31/2022 0759   BASOSABS 0.0 08/31/2022 0759    CMP     Component Value Date/Time   NA 140 08/31/2022 0759   K 3.2 (L) 08/31/2022 0759   CL 109 08/31/2022 0759   CO2 26 08/31/2022 0759   GLUCOSE 121 (H) 08/31/2022 0759   BUN 6 (L) 08/31/2022 0759   CREATININE 0.60 08/31/2022 0759   CALCIUM 8.7 (L) 08/31/2022 0759   PROT 6.4 (L) 08/31/2022 0759   ALBUMIN 3.8 08/31/2022 0759   AST 13 (L) 08/31/2022 0759   ALT 10 08/31/2022 0759   ALKPHOS 63 08/31/2022 0759   BILITOT 0.3 08/31/2022 0759   GFRNONAA >60 08/31/2022 0759   GFRAA >60 12/22/2018 2150       ASSESSMENT and THERAPY PLAN:   Malignant neoplasm of upper-inner quadrant of left breast in female, estrogen receptor negative ([place]) Sydney Lee is a 61 year old woman with history of stage IIb triple negative breast cancer diagnosed in  June 2023 currently receiving  neoadjuvant chemotherapy.  Her treatment regimen consists of pembrolizumab given on day 1 of a 21-day cycle along with Taxol and carbo given on days 1 8 and 15 of a 21-day cycle.  Today she is due for Taxol carbo alone.  She will complete this part of her chemotherapy regimen today and will go on to receive Adriamycin, Cytoxan, and pembrolizumab given on days 1 of a 21-day cycle.  Follow will proceed with treatment today.  Her ANC is 0.9 which is slightly decreased, however she is going to receive Neupogen or Neupogen bio similar on September 28, September 29, and September 30 to support her white blood cells.  Her potassium level is slightly decreased at 3.2.  We discussed potassium rich foods such as bananas, sweet potatoes, and melons.  She already has bananas and sweet potatoes at home and tells me that she is going to work on getting more of these and on a daily basis.  Her hemoglobin is 7.9.  She is eating iron rich foods.  This is likely secondary to the carboplatin she is receiving.  She is not to the point where she needs a blood transfusion, however I did tell her that should the hemoglobin decline further we may need to give her blood transfusion in the future.  She verbalized understanding of this plan and is in agreement with it if needed.  She does not have any more appointments scheduled.  I asked our schedulers to go ahead and look at her integrated scheduling guidelines in schedule some further appointments.  We will see Sydney Lee back next week.  She knows to call for any questions or concerns that may arise between now and her next appointment    All questions were answered. The patient knows to call the clinic with any problems, questions or concerns. We can certainly see the patient much sooner if necessary.  Total encounter time:40 minutes*in face-to-face visit time, chart review, lab review, care coordination, order entry, and documentation of the  encounter time.    Wilber Bihari, NP 08/31/22 9:06 AM Medical Oncology and Hematology [facility] [place], [place] [address] Tel. [phone]    Fax. [phone]  *Total Encounter Time as defined by the Centers for Medicare and Medicaid Services includes, in addition to the face-to-face time of a patient visit (documented in the note above) non-face-to-face time: obtaining and reviewing outside history, ordering and reviewing medications, tests or procedures, care coordination (communications with other health care professionals or caregivers) and documentation in the medical record.

## 2022-09-01 ENCOUNTER — Inpatient Hospital Stay: Payer: BC Managed Care – PPO

## 2022-09-01 ENCOUNTER — Ambulatory Visit: Payer: BC Managed Care – PPO

## 2022-09-01 ENCOUNTER — Other Ambulatory Visit: Payer: Self-pay

## 2022-09-01 VITALS — BP 114/74 | HR 105 | Temp 98.2°F | Resp 18

## 2022-09-01 DIAGNOSIS — Z5112 Encounter for antineoplastic immunotherapy: Secondary | ICD-10-CM | POA: Diagnosis not present

## 2022-09-01 DIAGNOSIS — Z171 Estrogen receptor negative status [ER-]: Secondary | ICD-10-CM

## 2022-09-01 MED ORDER — FILGRASTIM-AAFI 480 MCG/0.8ML IJ SOSY
480.0000 ug | PREFILLED_SYRINGE | Freq: Once | INTRAMUSCULAR | Status: AC
  Administered 2022-09-01: 480 ug via SUBCUTANEOUS
  Filled 2022-09-01: qty 0.8

## 2022-09-02 ENCOUNTER — Ambulatory Visit: Payer: BC Managed Care – PPO

## 2022-09-02 ENCOUNTER — Inpatient Hospital Stay: Payer: BC Managed Care – PPO

## 2022-09-02 ENCOUNTER — Other Ambulatory Visit: Payer: Self-pay

## 2022-09-02 VITALS — BP 131/72 | HR 112 | Temp 98.8°F | Resp 20

## 2022-09-02 DIAGNOSIS — Z5112 Encounter for antineoplastic immunotherapy: Secondary | ICD-10-CM | POA: Diagnosis not present

## 2022-09-02 DIAGNOSIS — Z171 Estrogen receptor negative status [ER-]: Secondary | ICD-10-CM

## 2022-09-02 LAB — T4: T4, Total: 7.9 ug/dL (ref 4.5–12.0)

## 2022-09-02 MED ORDER — FILGRASTIM-AAFI 480 MCG/0.8ML IJ SOSY
480.0000 ug | PREFILLED_SYRINGE | Freq: Once | INTRAMUSCULAR | Status: AC
  Administered 2022-09-02: 480 ug via SUBCUTANEOUS
  Filled 2022-09-02: qty 0.8

## 2022-09-03 ENCOUNTER — Inpatient Hospital Stay: Payer: BC Managed Care – PPO

## 2022-09-03 ENCOUNTER — Other Ambulatory Visit: Payer: Self-pay

## 2022-09-03 VITALS — BP 128/77 | HR 109 | Temp 97.8°F | Resp 18 | Ht 69.0 in

## 2022-09-03 DIAGNOSIS — Z5112 Encounter for antineoplastic immunotherapy: Secondary | ICD-10-CM | POA: Diagnosis not present

## 2022-09-03 DIAGNOSIS — Z171 Estrogen receptor negative status [ER-]: Secondary | ICD-10-CM

## 2022-09-03 MED ORDER — FILGRASTIM-AAFI 480 MCG/0.8ML IJ SOSY
480.0000 ug | PREFILLED_SYRINGE | Freq: Once | INTRAMUSCULAR | Status: AC
  Administered 2022-09-03: 480 ug via SUBCUTANEOUS

## 2022-09-07 ENCOUNTER — Inpatient Hospital Stay (HOSPITAL_BASED_OUTPATIENT_CLINIC_OR_DEPARTMENT_OTHER): Payer: BC Managed Care – PPO | Admitting: Physician Assistant

## 2022-09-07 ENCOUNTER — Other Ambulatory Visit: Payer: Self-pay

## 2022-09-07 ENCOUNTER — Inpatient Hospital Stay: Payer: BC Managed Care – PPO

## 2022-09-07 ENCOUNTER — Encounter: Payer: Self-pay | Admitting: *Deleted

## 2022-09-07 ENCOUNTER — Other Ambulatory Visit: Payer: Self-pay | Admitting: Hematology and Oncology

## 2022-09-07 ENCOUNTER — Inpatient Hospital Stay: Payer: BC Managed Care – PPO | Attending: Physician Assistant

## 2022-09-07 VITALS — BP 131/78 | HR 97 | Temp 98.3°F | Resp 16

## 2022-09-07 DIAGNOSIS — Z171 Estrogen receptor negative status [ER-]: Secondary | ICD-10-CM

## 2022-09-07 DIAGNOSIS — C50212 Malignant neoplasm of upper-inner quadrant of left female breast: Secondary | ICD-10-CM

## 2022-09-07 DIAGNOSIS — Z79899 Other long term (current) drug therapy: Secondary | ICD-10-CM | POA: Diagnosis not present

## 2022-09-07 DIAGNOSIS — Z5111 Encounter for antineoplastic chemotherapy: Secondary | ICD-10-CM | POA: Insufficient documentation

## 2022-09-07 DIAGNOSIS — G629 Polyneuropathy, unspecified: Secondary | ICD-10-CM | POA: Insufficient documentation

## 2022-09-07 DIAGNOSIS — Z95828 Presence of other vascular implants and grafts: Secondary | ICD-10-CM

## 2022-09-07 DIAGNOSIS — R234 Changes in skin texture: Secondary | ICD-10-CM | POA: Diagnosis not present

## 2022-09-07 DIAGNOSIS — F4321 Adjustment disorder with depressed mood: Secondary | ICD-10-CM | POA: Diagnosis not present

## 2022-09-07 DIAGNOSIS — R21 Rash and other nonspecific skin eruption: Secondary | ICD-10-CM | POA: Insufficient documentation

## 2022-09-07 DIAGNOSIS — Z5112 Encounter for antineoplastic immunotherapy: Secondary | ICD-10-CM | POA: Diagnosis present

## 2022-09-07 LAB — CBC WITH DIFFERENTIAL (CANCER CENTER ONLY)
Abs Immature Granulocytes: 0.08 10*3/uL — ABNORMAL HIGH (ref 0.00–0.07)
Basophils Absolute: 0 10*3/uL (ref 0.0–0.1)
Basophils Relative: 0 %
Eosinophils Absolute: 0 10*3/uL (ref 0.0–0.5)
Eosinophils Relative: 0 %
HCT: 26.8 % — ABNORMAL LOW (ref 36.0–46.0)
Hemoglobin: 9 g/dL — ABNORMAL LOW (ref 12.0–15.0)
Immature Granulocytes: 2 %
Lymphocytes Relative: 26 %
Lymphs Abs: 1.3 10*3/uL (ref 0.7–4.0)
MCH: 32.6 pg (ref 26.0–34.0)
MCHC: 33.6 g/dL (ref 30.0–36.0)
MCV: 97.1 fL (ref 80.0–100.0)
Monocytes Absolute: 0.4 10*3/uL (ref 0.1–1.0)
Monocytes Relative: 9 %
Neutro Abs: 3 10*3/uL (ref 1.7–7.7)
Neutrophils Relative %: 63 %
Platelet Count: 107 10*3/uL — ABNORMAL LOW (ref 150–400)
RBC: 2.76 MIL/uL — ABNORMAL LOW (ref 3.87–5.11)
RDW: 21 % — ABNORMAL HIGH (ref 11.5–15.5)
WBC Count: 4.8 10*3/uL (ref 4.0–10.5)
nRBC: 0.4 % — ABNORMAL HIGH (ref 0.0–0.2)

## 2022-09-07 LAB — TSH: TSH: 0.989 u[IU]/mL (ref 0.350–4.500)

## 2022-09-07 LAB — CMP (CANCER CENTER ONLY)
ALT: 9 U/L (ref 0–44)
AST: 11 U/L — ABNORMAL LOW (ref 15–41)
Albumin: 3.7 g/dL (ref 3.5–5.0)
Alkaline Phosphatase: 69 U/L (ref 38–126)
Anion gap: 4 — ABNORMAL LOW (ref 5–15)
BUN: 12 mg/dL (ref 8–23)
CO2: 29 mmol/L (ref 22–32)
Calcium: 8.6 mg/dL — ABNORMAL LOW (ref 8.9–10.3)
Chloride: 105 mmol/L (ref 98–111)
Creatinine: 0.54 mg/dL (ref 0.44–1.00)
GFR, Estimated: >60 mL/min (ref >60)
Glucose, Bld: 162 mg/dL — ABNORMAL HIGH (ref 70–99)
Potassium: 3.5 mmol/L (ref 3.5–5.1)
Sodium: 138 mmol/L (ref 135–145)
Total Bilirubin: 0.3 mg/dL (ref 0.3–1.2)
Total Protein: 5.8 g/dL — ABNORMAL LOW (ref 6.5–8.1)

## 2022-09-07 MED ORDER — HEPARIN SOD (PORK) LOCK FLUSH 100 UNIT/ML IV SOLN: 500.0000 [IU] | Freq: Once | INTRAVENOUS | Status: DC | PRN

## 2022-09-07 MED ORDER — SODIUM CHLORIDE 0.9 % IV SOLN
200.0000 mg | Freq: Once | INTRAVENOUS | Status: AC
  Administered 2022-09-07: 200 mg via INTRAVENOUS
  Filled 2022-09-07: qty 200

## 2022-09-07 MED ORDER — SODIUM CHLORIDE 0.9% FLUSH
10.0000 mL | Freq: Once | INTRAVENOUS | Status: AC
  Administered 2022-09-07: 10 mL

## 2022-09-07 MED ORDER — SODIUM CHLORIDE 0.9 % IV SOLN: Freq: Once | INTRAVENOUS | Status: AC

## 2022-09-07 MED ORDER — DOXORUBICIN HCL CHEMO IV INJECTION 2 MG/ML
60.0000 mg/m2 | Freq: Once | INTRAVENOUS | Status: AC
  Administered 2022-09-07: 130 mg via INTRAVENOUS
  Filled 2022-09-07: qty 65

## 2022-09-07 MED ORDER — SODIUM CHLORIDE 0.9 % IV SOLN
10.0000 mg | Freq: Once | INTRAVENOUS | Status: AC
  Administered 2022-09-07: 10 mg via INTRAVENOUS
  Filled 2022-09-07: qty 10

## 2022-09-07 MED ORDER — SODIUM CHLORIDE 0.9% FLUSH: 10.0000 mL | INTRAVENOUS | Status: DC | PRN

## 2022-09-07 MED ORDER — SODIUM CHLORIDE 0.9 % IV SOLN
600.0000 mg/m2 | Freq: Once | INTRAVENOUS | Status: AC
  Administered 2022-09-07: 1300 mg via INTRAVENOUS
  Filled 2022-09-07: qty 65

## 2022-09-07 MED ORDER — PALONOSETRON HCL INJECTION 0.25 MG/5ML
0.2500 mg | Freq: Once | INTRAVENOUS | Status: AC
  Administered 2022-09-07: 0.25 mg via INTRAVENOUS
  Filled 2022-09-07: qty 5

## 2022-09-07 NOTE — Progress Notes (Signed)
Symptom Management Consult note [place]    Patient Care Team: [name], MD as PCP - General (Family Medicine) Mauro Kaufmann, RN as Oncology Nurse Navigator Rockwell Germany, RN as Oncology Nurse Navigator Stark Klein, MD as Consulting Physician (General Surgery) Benay Pike, MD as Consulting Physician (Hematology and Oncology) Gery Pray, MD as Consulting Physician (Radiation Oncology)    Name of the patient: Sydney Lee  [id number]  12-16-1960   Date of visit: 09/07/2022   Chief Complaint/Reason for visit: rash   Current Therapy: Finished carboplatin (9/13) and taxol (9/27), continuing Keytruda and starting adriamycin and cytoxan today     ASSESSMENT & PLAN: Patient is a 61 y.o. female  with oncologic history of Malignant neoplasm of upper-inner quadrant of left breast in female, estrogen receptor negative followed by Dr. Chryl Heck.  I have viewed most recent oncology note and lab work.    #) Malignant neoplasm of upper-inner quadrant of left breast in female, estrogen receptor negative - Next appointment with oncologist is 09/28/22   #)Skin peeling -Skin peeling on bilateral hands, dorsum only. No associated other associated symptoms. Worsened after using new lotion. Exam is consistent with contact dermatitis. No signs of secondary bacterial infection. No exam findings or history to suggest SJS or TEN or hand-foot-syndrome. -Patient already discontinued new lotion. Encouraged her to continue to monitor rash at home and contact us if it worsens. At this time it does not appear to be adverse reaction to [place]. -Discussed case with Dr. Chryl Heck and she is agreeable with plan.    Heme/Onc History: Oncology History  Malignant neoplasm of upper-inner quadrant of left breast in female, estrogen receptor negative ([place])  04/27/2022 Mammogram   Diagnostic mammogram showed indeterminate solid mass in the 10:00 location of the left breast.  Small  satellite nodule adjacent to the index mass.  Borderline lymph node has cortical thickening of 2.9 mm.  Other lymph nodes have normal morphology.   05/16/2022 Pathology Results   Left breast needle core biopsy showed invasive poorly differentiated adenocarcinoma, grade 3 with tumor necrosis, lymph node biopsy benign reactive, negative for carcinoma.  Prognostics from the tumor showed ER 0%, negative, PR 0%, negative, Ki-67 of 80% and HER2 negative   05/23/2022 Initial Diagnosis   Malignant neoplasm of upper-inner quadrant of left breast in female, estrogen receptor negative ([place])   05/25/2022 Cancer Staging   Staging form: Breast, AJCC 8th Edition - Clinical: Stage IIB (cT2, cN0, cM0, G3, ER-, PR-, HER2-) - Signed by Benay Pike, MD on 05/25/2022 Stage prefix: Initial diagnosis Histologic grading system: 3 grade system   05/25/2022 Genetic Testing   [facility] CancerNext-Expanded Panel was Negative. Report date was 06/02/2022.  The CancerNext-Expanded gene panel offered by [facility] and includes sequencing, rearrangement, and RNA analysis for the following 77 genes: AIP, ALK, APC, ATM, AXIN2, BAP1, BARD1, BLM, BMPR1A, BRCA1, BRCA2, BRIP1, CDC73, CDH1, CDK4, CDKN1B, CDKN2A, CHEK2, CTNNA1, DICER1, FANCC, FH, FLCN, GALNT12, KIF1B, LZTR1, MAX, MEN1, MET, MLH1, MSH2, MSH3, MSH6, MUTYH, NBN, NF1, NF2, NTHL1, PALB2, PHOX2B, PMS2, POT1, PRKAR1A, PTCH1, PTEN, RAD51C, RAD51D, RB1, RECQL, RET, SDHA, SDHAF2, SDHB, SDHC, SDHD, SMAD4, SMARCA4, SMARCB1, SMARCE1, STK11, SUFU, TMEM127, TP53, TSC1, TSC2, VHL and XRCC2 (sequencing and deletion/duplication); EGFR, EGLN1, HOXB13, KIT, MITF, PDGFRA, POLD1, and POLE (sequencing only); EPCAM and GREM1 (deletion/duplication only).    06/02/2022 Initial Biopsy   Additional left axillary node biopsy   06/06/2022 - 08/02/2022 Chemotherapy   Patient is on Treatment Plan :  BREAST Pembrolizumab (200) D1 + Carboplatin (5) D1 + Paclitaxel (80) D1,8,15 q21d X 4 cycles /  Pembrolizumab (200) D1 + AC D1 q21d x 4 cycles     06/06/2022 -  Chemotherapy   Patient is on Treatment Plan : BREAST Pembrolizumab (200) D1 + Carboplatin (5) D1 + Paclitaxel (80) D1,8,15 q21d X 4 cycles / Pembrolizumab (200) D1 + AC D1 q21d x 4 cycles         Interval history-: Sydney Lee is a 61 y.o. female with oncologic history as above presenting to [facility] today with chief complaint of bilateral hand peeling x 1 week. It started as a small area of dry skin on her right hand. She applied the lotion recommended to her during chemo education however does not remember the name of it. She states the peeling was then noticeable on both hands and progressively worsened. She has since stopped using that lotion. She denies any associated pain or itching. She denies any recent sun exposure, new medication or antibiotic, or contact with an allergen. She does admit to being under significant stress over the last month. She has numbness in her left pinky that has been ongoing prior to starting treatment.       ROS  All other systems are reviewed and are negative for acute change except as noted in the HPI.    Allergies  Allergen Reactions   Emend [Aprepitant] Shortness Of Breath    Flushing and shortness of breath    Codeine Nausea Only and Other (See Comments)    "Sick on the stomach"   Latex Other (See Comments)    Gloves=itching/burning     Past Medical History:  Diagnosis Date   Anxiety    Arthritis    Breast cancer ([place])    Depression    Diabetes mellitus without complication ([place])    Hypertension      Past Surgical History:  Procedure Laterality Date   PORTACATH PLACEMENT N/A 06/01/2022   Procedure: INSERTION PORT-A-CATH WITH ULTRASOUND GUIDANCE;  Surgeon: Stark Klein, MD;  Location: [facility];  Service: General;  Laterality: N/A;    Social History   Socioeconomic History   Marital status: Married    Spouse name: Not on file   Number of children: Not on file   Years  of education: Not on file   Highest education level: Not on file  Occupational History   Not on file  Tobacco Use   Smoking status: Former    Packs/day: 1.00    Years: 40.00    Total pack years: 40.00    Types: Cigarettes    Quit date: 06/05/2022    Years since quitting: 0.2   Smokeless tobacco: Never  Substance and Sexual Activity   Alcohol use: No   Drug use: No   Sexual activity: Not on file  Other Topics Concern   Not on file  Social History Narrative   Not on file   Social Determinants of Health   Financial Resource Strain: High Risk (06/14/2022)   Overall Financial Resource Strain (CARDIA)    Difficulty of Paying Living Expenses: Hard  Food Insecurity: Food Insecurity Present (06/14/2022)   Hunger Vital Sign    Worried About Running Out of Food in the Last Year: Sometimes true    Ran Out of Food in the Last Year: Never true  Transportation Needs: No Transportation Needs (06/14/2022)   PRAPARE - Transportation    Lack of Transportation (Medical): No    Lack of  Transportation (Non-Medical): No  Physical Activity: Not on file  Stress: Not on file  Social Connections: Not on file  Intimate Partner Violence: Not on file    Family History  Problem Relation Age of Onset   Leukemia Mother 60   Throat cancer Maternal Uncle 85   Prostate cancer Maternal Uncle    Breast cancer Cousin 69       maternal first cousin   Ovarian cancer Cousin        maternal first cousin   Breast cancer Cousin 20 - 73       paternal first cousin   Breast cancer Cousin 59 - 70       paternal first cousin     Current Outpatient Medications:    acetaminophen (TYLENOL) 500 MG tablet, Take 500-1,000 mg by mouth every 6 (six) hours as needed (for severe pain/headache)., Disp: , Rfl:    ALPRAZolam (XANAX) 0.25 MG tablet, Take 0.25 mg by mouth 2 (two) times daily as needed for anxiety., Disp: , Rfl:    atorvastatin (LIPITOR) 10 MG tablet, Take 10 mg by mouth every Monday, Wednesday, and Friday.,  Disp: , Rfl:    dexamethasone (DECADRON) 4 MG tablet, Take 2 tablets once a day for 3 days after carboplatin and AC chemotherapy. Take with food., Disp: 30 tablet, Rfl: 1   JARDIANCE 10 MG TABS tablet, Take 10 mg by mouth in the morning., Disp: , Rfl:    lidocaine-prilocaine (EMLA) cream, Apply to affected area once, Disp: 30 g, Rfl: 3   losartan (COZAAR) 100 MG tablet, Take 100 mg by mouth in the morning., Disp: , Rfl:    meclizine (ANTIVERT) 25 MG tablet, Take 25 mg by mouth in the morning., Disp: , Rfl:    metFORMIN (GLUCOPHAGE) 1000 MG tablet, Take 1,000 mg by mouth 2 (two) times daily., Disp: , Rfl:    nicotine (NICODERM CQ - DOSED IN MG/24 HOURS) 14 mg/24hr patch, Place 14 mg onto the skin daily as needed for smoking cessation., Disp: , Rfl:    ondansetron (ZOFRAN) 8 MG tablet, Take 1 tablet (8 mg total) by mouth 2 (two) times daily as needed. Start on the third day after carboplatin and AC chemotherapy., Disp: 30 tablet, Rfl: 1   oxyCODONE (OXY IR/ROXICODONE) 5 MG immediate release tablet, Take 1 tablet (5 mg total) by mouth every 6 (six) hours as needed for severe pain., Disp: 5 tablet, Rfl: 0   OZEMPIC, 1 MG/DOSE, 4 MG/3ML SOPN, Inject 1 mg into the skin every Wednesday., Disp: , Rfl:    prochlorperazine (COMPAZINE) 10 MG tablet, Take 1 tablet (10 mg total) by mouth every 6 (six) hours as needed (Nausea or vomiting)., Disp: 30 tablet, Rfl: 1 No current facility-administered medications for this visit.  Facility-Administered Medications Ordered in Other Visits:    heparin lock flush 100 unit/mL, 500 Units, Intracatheter, Once PRN, Iruku, Praveena, MD   sodium chloride flush (NS) 0.9 % injection 10 mL, 10 mL, Intracatheter, PRN, Iruku, Praveena, MD  PHYSICAL EXAM: ECOG FS:1 - Symptomatic but completely ambulatory  T: 98.3   BP: 131/78  HR: 97   O2: 100% Physical Exam Vitals and nursing note reviewed.  Constitutional:      Appearance: She is well-developed. She is not ill-appearing or  toxic-appearing.  HENT:     Head: Normocephalic.     Nose: Nose normal.     Mouth/Throat:     Mouth: Mucous membranes are moist.     Pharynx: Oropharynx is  clear.     Comments: No oral lesions Eyes:     Conjunctiva/sclera: Conjunctivae normal.  Neck:     Vascular: No JVD.  Cardiovascular:     Rate and Rhythm: Normal rate and regular rhythm.     Pulses: Normal pulses.          Radial pulses are 2+ on the right side and 2+ on the left side.     Heart sounds: Normal heart sounds.  Pulmonary:     Effort: Pulmonary effort is normal.     Breath sounds: Normal breath sounds.  Abdominal:     General: There is no distension.  Musculoskeletal:     Cervical back: Normal range of motion.     Right lower leg: No edema.     Left lower leg: No edema.  Feet:     Right foot:     Toenail Condition: Right toenails are abnormally thick.     Left foot:     Toenail Condition: Left toenails are abnormally thick.  Skin:    General: Skin is warm and dry.     Capillary Refill: Capillary refill takes less than 2 seconds.     Comments: Peeling skin on dorsum of bilateral hands. See media below.  No rash to palms or soles  Neurological:     Mental Status: She is oriented to person, place, and time.         LABORATORY DATA: I have reviewed the data as listed    Latest Ref Rng & Units 09/07/2022    8:13 AM 08/31/2022    7:59 AM 08/24/2022    7:56 AM  CBC  WBC 4.0 - 10.5 K/uL 4.8  2.3  2.7   Hemoglobin 12.0 - 15.0 g/dL 9.0  7.9  8.6   Hematocrit 36.0 - 46.0 % 26.8  23.5  25.1   Platelets 150 - 400 K/uL 107  139  178         Latest Ref Rng & Units 09/07/2022    8:13 AM 08/31/2022    7:59 AM 08/24/2022    7:56 AM  CMP  Glucose 70 - 99 mg/dL 162  121  186   BUN 8 - 23 mg/dL _0 Creatinine 0.44 - 1.00 mg/dL 0.54  0.60  0.60   Sodium 135 - 145 mmol/L 138  140  137   Potassium 3.5 - 5.1 mmol/L 3.5  3.2  3.5   Chloride 98 - 111 mmol/L 105  109  104   CO2 22 - 32 mmol/L _1 Calcium 8.9 - 10.3 mg/dL 8.6  8.7  8.6   Total Protein 6.5 - 8.1 g/dL 5.8  6.4  6.3   Total Bilirubin 0.3 - 1.2 mg/dL 0.3  0.3  0.5   Alkaline Phos 38 - 126 U/L 69  63  64   AST 15 - 41 U/L _2 ALT 0 - 44 U/L _3 RADIOGRAPHIC STUDIES (from last 24 hours if applicable) I have personally reviewed the radiological images as listed and agreed with the findings in the report. No results found.      Visit Diagnosis: 1. Malignant neoplasm of upper-inner quadrant of left breast in female, estrogen receptor negative ([place])   2. Peeling skin      No orders of the defined types were placed in this encounter.  All questions were answered. The patient knows to call the clinic with any problems, questions or concerns. No barriers to learning was detected.  I have spent a total of 20 minutes minutes of face-to-face and non-face-to-face time, preparing to see the patient, obtaining and/or reviewing separately obtained history, performing a medically appropriate examination, counseling and educating the patient, documenting clinical information in the electronic health record, and care coordination (communications with other health care professionals or caregivers).    Thank you for allowing me to participate in the care of this patient.    Barrie Folk, PA-C Department of Hematology/Oncology [facility] at [facility] Phone: [phone]  [phone]    09/07/2022 4:49 PM

## 2022-09-07 NOTE — Patient Instructions (Signed)
[facility] MEDICAL ONCOLOGY  Discharge Instructions: Thank you for choosing [facility] to provide your oncology and hematology care.   If you have a lab appointment with the Cancer Center, please go directly to the Cancer Center and check in at the registration area.   Wear comfortable clothing and clothing appropriate for easy access to any Portacath or PICC line.   We strive to give you quality time with your provider. You may need to reschedule your appointment if you arrive late (15 or more minutes).  Arriving late affects you and other patients whose appointments are after yours.  Also, if you miss three or more appointments without notifying the office, you may be dismissed from the clinic at the provider's discretion.      For prescription refill requests, have your pharmacy contact our office and allow 72 hours for refills to be completed.    Today you received the following chemotherapy and/or immunotherapy agents: pembrolizumab, doxorubicin, cyclophosphamide      To help prevent nausea and vomiting after your treatment, we encourage you to take your nausea medication as directed.  BELOW ARE SYMPTOMS THAT SHOULD BE REPORTED IMMEDIATELY: *FEVER GREATER THAN 100.4 F (38 C) OR HIGHER *CHILLS OR SWEATING *NAUSEA AND VOMITING THAT IS NOT CONTROLLED WITH YOUR NAUSEA MEDICATION *UNUSUAL SHORTNESS OF BREATH *UNUSUAL BRUISING OR BLEEDING *URINARY PROBLEMS (pain or burning when urinating, or frequent urination) *BOWEL PROBLEMS (unusual diarrhea, constipation, pain near the anus) TENDERNESS IN MOUTH AND THROAT WITH OR WITHOUT PRESENCE OF ULCERS (sore throat, sores in mouth, or a toothache) UNUSUAL RASH, SWELLING OR PAIN  UNUSUAL VAGINAL DISCHARGE OR ITCHING   Items with * indicate a potential emergency and should be followed up as soon as possible or go to the Emergency Department if any problems should occur.  Please show the CHEMOTHERAPY ALERT CARD or  IMMUNOTHERAPY ALERT CARD at check-in to the Emergency Department and triage nurse.  Should you have questions after your visit or need to cancel or reschedule your appointment, please contact [facility]  Dept: [phone]  and follow the prompts.  Office hours are 8:00 a.m. to 4:30 p.m. Monday - Friday. Please note that voicemails left after 4:00 p.m. may not be returned until the following business day.  We are closed weekends and major holidays. You have access to a nurse at all times for urgent questions. Please call the main number to the clinic Dept: [phone] and follow the prompts.   For any non-urgent questions, you may also contact your provider using MyChart. We now offer e-Visits for anyone 18 and older to request care online for non-urgent symptoms. For details visit [email or web address].   Also download the MyChart app! Go to the app store, search "MyChart", open the app, select [facility], and log in with your MyChart username and password.  Masks are optional in the cancer centers. If you would like for your care team to wear a mask while they are taking care of you, please let them know. You may have one support person who is at least 62 years old accompany you for your appointments. 

## 2022-09-09 ENCOUNTER — Ambulatory Visit: Payer: BC Managed Care – PPO

## 2022-09-09 ENCOUNTER — Inpatient Hospital Stay: Payer: BC Managed Care – PPO

## 2022-09-09 VITALS — BP 118/75 | HR 104 | Temp 99.0°F | Resp 18

## 2022-09-09 DIAGNOSIS — C50212 Malignant neoplasm of upper-inner quadrant of left female breast: Secondary | ICD-10-CM

## 2022-09-09 DIAGNOSIS — Z5112 Encounter for antineoplastic immunotherapy: Secondary | ICD-10-CM | POA: Diagnosis not present

## 2022-09-09 LAB — T4: T4, Total: 8 ug/dL (ref 4.5–12.0)

## 2022-09-09 MED ORDER — PEGFILGRASTIM-CBQV 6 MG/0.6ML ~~LOC~~ SOSY
6.0000 mg | PREFILLED_SYRINGE | Freq: Once | SUBCUTANEOUS | Status: AC
  Administered 2022-09-09: 6 mg via SUBCUTANEOUS
  Filled 2022-09-09: qty 0.6

## 2022-09-12 ENCOUNTER — Telehealth: Payer: Self-pay | Admitting: *Deleted

## 2022-09-12 NOTE — Telephone Encounter (Signed)
This RN spoke with pt per her call stating "feelings I am having would like to speak with the nurse"  Alexis states " my legs feel weak " " just concerning to me-because it just has me in fear and do not trust myself to drive and go to work "  Pt states she was out last week secondary to her father passing.  She had cycle 1 of A/C ( per flipped regimen of TC) on 10/4 with neulasta on 10/6.  Of note she did not take the dexamethasone post cycle "didn't know I was supposed to" - she denies any issues with nausea.  This RN reviewed above may be related to the neulasta which can affect the sacrum and femur bones due to how it works.  Recommended for her to take 2 tylenol and 2 ibuprophen.  Discussed in addition to above grief issues with pt stating major events occurring post being diagnosed with breast cancer-  In June 2023 her mother became ill and passed "quickly" by July - her husband then had to have a leg amputated and just last week her father passed " he had more health issues then my mother"  " Just so much going on I feel like I haven't had the time to grieve"  This RN validated her stressors and concern that the above issues may affect her physical body in how she is processing these losses.  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  She states her nephew is coming to pick her up to obtain the papers to bring to this office.  At end of phone discussion pt stated appreciation of call - this RN again reiterated for her to call if needed as well as resources of staff available to talk with if she feels the need.

## 2022-09-14 ENCOUNTER — Telehealth: Payer: Self-pay | Admitting: *Deleted

## 2022-09-14 NOTE — Telephone Encounter (Signed)
This RN returned the patient's vm stating she is having continued weakness in her legs.  Obtained her identified VM - message left informing pt of probable need for visit for further evaluation.  Noted opening tomorrow at 1145- on message to pt informed her of this time and to call and verify if she can come in.

## 2022-09-15 ENCOUNTER — Inpatient Hospital Stay: Payer: BC Managed Care – PPO

## 2022-09-15 ENCOUNTER — Telehealth: Payer: Self-pay | Admitting: *Deleted

## 2022-09-15 ENCOUNTER — Inpatient Hospital Stay (HOSPITAL_COMMUNITY)
Admission: EM | Admit: 2022-09-15 | Discharge: 2022-09-21 | DRG: 871 | Disposition: A | Discharge status: Home / Self Care | Payer: BC Managed Care – PPO | Attending: Internal Medicine | Admitting: Internal Medicine

## 2022-09-15 ENCOUNTER — Inpatient Hospital Stay (HOSPITAL_BASED_OUTPATIENT_CLINIC_OR_DEPARTMENT_OTHER): Payer: BC Managed Care – PPO | Admitting: Physician Assistant

## 2022-09-15 ENCOUNTER — Encounter (HOSPITAL_COMMUNITY): Payer: Self-pay | Admitting: Family Medicine

## 2022-09-15 ENCOUNTER — Other Ambulatory Visit: Payer: Self-pay | Admitting: *Deleted

## 2022-09-15 ENCOUNTER — Emergency Department (HOSPITAL_COMMUNITY): Payer: BC Managed Care – PPO

## 2022-09-15 ENCOUNTER — Other Ambulatory Visit: Payer: BC Managed Care – PPO

## 2022-09-15 ENCOUNTER — Other Ambulatory Visit: Payer: Self-pay

## 2022-09-15 VITALS — BP 99/54 | HR 126 | Temp 103.1°F | Resp 20 | Wt 198.0 lb

## 2022-09-15 DIAGNOSIS — F419 Anxiety disorder, unspecified: Secondary | ICD-10-CM | POA: Diagnosis present

## 2022-09-15 DIAGNOSIS — R5081 Fever presenting with conditions classified elsewhere: Secondary | ICD-10-CM | POA: Diagnosis present

## 2022-09-15 DIAGNOSIS — E785 Hyperlipidemia, unspecified: Secondary | ICD-10-CM | POA: Diagnosis present

## 2022-09-15 DIAGNOSIS — D709 Neutropenia, unspecified: Secondary | ICD-10-CM

## 2022-09-15 DIAGNOSIS — Z171 Estrogen receptor negative status [ER-]: Secondary | ICD-10-CM

## 2022-09-15 DIAGNOSIS — Z7984 Long term (current) use of oral hypoglycemic drugs: Secondary | ICD-10-CM

## 2022-09-15 DIAGNOSIS — E119 Type 2 diabetes mellitus without complications: Secondary | ICD-10-CM | POA: Diagnosis present

## 2022-09-15 DIAGNOSIS — A419 Sepsis, unspecified organism: Secondary | ICD-10-CM | POA: Diagnosis not present

## 2022-09-15 DIAGNOSIS — C50212 Malignant neoplasm of upper-inner quadrant of left female breast: Secondary | ICD-10-CM | POA: Diagnosis present

## 2022-09-15 DIAGNOSIS — Z95828 Presence of other vascular implants and grafts: Secondary | ICD-10-CM

## 2022-09-15 DIAGNOSIS — D701 Agranulocytosis secondary to cancer chemotherapy: Secondary | ICD-10-CM | POA: Diagnosis present

## 2022-09-15 DIAGNOSIS — Z7952 Long term (current) use of systemic steroids: Secondary | ICD-10-CM

## 2022-09-15 DIAGNOSIS — Z6829 Body mass index (BMI) 29.0-29.9, adult: Secondary | ICD-10-CM

## 2022-09-15 DIAGNOSIS — Z791 Long term (current) use of non-steroidal anti-inflammatories (NSAID): Secondary | ICD-10-CM

## 2022-09-15 DIAGNOSIS — R29898 Other symptoms and signs involving the musculoskeletal system: Secondary | ICD-10-CM

## 2022-09-15 DIAGNOSIS — M1990 Unspecified osteoarthritis, unspecified site: Secondary | ICD-10-CM | POA: Diagnosis present

## 2022-09-15 DIAGNOSIS — E440 Moderate protein-calorie malnutrition: Secondary | ICD-10-CM | POA: Diagnosis present

## 2022-09-15 DIAGNOSIS — R5383 Other fatigue: Secondary | ICD-10-CM

## 2022-09-15 DIAGNOSIS — Z888 Allergy status to other drugs, medicaments and biological substances status: Secondary | ICD-10-CM

## 2022-09-15 DIAGNOSIS — Z8041 Family history of malignant neoplasm of ovary: Secondary | ICD-10-CM

## 2022-09-15 DIAGNOSIS — Z806 Family history of leukemia: Secondary | ICD-10-CM

## 2022-09-15 DIAGNOSIS — J440 Chronic obstructive pulmonary disease with acute lower respiratory infection: Secondary | ICD-10-CM | POA: Diagnosis present

## 2022-09-15 DIAGNOSIS — F32A Depression, unspecified: Secondary | ICD-10-CM | POA: Diagnosis present

## 2022-09-15 DIAGNOSIS — R6520 Severe sepsis without septic shock: Secondary | ICD-10-CM

## 2022-09-15 DIAGNOSIS — Z803 Family history of malignant neoplasm of breast: Secondary | ICD-10-CM | POA: Diagnosis not present

## 2022-09-15 DIAGNOSIS — Z87891 Personal history of nicotine dependence: Secondary | ICD-10-CM | POA: Diagnosis not present

## 2022-09-15 DIAGNOSIS — Z91040 Latex allergy status: Secondary | ICD-10-CM

## 2022-09-15 DIAGNOSIS — A403 Sepsis due to Streptococcus pneumoniae: Principal | ICD-10-CM | POA: Diagnosis present

## 2022-09-15 DIAGNOSIS — Z79899 Other long term (current) drug therapy: Secondary | ICD-10-CM

## 2022-09-15 DIAGNOSIS — E876 Hypokalemia: Secondary | ICD-10-CM | POA: Diagnosis present

## 2022-09-15 DIAGNOSIS — T451X5A Adverse effect of antineoplastic and immunosuppressive drugs, initial encounter: Secondary | ICD-10-CM | POA: Diagnosis present

## 2022-09-15 DIAGNOSIS — D61810 Antineoplastic chemotherapy induced pancytopenia: Secondary | ICD-10-CM | POA: Diagnosis present

## 2022-09-15 DIAGNOSIS — J189 Pneumonia, unspecified organism: Secondary | ICD-10-CM | POA: Diagnosis present

## 2022-09-15 DIAGNOSIS — I10 Essential (primary) hypertension: Secondary | ICD-10-CM | POA: Diagnosis present

## 2022-09-15 DIAGNOSIS — D649 Anemia, unspecified: Secondary | ICD-10-CM

## 2022-09-15 DIAGNOSIS — R509 Fever, unspecified: Secondary | ICD-10-CM

## 2022-09-15 DIAGNOSIS — E871 Hypo-osmolality and hyponatremia: Secondary | ICD-10-CM | POA: Diagnosis present

## 2022-09-15 DIAGNOSIS — Z885 Allergy status to narcotic agent status: Secondary | ICD-10-CM

## 2022-09-15 DIAGNOSIS — Z20822 Contact with and (suspected) exposure to covid-19: Secondary | ICD-10-CM | POA: Diagnosis present

## 2022-09-15 DIAGNOSIS — Z7985 Long-term (current) use of injectable non-insulin antidiabetic drugs: Secondary | ICD-10-CM

## 2022-09-15 LAB — COMPREHENSIVE METABOLIC PANEL
ALT: 11 U/L (ref 0–44)
AST: 14 U/L — ABNORMAL LOW (ref 15–41)
Albumin: 3.2 g/dL — ABNORMAL LOW (ref 3.5–5.0)
Alkaline Phosphatase: 50 U/L (ref 38–126)
Anion gap: 10 (ref 5–15)
BUN: 9 mg/dL (ref 8–23)
CO2: 20 mmol/L — ABNORMAL LOW (ref 22–32)
Calcium: 8.2 mg/dL — ABNORMAL LOW (ref 8.9–10.3)
Chloride: 97 mmol/L — ABNORMAL LOW (ref 98–111)
Creatinine, Ser: 0.92 mg/dL (ref 0.44–1.00)
GFR, Estimated: >60 mL/min (ref >60)
Glucose, Bld: 218 mg/dL — ABNORMAL HIGH (ref 70–99)
Potassium: 2.9 mmol/L — ABNORMAL LOW (ref 3.5–5.1)
Sodium: 127 mmol/L — ABNORMAL LOW (ref 135–145)
Total Bilirubin: 0.9 mg/dL (ref 0.3–1.2)
Total Protein: 6.3 g/dL — ABNORMAL LOW (ref 6.5–8.1)

## 2022-09-15 LAB — CBC WITH DIFFERENTIAL (CANCER CENTER ONLY)
Abs Immature Granulocytes: 0 10*3/uL (ref 0.00–0.07)
Basophils Absolute: 0 10*3/uL (ref 0.0–0.1)
Basophils Relative: 0 %
Eosinophils Absolute: 0 10*3/uL (ref 0.0–0.5)
Eosinophils Relative: 0 %
HCT: 19.3 % — ABNORMAL LOW (ref 36.0–46.0)
Hemoglobin: 7.1 g/dL — ABNORMAL LOW (ref 12.0–15.0)
Immature Granulocytes: 0 %
Lymphocytes Relative: 76 %
Lymphs Abs: 0.2 10*3/uL — ABNORMAL LOW (ref 0.7–4.0)
MCH: 35 pg — ABNORMAL HIGH (ref 26.0–34.0)
MCHC: 36.8 g/dL — ABNORMAL HIGH (ref 30.0–36.0)
MCV: 95.1 fL (ref 80.0–100.0)
Monocytes Absolute: 0 10*3/uL — ABNORMAL LOW (ref 0.1–1.0)
Monocytes Relative: 10 %
Neutro Abs: 0 10*3/uL — CL (ref 1.7–7.7)
Neutrophils Relative %: 14 %
Platelet Count: 29 10*3/uL — ABNORMAL LOW (ref 150–400)
RBC: 2.03 MIL/uL — ABNORMAL LOW (ref 3.87–5.11)
RDW: 17.9 % — ABNORMAL HIGH (ref 11.5–15.5)
WBC Count: 0.2 10*3/uL — CL (ref 4.0–10.5)
nRBC: 0 % (ref 0.0–0.2)

## 2022-09-15 LAB — RESP PANEL BY RT-PCR (FLU A&B, COVID) ARPGX2
Influenza A by PCR: NEGATIVE
Influenza B by PCR: NEGATIVE
SARS Coronavirus 2 by RT PCR: NEGATIVE

## 2022-09-15 LAB — CMP (CANCER CENTER ONLY)
ALT: 7 U/L (ref 0–44)
AST: 10 U/L — ABNORMAL LOW (ref 15–41)
Albumin: 3.6 g/dL (ref 3.5–5.0)
Alkaline Phosphatase: 57 U/L (ref 38–126)
Anion gap: 9 (ref 5–15)
BUN: 9 mg/dL (ref 8–23)
CO2: 23 mmol/L (ref 22–32)
Calcium: 8.6 mg/dL — ABNORMAL LOW (ref 8.9–10.3)
Chloride: 96 mmol/L — ABNORMAL LOW (ref 98–111)
Creatinine: 0.8 mg/dL (ref 0.44–1.00)
GFR, Estimated: >60 mL/min (ref >60)
Glucose, Bld: 213 mg/dL — ABNORMAL HIGH (ref 70–99)
Potassium: 2.9 mmol/L — ABNORMAL LOW (ref 3.5–5.1)
Sodium: 128 mmol/L — ABNORMAL LOW (ref 135–145)
Total Bilirubin: 0.9 mg/dL (ref 0.3–1.2)
Total Protein: 6.7 g/dL (ref 6.5–8.1)

## 2022-09-15 LAB — GLUCOSE, CAPILLARY: Glucose-Capillary: 180 mg/dL — ABNORMAL HIGH (ref 70–99)

## 2022-09-15 LAB — BRAIN NATRIURETIC PEPTIDE: B Natriuretic Peptide: 34 pg/mL (ref 0.0–100.0)

## 2022-09-15 LAB — ABO/RH: ABO/RH(D): O POS

## 2022-09-15 LAB — SAMPLE TO BLOOD BANK

## 2022-09-15 LAB — MRSA NEXT GEN BY PCR, NASAL: MRSA by PCR Next Gen: NOT DETECTED

## 2022-09-15 LAB — MAGNESIUM: Magnesium: 1.6 mg/dL — ABNORMAL LOW (ref 1.7–2.4)

## 2022-09-15 LAB — LACTIC ACID, PLASMA
Lactic Acid, Venous: 1.6 mmol/L (ref 0.5–1.9)
Lactic Acid, Venous: 1.5 mmol/L (ref 0.5–1.9)

## 2022-09-15 LAB — PREPARE RBC (CROSSMATCH)

## 2022-09-15 MED ORDER — SODIUM CHLORIDE 0.9 % IV SOLN
2.0000 g | Freq: Once | INTRAVENOUS | Status: AC
  Administered 2022-09-15: 2 g via INTRAVENOUS
  Filled 2022-09-15: qty 12.5

## 2022-09-15 MED ORDER — POTASSIUM CHLORIDE CRYS ER 20 MEQ PO TBCR
40.0000 meq | EXTENDED_RELEASE_TABLET | Freq: Once | ORAL | Status: AC
  Administered 2022-09-15: 40 meq via ORAL
  Filled 2022-09-15: qty 2

## 2022-09-15 MED ORDER — SODIUM CHLORIDE 0.9 % IV SOLN: Freq: Once | INTRAVENOUS | Status: AC

## 2022-09-15 MED ORDER — ATORVASTATIN CALCIUM 10 MG PO TABS
10.0000 mg | ORAL_TABLET | ORAL | Status: DC
  Administered 2022-09-16 – 2022-09-21 (×3): 10 mg via ORAL
  Filled 2022-09-15 (×3): qty 1

## 2022-09-15 MED ORDER — ONDANSETRON HCL 4 MG/2ML IJ SOLN: 4.0000 mg | Freq: Four times a day (QID) | INTRAMUSCULAR | Status: DC | PRN

## 2022-09-15 MED ORDER — SODIUM CHLORIDE 0.9 % IV BOLUS
1000.0000 mL | Freq: Once | INTRAVENOUS | Status: AC
  Administered 2022-09-15: 1000 mL via INTRAVENOUS

## 2022-09-15 MED ORDER — MAGNESIUM SULFATE 2 GM/50ML IV SOLN
2.0000 g | Freq: Once | INTRAVENOUS | Status: AC
  Administered 2022-09-15: 2 g via INTRAVENOUS
  Filled 2022-09-15: qty 50

## 2022-09-15 MED ORDER — SODIUM CHLORIDE 0.9% IV SOLUTION: Freq: Once | INTRAVENOUS | Status: AC

## 2022-09-15 MED ORDER — SODIUM CHLORIDE 0.9 % IV BOLUS
500.0000 mL | Freq: Once | INTRAVENOUS | Status: AC
  Administered 2022-09-15: 500 mL via INTRAVENOUS

## 2022-09-15 MED ORDER — ORAL CARE MOUTH RINSE: 15.0000 mL | OROMUCOSAL | Status: DC | PRN

## 2022-09-15 MED ORDER — ONDANSETRON HCL 4 MG PO TABS
4.0000 mg | ORAL_TABLET | Freq: Four times a day (QID) | ORAL | Status: DC | PRN
  Filled 2022-09-15: qty 1

## 2022-09-15 MED ORDER — SENNA 8.6 MG PO TABS
1.0000 | ORAL_TABLET | Freq: Two times a day (BID) | ORAL | Status: DC
  Administered 2022-09-20 – 2022-09-21 (×2): 8.6 mg via ORAL
  Filled 2022-09-15 (×3): qty 1

## 2022-09-15 MED ORDER — ACETAMINOPHEN 650 MG RE SUPP: 650.0000 mg | Freq: Four times a day (QID) | RECTAL | Status: DC | PRN

## 2022-09-15 MED ORDER — CHLORHEXIDINE GLUCONATE CLOTH 2 % EX PADS
6.0000 | MEDICATED_PAD | Freq: Every day | CUTANEOUS | Status: DC
  Administered 2022-09-15 – 2022-09-21 (×7): 6 via TOPICAL

## 2022-09-15 MED ORDER — ACETAMINOPHEN 325 MG PO TABS
650.0000 mg | ORAL_TABLET | Freq: Once | ORAL | Status: AC
  Administered 2022-09-15: 650 mg via ORAL
  Filled 2022-09-15: qty 2

## 2022-09-15 MED ORDER — SODIUM CHLORIDE 0.9 % IV SOLN: INTRAVENOUS | Status: DC

## 2022-09-15 MED ORDER — SODIUM CHLORIDE 0.9% FLUSH
10.0000 mL | Freq: Once | INTRAVENOUS | Status: AC
  Administered 2022-09-15: 10 mL

## 2022-09-15 MED ORDER — ALPRAZOLAM 0.25 MG PO TABS
0.2500 mg | ORAL_TABLET | Freq: Two times a day (BID) | ORAL | Status: DC | PRN
  Administered 2022-09-16 – 2022-09-18 (×3): 0.25 mg via ORAL
  Filled 2022-09-15 (×4): qty 1

## 2022-09-15 MED ORDER — SODIUM CHLORIDE 0.9 % IV SOLN
2.0000 g | Freq: Three times a day (TID) | INTRAVENOUS | Status: DC
  Administered 2022-09-15 – 2022-09-16 (×2): 2 g via INTRAVENOUS
  Filled 2022-09-15 (×2): qty 12.5

## 2022-09-15 MED ORDER — LACTATED RINGERS IV SOLN: INTRAVENOUS | Status: DC

## 2022-09-15 MED ORDER — SODIUM CHLORIDE 0.9 % IV SOLN
500.0000 mg | Freq: Once | INTRAVENOUS | Status: DC
  Filled 2022-09-15: qty 5

## 2022-09-15 MED ORDER — NICOTINE 14 MG/24HR TD PT24
14.0000 mg | MEDICATED_PATCH | Freq: Every day | TRANSDERMAL | Status: DC | PRN
  Filled 2022-09-15: qty 1

## 2022-09-15 MED ORDER — INSULIN ASPART 100 UNIT/ML IJ SOLN
0.0000 [IU] | Freq: Three times a day (TID) | INTRAMUSCULAR | Status: DC
  Administered 2022-09-16: 1 [IU] via SUBCUTANEOUS
  Administered 2022-09-16: 2 [IU] via SUBCUTANEOUS
  Administered 2022-09-20: 1 [IU] via SUBCUTANEOUS
  Filled 2022-09-15: qty 0.09

## 2022-09-15 MED ORDER — ACETAMINOPHEN 325 MG PO TABS
650.0000 mg | ORAL_TABLET | Freq: Four times a day (QID) | ORAL | Status: DC | PRN
  Administered 2022-09-16 – 2022-09-20 (×9): 650 mg via ORAL
  Filled 2022-09-15 (×9): qty 2

## 2022-09-15 MED ORDER — POTASSIUM CHLORIDE 10 MEQ/100ML IV SOLN
10.0000 meq | INTRAVENOUS | Status: AC
  Administered 2022-09-15 (×4): 10 meq via INTRAVENOUS
  Filled 2022-09-15 (×4): qty 100

## 2022-09-15 MED ORDER — DOCUSATE SODIUM 100 MG PO CAPS
100.0000 mg | ORAL_CAPSULE | Freq: Two times a day (BID) | ORAL | Status: DC
  Administered 2022-09-20 – 2022-09-21 (×2): 100 mg via ORAL
  Filled 2022-09-15 (×4): qty 1

## 2022-09-15 MED ORDER — SODIUM CHLORIDE 0.9 % IV SOLN
500.0000 mg | INTRAVENOUS | Status: DC
  Administered 2022-09-15: 500 mg via INTRAVENOUS
  Filled 2022-09-15: qty 5

## 2022-09-15 NOTE — ED Provider Notes (Signed)
[facility] DEPT Provider Note   CSN: [id number] Arrival date & time: 09/15/22  1536     History  Chief Complaint  Patient presents with   Fatigue    Sydney Lee is a 62 y.o. female, history of breast cancer, who presents to the ED secondary to fatigue and a fever of 103 Fahrenheit today.  She states that she has been having fatigue for the last week, but she also states she just started a new chemotherapy last Wednesday on 09/25/23.  And her parents both died this year, and her husband just had his leg amputated so she assumed it was secondary to the depression.  She notes that she was having some weakness in her legs last week, and felt unwell, so she was told to take 2 Tylenol and 2 ibuprofen a day, has been taking that, until this morning.  She went to oncology for her typical appointment, she was found to have a fever 103.  Sent over to the ER secondary to this.  Denies any cough, fever, chills, urinary symptoms, abdominal pain, back pain, headache, neck stiffness or rashes.  Received tylenol at [facility].   Oncologist: Dr. Chryl Heck. Chemotherapy: Pembrolizumab  + Carboplatin + Paclitaxel     Home Medications Prior to Admission medications   Medication Sig Start Date End Date Taking? Authorizing Provider  acetaminophen (TYLENOL) 500 MG tablet Take 1,000 mg by mouth See admin instructions. Take 1,000 mg by mouth one to four times a day for pain   Yes [provider]  ALPRAZolam (XANAX) 0.25 MG tablet Take 0.25 mg by mouth 2 (two) times daily as needed for anxiety. 05/02/22  Yes [provider]  atorvastatin (LIPITOR) 10 MG tablet Take 10 mg by mouth every Monday, Wednesday, and Friday. 05/16/22  Yes [provider]  dexamethasone (DECADRON) 4 MG tablet Take 2 tablets once a day for 3 days after carboplatin and AC chemotherapy. Take with food. Patient taking differently: Take 8 mg by mouth See admin instructions. Take 8 mg (2 tablets) by  mouth once a day for 3 days after carboplatin and AC chemotherapy. Take with food. 06/02/22  Yes Benay Pike, MD  Ibuprofen 200 MG CAPS Take 400 mg by mouth See admin instructions. Take 400 mg by mouth one to four times a day for pain   Yes [provider]  JARDIANCE 10 MG TABS tablet Take 10 mg by mouth in the morning. 03/31/22  Yes [provider]  lidocaine-prilocaine (EMLA) cream Apply to affected area once Patient taking differently: Apply 1 Application topically See admin instructions. Apply as directed for port access 06/02/22  Yes Iruku, Arletha Pili, MD  losartan (COZAAR) 100 MG tablet Take 100 mg by mouth in the morning. 11/15/21  Yes [provider]  meclizine (ANTIVERT) 25 MG tablet Take 25 mg by mouth 3 (three) times daily as needed for dizziness.   Yes [provider]  metFORMIN (GLUCOPHAGE) 1000 MG tablet Take 1,000 mg by mouth in the morning and at bedtime. 03/02/22  Yes [provider]  Nicotine (NICODERM CQ TD) Place 1 patch onto the skin daily as needed (for smoking cessation).   Yes [provider]  OZEMPIC, 1 MG/DOSE, 4 MG/3ML SOPN Inject 1 mg into the skin every Wednesday. 05/16/22  Yes [provider]  prochlorperazine (COMPAZINE) 10 MG tablet Take 1 tablet (10 mg total) by mouth every 6 (six) hours as needed (Nausea or vomiting). Patient taking differently: Take 10 mg by  mouth every 6 (six) hours as needed for nausea or vomiting. 06/02/22  Yes Iruku, Arletha Pili, MD  nicotine (NICODERM CQ - DOSED IN MG/24 HOURS) 14 mg/24hr patch Place 14 mg onto the skin daily as needed for smoking cessation. Patient not taking: Reported on 09/15/2022 12/03/21   [provider]  ondansetron (ZOFRAN) 8 MG tablet Take 1 tablet (8 mg total) by mouth 2 (two) times daily as needed. Start on the third day after carboplatin and AC chemotherapy. 06/02/22   Benay Pike, MD  oxyCODONE (OXY IR/ROXICODONE) 5 MG immediate release tablet Take  1 tablet (5 mg total) by mouth every 6 (six) hours as needed for severe pain. Patient not taking: Reported on 09/15/2022 06/01/22   Stark Klein, MD      Allergies    Emend [aprepitant], Codeine, and Latex    Review of Systems   Review of Systems  Constitutional:  Positive for fatigue and fever.  Respiratory:  Negative for cough and shortness of breath.   Gastrointestinal:  Negative for abdominal pain, diarrhea and vomiting.  Genitourinary:  Negative for dysuria and flank pain.  Musculoskeletal:        +bilateral leg pain  Skin:  Negative for rash.    Physical Exam Updated Vital Signs BP 94/64 (BP Location: Right Arm)   Pulse (!) 110   Temp 100.2 F (37.9 C) (Oral)   Resp 16   SpO2 98%  Physical Exam Vitals and nursing note reviewed.  Constitutional:      General: She is not in acute distress.    Appearance: She is well-developed.  HENT:     Head: Normocephalic and atraumatic.  Eyes:     Conjunctiva/sclera: Conjunctivae normal.  Cardiovascular:     Rate and Rhythm: Normal rate and regular rhythm.     Heart sounds: No murmur heard. Pulmonary:     Effort: No respiratory distress.     Breath sounds: Examination of the left-lower field reveals rales. Rales present. No wheezing.  Chest:     Chest wall: No tenderness.  Abdominal:     Palpations: Abdomen is soft.     Tenderness: There is no abdominal tenderness.  Musculoskeletal:        General: No swelling.     Cervical back: Neck supple.  Skin:    General: Skin is warm and dry.     Capillary Refill: Capillary refill takes less than 2 seconds.     Findings: No rash.  Neurological:     Mental Status: She is alert.  Psychiatric:        Mood and Affect: Mood normal. Affect is tearful.     ED Results / Procedures / Treatments   Labs (all labs ordered are listed, but only abnormal results are displayed) Labs Reviewed  COMPREHENSIVE METABOLIC PANEL - Abnormal; Notable for the following components:      Result  Value   Sodium 127 (*)    Potassium 2.9 (*)    Chloride 97 (*)    CO2 20 (*)    Glucose, Bld 218 (*)    Calcium 8.2 (*)    Total Protein 6.3 (*)    Albumin 3.2 (*)    AST 14 (*)    All other components within normal limits  CBC WITH DIFFERENTIAL/PLATELET - Abnormal; Notable for the following components:   WBC 0.2 (*)    RBC 2.02 (*)    Hemoglobin 6.8 (*)    HCT 20.0 (*)    RDW 18.2 (*)  Platelets 26 (*)    Neutro Abs 0.0 (*)    Lymphs Abs 0.1 (*)    Monocytes Absolute 0.0 (*)    All other components within normal limits  MAGNESIUM - Abnormal; Notable for the following components:   Magnesium 1.6 (*)    All other components within normal limits  RESP PANEL BY RT-PCR (FLU A&B, COVID) ARPGX2  CULTURE, BLOOD (ROUTINE X 2)  CULTURE, BLOOD (ROUTINE X 2)  URINE CULTURE  LACTIC ACID, PLASMA  URINALYSIS, ROUTINE W REFLEX MICROSCOPIC  LACTIC ACID, PLASMA  HIV ANTIBODY (ROUTINE TESTING W REFLEX)  PROTIME-INR  CORTISOL-AM, BLOOD  PROCALCITONIN  BRAIN NATRIURETIC PEPTIDE  COMPREHENSIVE METABOLIC PANEL  CBC  MAGNESIUM  PHOSPHORUS  TYPE AND SCREEN  PREPARE RBC (CROSSMATCH)    EKG None  Radiology DG Chest 2 View  Result Date: 09/15/2022 CLINICAL DATA:  Fever, crackles heard in left lower lung fields EXAM: CHEST - 2 VIEW COMPARISON:  Previous studies including the examination of 06/01/2022 FINDINGS: Cardiac size is within normal limits. There are no signs of pulmonary edema. [place] Priest patchy infiltrate is seen in left lower lung field. There is no pleural effusion or pneumothorax. Tip of left subclavian chest port is seen in the region of right atrium. IMPRESSION: There is new patchy infiltrate in left lower lung field in retrocardiac region suggesting atelectasis/pneumonia in left lower lobe. Electronically Signed   By: Elmer Picker M.D.   On: 09/15/2022 16:18    Procedures Procedures    Medications Ordered in ED Medications  lactated ringers infusion (has no  administration in time range)  potassium chloride 10 mEq in 100 mL IVPB (has no administration in time range)  sodium chloride 0.9 % bolus 1,000 mL (has no administration in time range)  magnesium sulfate IVPB 2 g 50 mL (has no administration in time range)  0.9 %  sodium chloride infusion (has no administration in time range)  azithromycin (ZITHROMAX) 500 mg in sodium chloride 0.9 % 250 mL IVPB (500 mg Intravenous New Bag/Given 09/15/22 1749)  acetaminophen (TYLENOL) tablet 650 mg (has no administration in time range)    Or  acetaminophen (TYLENOL) suppository 650 mg (has no administration in time range)  docusate sodium (COLACE) capsule 100 mg (has no administration in time range)  senna (SENOKOT) tablet 8.6 mg (has no administration in time range)  ondansetron (ZOFRAN) tablet 4 mg (has no administration in time range)    Or  ondansetron (ZOFRAN) injection 4 mg (has no administration in time range)  insulin aspart (novoLOG) injection 0-9 Units (has no administration in time range)  atorvastatin (LIPITOR) tablet 10 mg (has no administration in time range)  ALPRAZolam (XANAX) tablet 0.25 mg (has no administration in time range)  nicotine (NICODERM CQ - dosed in mg/24 hours) patch 14 mg (has no administration in time range)  0.9 %  sodium chloride infusion (Manually program via Guardrails IV Fluids) (has no administration in time range)  sodium chloride 0.9 % bolus 500 mL (0 mLs Intravenous Stopped 09/15/22 1700)  ceFEPIme (MAXIPIME) 2 g in sodium chloride 0.9 % 100 mL IVPB (0 g Intravenous Stopped 09/15/22 1732)  potassium chloride SA (KLOR-CON M) CR tablet 40 mEq (40 mEq Oral Given 09/15/22 1704)    ED Course/ Medical Decision Making/ A&P                           Medical Decision Making Amount and/or Complexity of Data Reviewed Labs: ordered.  Radiology: ordered.  Risk Prescription drug management. Decision regarding hospitalization.  This patient presents to the ED for concern  of fatigue, fever of unknown origin   Co morbidities that complicate the patient evaluation  Breast Cancer  Lab Tests:  I Ordered, and personally interpreted labs.  The pertinent results include: Mag of 1.6, K of 2.9, WBC of 0.2, hgb of 6.8, lactic acid of 1.6   Imaging Studies ordered:  I ordered imaging studies including CXR, which illustrated LLL pneumonia  Consultations Obtained:  I requested consultation with the hospitalist,  and discussed lab and imaging findings as well as pertinent plan - they recommend: admission   Problem List / ED Course / Critical interventions / Medication management  I ordered medication including IVF  for hydration, cefepime and azith for pneumonia  Reevaluation of the patient after these medicines showed that the patient stayed the same I have reviewed the patients home medicines and have made adjustments as needed   Test / Admission - Considered:  Patient is a 61 year old female, history of breast cancer, currently going through chemotherapy, last chemotherapy session was 1 week ago, she is immunocompromise, white blood cell count is 0.2, she is found to have pneumonia of her left lower lobe, started on antibiotics, and IV fluids.  Likely the cause of her fatigue, fever.  Blood cultures drawn, given immunocompromise status, will admit to the hospitalist for further evaluation.  At this time she is not requiring any oxygen, however her heart rate is in the 100s, and her fever of 103, make her concerning for sepsis.  Sepsis activation initiated. Admitted to hospitalist  Final diagnoses:  Pneumonia of left lower lobe due to infectious organism  Hypomagnesemia  Hypokalemia  Sepsis due to pneumonia [facility])    Rx / DC Orders ED Discharge Orders     None         Kassidie Hendriks, Si Gaul, PA 09/15/22 1832    Audley Hose, MD 09/15/22 [phone]

## 2022-09-15 NOTE — Progress Notes (Signed)
Symptom Management Consult note [place]    Patient Care Team: [name], MD as PCP - General (Family Medicine) Mauro Kaufmann, RN as Oncology Nurse Navigator Rockwell Germany, RN as Oncology Nurse Navigator Stark Klein, MD as Consulting Physician (General Surgery) Benay Pike, MD as Consulting Physician (Hematology and Oncology) Gery Pray, MD as Consulting Physician (Radiation Oncology)    Name of the patient: Sydney Lee  [id number]  1961-09-03   Date of visit: 09/15/2022   Chief Complaint/Reason for visit: fatigue and generalized weakenss   Current Therapy: cyclophosphamide, doxorubicin, paclitaxel, pembrolizumab  Last treatment:  Day 1   Cycle 5 on 09/07/22 with Udyenca 09/09/22   ASSESSMENT & PLAN: Patient is a 61 y.o. female  with oncologic history of Malignant neoplasm of upper-inner quadrant of left breast in female, estrogen receptor negative  followed by Dr. Chryl Heck.  I have viewed most recent oncology note and lab work.    #) Malignant neoplasm of upper-inner quadrant of left breast in female, estrogen receptor negative  - Udenyca 09/09/22 -Next appointment with oncologist is 10/25.   #)Neutropenic Fever -Patient ill appearing with fever 103, tachycardic to 126 and and soft BP 99/54. -650 mg tylenol given and IVF started. -Lung exam with rales in left posterior base and hacking cough. ?pneumonia -CBC shows WBC 0.2 and ANC 0.0 with hemoglobin low at 7.1. -Patient taken to ED for further evaluation and likely admission when stable. Report given to ED RN.     Heme/Onc History: Oncology History  Malignant neoplasm of upper-inner quadrant of left breast in female, estrogen receptor negative ([place])  04/27/2022 Mammogram   Diagnostic mammogram showed indeterminate solid mass in the 10:00 location of the left breast.  Small satellite nodule adjacent to the index mass.  Borderline lymph node has cortical thickening of 2.9 mm.  Other  lymph nodes have normal morphology.   05/16/2022 Pathology Results   Left breast needle core biopsy showed invasive poorly differentiated adenocarcinoma, grade 3 with tumor necrosis, lymph node biopsy benign reactive, negative for carcinoma.  Prognostics from the tumor showed ER 0%, negative, PR 0%, negative, Ki-67 of 80% and HER2 negative   05/23/2022 Initial Diagnosis   Malignant neoplasm of upper-inner quadrant of left breast in female, estrogen receptor negative ([place])   05/25/2022 Cancer Staging   Staging form: Breast, AJCC 8th Edition - Clinical: Stage IIB (cT2, cN0, cM0, G3, ER-, PR-, HER2-) - Signed by Benay Pike, MD on 05/25/2022 Stage prefix: Initial diagnosis Histologic grading system: 3 grade system   05/25/2022 Genetic Testing   [facility] CancerNext-Expanded Panel was Negative. Report date was 06/02/2022.  The CancerNext-Expanded gene panel offered by [facility] and includes sequencing, rearrangement, and RNA analysis for the following 77 genes: AIP, ALK, APC, ATM, AXIN2, BAP1, BARD1, BLM, BMPR1A, BRCA1, BRCA2, BRIP1, CDC73, CDH1, CDK4, CDKN1B, CDKN2A, CHEK2, CTNNA1, DICER1, FANCC, FH, FLCN, GALNT12, KIF1B, LZTR1, MAX, MEN1, MET, MLH1, MSH2, MSH3, MSH6, MUTYH, NBN, NF1, NF2, NTHL1, PALB2, PHOX2B, PMS2, POT1, PRKAR1A, PTCH1, PTEN, RAD51C, RAD51D, RB1, RECQL, RET, SDHA, SDHAF2, SDHB, SDHC, SDHD, SMAD4, SMARCA4, SMARCB1, SMARCE1, STK11, SUFU, TMEM127, TP53, TSC1, TSC2, VHL and XRCC2 (sequencing and deletion/duplication); EGFR, EGLN1, HOXB13, KIT, MITF, PDGFRA, POLD1, and POLE (sequencing only); EPCAM and GREM1 (deletion/duplication only).    06/02/2022 Initial Biopsy   Additional left axillary node biopsy   06/06/2022 - 08/02/2022 Chemotherapy   Patient is on Treatment Plan : BREAST Pembrolizumab (200) D1 + Carboplatin (5) D1 + Paclitaxel (80) D1,8,15  q21d X 4 cycles / Pembrolizumab (200) D1 + AC D1 q21d x 4 cycles     06/06/2022 -  Chemotherapy   Patient is on Treatment Plan : BREAST  Pembrolizumab (200) D1 + Carboplatin (5) D1 + Paclitaxel (80) D1,8,15 q21d X 4 cycles / Pembrolizumab (200) D1 + AC D1 q21d x 4 cycles         Interval history-: Sydney Lee is a 61 y.o. female with oncologic history as above presenting to [facility] today with chief complaint of fatigue and weakness x 1 week. Patient states she has been feeling poorly for "awhile." She admits to decreased appetite and barely and Po intake in the last 24 hours. She admits to always having a cough, maybe it is worse right now. She works as Teacher, early years/pre, has not been around kids in the last week so doubts sick contacts. She tells me that her legs feel weak like they are going to give out. She denies any falls. Denies fever, chills, shortness of breath, chest pain, abdominal pain, urinary symptoms, diarrhea.      ROS  All other systems are reviewed and are negative for acute change except as noted in the HPI.    Allergies  Allergen Reactions   Emend [Aprepitant] Shortness Of Breath    Flushing and shortness of breath    Codeine Nausea Only and Other (See Comments)    "Sick on the stomach"   Latex Other (See Comments)    Gloves=itching/burning     Past Medical History:  Diagnosis Date   Anxiety    Arthritis    Breast cancer ([place])    Depression    Diabetes mellitus without complication ([place])    Hypertension      Past Surgical History:  Procedure Laterality Date   PORTACATH PLACEMENT N/A 06/01/2022   Procedure: INSERTION PORT-A-CATH WITH ULTRASOUND GUIDANCE;  Surgeon: Stark Klein, MD;  Location: [facility];  Service: General;  Laterality: N/A;    Social History   Socioeconomic History   Marital status: Married    Spouse name: Not on file   Number of children: Not on file   Years of education: Not on file   Highest education level: Not on file  Occupational History   Not on file  Tobacco Use   Smoking status: Former    Packs/day: 1.00    Years: 40.00    Total pack years: 40.00     Types: Cigarettes    Quit date: 06/05/2022    Years since quitting: 0.2   Smokeless tobacco: Never  Substance and Sexual Activity   Alcohol use: No   Drug use: No   Sexual activity: Not on file  Other Topics Concern   Not on file  Social History Narrative   Not on file   Social Determinants of Health   Financial Resource Strain: High Risk (06/14/2022)   Overall Financial Resource Strain (CARDIA)    Difficulty of Paying Living Expenses: Hard  Food Insecurity: Food Insecurity Present (06/14/2022)   Hunger Vital Sign    Worried About Running Out of Food in the Last Year: Sometimes true    Ran Out of Food in the Last Year: Never true  Transportation Needs: No Transportation Needs (06/14/2022)   PRAPARE - Hydrologist (Medical): No    Lack of Transportation (Non-Medical): No  Physical Activity: Not on file  Stress: Not on file  Social Connections: Not on file  Intimate Partner Violence: Not  on file    Family History  Problem Relation Age of Onset   Leukemia Mother 38   Throat cancer Maternal Uncle 68   Prostate cancer Maternal Uncle    Breast cancer Cousin 6       maternal first cousin   Ovarian cancer Cousin        maternal first cousin   Breast cancer Cousin 5 - 68       paternal first cousin   Breast cancer Cousin 68 - 2       paternal first cousin     Current Outpatient Medications:    acetaminophen (TYLENOL) 500 MG tablet, Take 500-1,000 mg by mouth every 6 (six) hours as needed (for severe pain/headache)., Disp: , Rfl:    ALPRAZolam (XANAX) 0.25 MG tablet, Take 0.25 mg by mouth 2 (two) times daily as needed for anxiety., Disp: , Rfl:    atorvastatin (LIPITOR) 10 MG tablet, Take 10 mg by mouth every Monday, Wednesday, and Friday., Disp: , Rfl:    dexamethasone (DECADRON) 4 MG tablet, Take 2 tablets once a day for 3 days after carboplatin and AC chemotherapy. Take with food., Disp: 30 tablet, Rfl: 1   JARDIANCE 10 MG TABS tablet, Take 10  mg by mouth in the morning., Disp: , Rfl:    lidocaine-prilocaine (EMLA) cream, Apply to affected area once, Disp: 30 g, Rfl: 3   losartan (COZAAR) 100 MG tablet, Take 100 mg by mouth in the morning., Disp: , Rfl:    meclizine (ANTIVERT) 25 MG tablet, Take 25 mg by mouth in the morning., Disp: , Rfl:    metFORMIN (GLUCOPHAGE) 1000 MG tablet, Take 1,000 mg by mouth 2 (two) times daily., Disp: , Rfl:    nicotine (NICODERM CQ - DOSED IN MG/24 HOURS) 14 mg/24hr patch, Place 14 mg onto the skin daily as needed for smoking cessation., Disp: , Rfl:    ondansetron (ZOFRAN) 8 MG tablet, Take 1 tablet (8 mg total) by mouth 2 (two) times daily as needed. Start on the third day after carboplatin and AC chemotherapy., Disp: 30 tablet, Rfl: 1   oxyCODONE (OXY IR/ROXICODONE) 5 MG immediate release tablet, Take 1 tablet (5 mg total) by mouth every 6 (six) hours as needed for severe pain., Disp: 5 tablet, Rfl: 0   OZEMPIC, 1 MG/DOSE, 4 MG/3ML SOPN, Inject 1 mg into the skin every Wednesday., Disp: , Rfl:    prochlorperazine (COMPAZINE) 10 MG tablet, Take 1 tablet (10 mg total) by mouth every 6 (six) hours as needed (Nausea or vomiting)., Disp: 30 tablet, Rfl: 1  PHYSICAL EXAM: ECOG FS:1 - Symptomatic but completely ambulatory    Vitals:   09/15/22 1502  BP: (!) 99/54  Pulse: (!) 126  Resp: 20  Temp: (!) 103.1 F (39.5 C)  TempSrc: Oral  SpO2: 100%  Weight: 198 lb (89.8 kg)   Physical Exam Vitals and nursing note reviewed.  Constitutional:      Appearance: She is well-developed. She is ill-appearing. She is not toxic-appearing.  HENT:     Head: Normocephalic.     Nose: Nose normal.     Mouth/Throat:     Mouth: Mucous membranes are dry.  Eyes:     Conjunctiva/sclera: Conjunctivae normal.  Neck:     Vascular: No JVD.  Cardiovascular:     Rate and Rhythm: Regular rhythm. Tachycardia present.     Pulses: Normal pulses.     Heart sounds: Normal heart sounds.  Pulmonary:     Effort:  Pulmonary  effort is normal.     Breath sounds: Normal breath sounds. No stridor.     Comments: Rales in left posterior lung base. Hacking cough during exam Abdominal:     General: There is no distension.  Musculoskeletal:     Cervical back: Normal range of motion.     Right lower leg: No edema.     Left lower leg: No edema.  Skin:    General: Skin is warm and dry.  Neurological:     Mental Status: She is oriented to person, place, and time.        LABORATORY DATA: I have reviewed the data as listed    Latest Ref Rng & Units 09/15/2022    2:28 PM 09/07/2022    8:13 AM 08/31/2022    7:59 AM  CBC  WBC 4.0 - 10.5 K/uL 0.2  4.8  2.3   Hemoglobin 12.0 - 15.0 g/dL 7.1  9.0  7.9   Hematocrit 36.0 - 46.0 % 19.3  26.8  23.5   Platelets 150 - 400 K/uL 29  107  139         Latest Ref Rng & Units 09/15/2022    2:28 PM 09/07/2022    8:13 AM 08/31/2022    7:59 AM  CMP  Glucose 70 - 99 mg/dL 213  162  121   BUN 8 - 23 mg/dL '9  12  6   ' Creatinine 0.44 - 1.00 mg/dL 0.80  0.54  0.60   Sodium 135 - 145 mmol/L 128  138  140   Potassium 3.5 - 5.1 mmol/L 2.9  3.5  3.2   Chloride 98 - 111 mmol/L 96  105  109   CO2 22 - 32 mmol/L '23  29  26   ' Calcium 8.9 - 10.3 mg/dL 8.6  8.6  8.7   Total Protein 6.5 - 8.1 g/dL 6.7  5.8  6.4   Total Bilirubin 0.3 - 1.2 mg/dL 0.9  0.3  0.3   Alkaline Phos 38 - 126 U/L 57  69  63   AST 15 - 41 U/L '10  11  13   ' ALT 0 - 44 U/L '7  9  10        ' RADIOGRAPHIC STUDIES (from last 24 hours if applicable) I have personally reviewed the radiological images as listed and agreed with the findings in the report. DG Chest 2 View  Result Date: 09/15/2022 CLINICAL DATA:  Fever, crackles heard in left lower lung fields EXAM: CHEST - 2 VIEW COMPARISON:  Previous studies including the examination of 06/01/2022 FINDINGS: Cardiac size is within normal limits. There are no signs of pulmonary edema. New small patchy infiltrate is seen in left lower lung field. There is no pleural  effusion or pneumothorax. Tip of left subclavian chest port is seen in the region of right atrium. IMPRESSION: There is new patchy infiltrate in left lower lung field in retrocardiac region suggesting atelectasis/pneumonia in left lower lobe. Electronically Signed   By: Elmer Picker M.D.   On: 09/15/2022 16:18        Visit Diagnosis: 1. Malignant neoplasm of upper-inner quadrant of left breast in female, estrogen receptor negative ([place])   2. Port-A-Cath in place   3. Neutropenic fever ([place])   4. Anemia, unspecified type      No orders of the defined types were placed in this encounter.   All questions were answered. . No barriers to learning was detected.  I have  spent a total of 30 minutes minutes of face-to-face and non-face-to-face time, preparing to see the patient, obtaining and/or reviewing separately obtained history, performing a medically appropriate examination, counseling and educating the patient, ordering tests, documenting clinical information in the electronic health record, and care coordination (communications with other health care professionals or caregivers).    Thank you for allowing me to participate in the care of this patient.    Barrie Folk, PA-C Department of Hematology/Oncology [facility] at [facility] Phone: [phone]  [phone]    09/15/2022 4:25 PM

## 2022-09-15 NOTE — Sepsis Progress Note (Signed)
eLink is following this Code Sepsis.  Notified provider of need to order lactic acid.  

## 2022-09-15 NOTE — Progress Notes (Signed)
A consult was received from an ED provider for cefepime per pharmacy dosing.  The patient's profile has been reviewed for ht/wt/allergies/indication/available labs.    I agree with the one time order already placed for cefepime 2 g.    Further antibiotics/pharmacy consults should be ordered by admitting physician if indicated.                       Thank you, Suzzanne Cloud, PharmD, BCPS 09/15/2022  4:42 PM

## 2022-09-15 NOTE — Telephone Encounter (Signed)
This RN spoke with pt per her call stating continued "weakness in my legs and just severe fatigue "  She states above is interfering with her ADL's including driving.   Per discussion- pt's nephew can provide transportation later today- post discussion with [facility] provider- appt made for 245 pm today- lab scheduled at 2pm and including a type and hold due to note prior low heme levels.

## 2022-09-15 NOTE — H&P (Signed)
History and Physical    Sydney Lee TDV:[id number] DOB: 03-Feb-1961 DOA: 09/15/2022  PCP: [name], MD   Patient coming from:  Home  I have personally briefly reviewed patient's old medical records in [place]  Chief Complaint: Generalized fatigue.  HPI: Sydney Lee is a 61 y.o. female with PMH significant for breast cancer on chemotherapy, essential hypertension, diabetes mellitus type 2, pancytopenia, anxiety, depression who was sent to the ED from oncologist office for fever.  Patient reports feeling generalized fatigue for last 2 weeks, she has reached out to her oncologist and was advised to take 2 Tylenols and ibuprofen, Patient continued to remain fatigued and feeling unwell.  She has noticed fever 103 at home today, She went to her oncologist office for routine follow up and was sent in the ED for further evaluation.  Patient reports that she is being depressed due to multiple events happening at home.  Her both parents died recently, her husband underwent amputation of his leg, been taking antidepressant medications.  She reports cough, mild shortness of breath but denies any chest pain or dizziness.  ED Course: She was febrile, tachycardic, tachypneic, hypotensive with normal SPO2 on room air. HR 117, RR 20, BP 88/52, temp 103.4, SPO2 98% on room air Labs include sodium 127, potassium 2.9, chloride 97, bicarb 20, glucose 218, BUN 9, creatinine 0.92, calcium 8.2, magnesium 1.6, anion gap 10, alkaline phosphatase 50, albumin 3.2, AST 14, ALT 11, total proteins 6.3, total bilirubin 0.9, lactic acid 1.6, WBC 0.2, hemoglobin 6.8, hematocrit 20.0, platelets 26, influenza negative, COVID-negative, blood cultures were sent.  Chest x-ray shows opacity consistent with pneumonia.  Review of Systems:Review of Systems  Constitutional:  Positive for chills, fever and malaise/fatigue.  HENT: Negative.    Eyes: Negative.   Respiratory:  Positive for cough and shortness of breath.    Cardiovascular: Negative.   Gastrointestinal: Negative.   Genitourinary: Negative.   Musculoskeletal:  Positive for back pain and myalgias.  Skin: Negative.   Neurological: Negative.   Endo/Heme/Allergies: Negative.   Psychiatric/Behavioral: Negative.      Past Medical History:  Diagnosis Date   Anxiety    Arthritis    Breast cancer ([place])    Depression    Diabetes mellitus without complication ([place])    Hypertension     Past Surgical History:  Procedure Laterality Date   PORTACATH PLACEMENT N/A 06/01/2022   Procedure: INSERTION PORT-A-CATH WITH ULTRASOUND GUIDANCE;  Surgeon: Stark Klein, MD;  Location: [facility];  Service: General;  Laterality: N/A;     reports that she quit smoking about 3 months ago. Her smoking use included cigarettes. She has a 40.00 pack-year smoking history. She has never used smokeless tobacco. She reports that she does not drink alcohol and does not use drugs.  Allergies  Allergen Reactions   Emend [Aprepitant] Shortness Of Breath and Other (See Comments)    Flushing and shortness of breath    Codeine Nausea Only and Other (See Comments)    "Sick on the stomach"   Latex Itching and Other (See Comments)    Gloves=itching/burning    Family History  Problem Relation Age of Onset   Leukemia Mother 72   Throat cancer Maternal Uncle 85   Prostate cancer Maternal Uncle    Breast cancer Cousin 52       maternal first cousin   Ovarian cancer Cousin        maternal first cousin   Breast cancer Cousin 38 -  62       paternal first cousin   Breast cancer Cousin 60 - 70       paternal first cousin    Family history reviewed and not pertinent .  Prior to Admission medications   Medication Sig Start Date End Date Taking? Authorizing Provider  acetaminophen (TYLENOL) 500 MG tablet Take 1,000 mg by mouth See admin instructions. Take 1,000 mg by mouth one to four times a day for pain   Yes [provider]  ALPRAZolam (XANAX) 0.25 MG tablet Take  0.25 mg by mouth 2 (two) times daily as needed for anxiety. 05/02/22  Yes [provider]  atorvastatin (LIPITOR) 10 MG tablet Take 10 mg by mouth every Monday, Wednesday, and Friday. 05/16/22  Yes [provider]  dexamethasone (DECADRON) 4 MG tablet Take 2 tablets once a day for 3 days after carboplatin and AC chemotherapy. Take with food. Patient taking differently: Take 8 mg by mouth See admin instructions. Take 8 mg (2 tablets) by mouth once a day for 3 days after carboplatin and AC chemotherapy. Take with food. 06/02/22  Yes Benay Pike, MD  Ibuprofen 200 MG CAPS Take 400 mg by mouth See admin instructions. Take 400 mg by mouth one to four times a day for pain   Yes [provider]  JARDIANCE 10 MG TABS tablet Take 10 mg by mouth in the morning. 03/31/22  Yes [provider]  lidocaine-prilocaine (EMLA) cream Apply to affected area once Patient taking differently: Apply 1 Application topically See admin instructions. Apply as directed for port access 06/02/22  Yes Iruku, Arletha Pili, MD  losartan (COZAAR) 100 MG tablet Take 100 mg by mouth in the morning. 11/15/21  Yes [provider]  meclizine (ANTIVERT) 25 MG tablet Take 25 mg by mouth 3 (three) times daily as needed for dizziness.   Yes [provider]  metFORMIN (GLUCOPHAGE) 1000 MG tablet Take 1,000 mg by mouth in the morning and at bedtime. 03/02/22  Yes [provider]  Nicotine (NICODERM CQ TD) Place 1 patch onto the skin daily as needed (for smoking cessation).   Yes [provider]  OZEMPIC, 1 MG/DOSE, 4 MG/3ML SOPN Inject 1 mg into the skin every Wednesday. 05/16/22  Yes [provider]  prochlorperazine (COMPAZINE) 10 MG tablet Take 1 tablet (10 mg total) by mouth every 6 (six) hours as needed (Nausea or vomiting). Patient taking differently: Take 10 mg by mouth every 6 (six) hours as needed for nausea or vomiting. 06/02/22  Yes Iruku, Arletha Pili, MD  nicotine  (NICODERM CQ - DOSED IN MG/24 HOURS) 14 mg/24hr patch Place 14 mg onto the skin daily as needed for smoking cessation. Patient not taking: Reported on 09/15/2022 12/03/21   [provider]  ondansetron (ZOFRAN) 8 MG tablet Take 1 tablet (8 mg total) by mouth 2 (two) times daily as needed. Start on the third day after carboplatin and AC chemotherapy. 06/02/22   Benay Pike, MD  oxyCODONE (OXY IR/ROXICODONE) 5 MG immediate release tablet Take 1 tablet (5 mg total) by mouth every 6 (six) hours as needed for severe pain. Patient not taking: Reported on 09/15/2022 06/01/22   Stark Klein, MD    Physical Exam: Vitals:   09/15/22 1622 09/15/22 1623 09/15/22 1630 09/15/22 1658  BP: (!) 88/52  99/64 94/64  Pulse: (!) 117 (!) 114 (!) 114 (!) 110  Resp:    16  Temp:      TempSrc:  SpO2: 98% 99% 99% 98%    Constitutional: Appears comfortable, not in any acute distress, deconditioned. Vitals:   09/15/22 1622 09/15/22 1623 09/15/22 1630 09/15/22 1658  BP: (!) 88/52  99/64 94/64  Pulse: (!) 117 (!) 114 (!) 114 (!) 110  Resp:    16  Temp:      TempSrc:      SpO2: 98% 99% 99% 98%   Eyes: PERRL, lids and conjunctivae normal ENMT: Mucous membranes are moist.  Posterior pharynx without exudate. Neck: normal, supple, no masses, no thyromegaly Chest: Chemo-Port noted: Respiratory: Decreased breath sounds on left lower lobe, clear to auscultation on right side.  Respiratory effort normal RR 15 Cardiovascular: S1-S2 heard, regular rate and rhythm, no murmur. Abdomen: Abdomen soft, non tender, non distended, BS+ Musculoskeletal: No edema, no cyanosis, no clubbing.,  Good range of motion. Skin: no rashes, lesions, ulcers. No induration Neurologic: CN 2-12 grossly intact. Sensation intact, DTR normal. Strength 5/5 in all 4.  Psychiatric: Normal judgment and insight. Alert and oriented x 3. Normal mood.     Labs on Admission: I have personally reviewed following labs and imaging  studies  CBC: Recent Labs  Lab 09/15/22 1428 09/15/22 1550  WBC 0.2* 0.2*  NEUTROABS 0.0* 0.0*  HGB 7.1* 6.8*  HCT 19.3* 20.0*  MCV 95.1 99.0  PLT 29* 26*   Basic Metabolic Panel: Recent Labs  Lab 09/15/22 1428 09/15/22 1550  NA 128* 127*  K 2.9* 2.9*  CL 96* 97*  CO2 23 20*  GLUCOSE 213* 218*  BUN 9 9  CREATININE 0.80 0.92  CALCIUM 8.6* 8.2*  MG  --  1.6*   GFR: Estimated Creatinine Clearance: 75.7 mL/min (by C-G formula based on SCr of 0.92 mg/dL). Liver Function Tests: Recent Labs  Lab 09/15/22 1428 09/15/22 1550  AST 10* 14*  ALT 7 11  ALKPHOS 57 50  BILITOT 0.9 0.9  PROT 6.7 6.3*  ALBUMIN 3.6 3.2*   No results for input(s): "LIPASE", "AMYLASE" in the last 168 hours. No results for input(s): "AMMONIA" in the last 168 hours. Coagulation Profile: No results for input(s): "INR", "PROTIME" in the last 168 hours. Cardiac Enzymes: No results for input(s): "CKTOTAL", "CKMB", "CKMBINDEX", "TROPONINI" in the last 168 hours. BNP (last 3 results) No results for input(s): "PROBNP" in the last 8760 hours. HbA1C: No results for input(s): "HGBA1C" in the last 72 hours. CBG: No results for input(s): "GLUCAP" in the last 168 hours. Lipid Profile: No results for input(s): "CHOL", "HDL", "LDLCALC", "TRIG", "CHOLHDL", "LDLDIRECT" in the last 72 hours. Thyroid Function Tests: No results for input(s): "TSH", "T4TOTAL", "FREET4", "T3FREE", "THYROIDAB" in the last 72 hours. Anemia Panel: No results for input(s): "VITAMINB12", "FOLATE", "FERRITIN", "TIBC", "IRON", "RETICCTPCT" in the last 72 hours. Urine analysis: No results found for: "COLORURINE", "APPEARANCEUR", "LABSPEC", "PHURINE", "GLUCOSEU", "HGBUR", "BILIRUBINUR", "KETONESUR", "PROTEINUR", "UROBILINOGEN", "NITRITE", "LEUKOCYTESUR"  Radiological Exams on Admission: DG Chest 2 View  Result Date: 09/15/2022 CLINICAL DATA:  Fever, crackles heard in left lower lung fields EXAM: CHEST - 2 VIEW COMPARISON:  Previous  studies including the examination of 06/01/2022 FINDINGS: Cardiac size is within normal limits. There are no signs of pulmonary edema. New small patchy infiltrate is seen in left lower lung field. There is no pleural effusion or pneumothorax. Tip of left subclavian chest port is seen in the region of right atrium. IMPRESSION: There is new patchy infiltrate in left lower lung field in retrocardiac region suggesting atelectasis/pneumonia in left lower lobe. Electronically Signed   By:  Elmer Picker M.D.   On: 09/15/2022 16:18    EKG: Independently reviewed.  EKG not completed.  please review  Assessment/Plan Principal Problem:   Severe sepsis with acute organ dysfunction (HCC) Active Problems:   Malignant neoplasm of upper-inner quadrant of left breast in female, estrogen receptor negative (HCC)   HTN (hypertension)   Diabetes mellitus, type II ([place])   Anxiety   Depression   Severe sepsis secondary to community-acquired pneumonia Patient presented with fever, tachycardia, tachypnea, hypotension requiring fluid resuscitation,  lactic acid to 1.5 Chest x-ray shows consolidation consistent with left lower lobe pneumonia. Continue IV fluid resuscitation, Continue IV antibiotics cefepime and Zithromax Follow-up blood and urine cultures.  Community-acquired pneumonia Continue empiric antibiotics cefepime and Zithromax Continue incentive spirometry, continue antitussives.  Pancytopenia: Likely secondary to chemotherapy. Hemoglobin 6.8 ,transfuse 1 unit PRBC. Follow posttransfusion CBC  Breast cancer on chemotherapy: Patient reports getting chemotherapy,  follow-up Dr. Vernon Prey Oncology consult in the morning.  Diabetes mellitus type 2: Hold p.o. diabetic medications Carb modified diet, regular insulin sliding scale Obtain hemoglobin A1c  Essential hypertension: Patient takes losartan at home. Holding the antihypertensive medication due to low blood pressure.  Tobacco  abuse: Nicotine patch offered. Smoking counseling completed  COPD: Patient does not appear in acute exacerbation Continue home inhalers  Electrolyte abnormalities(hyponatremia, hypomagnesemia, hypokalemia): Continue IV replacement. Recheck a.m. labs   DVT prophylaxis: SCDs Code Status: Full code Family Communication: No family at bed side. Disposition Plan:   Status is: Inpatient Remains inpatient appropriate because: Admitted for severe sepsis sec. to community-acquired pneumonia requiring IV antibiotics   Consults called: None Admission status: Inpatient   Shawna Clamp MD [facility]   If 7PM-7AM, please contact night-coverage [email or web address]   09/15/2022, 6:12 PM

## 2022-09-15 NOTE — Progress Notes (Signed)
Pharmacy Antibiotic Note  Sydney Lee is a 61 y.o. female admitted on 09/15/2022 with severe sepsis secondary to pneumonia.  Pharmacy has been consulted for cefepime dosing.  Plan: Cefepime 2 g IV every 8 hours Monitor clinical progress, renal function F/U C&S, abx deescalation / LOT      Temp (24hrs), Avg:101.7 F (38.7 C), Min:100.2 F (37.9 C), Max:103.1 F (39.5 C)  Recent Labs  Lab 09/15/22 1428 09/15/22 1550  WBC 0.2* 0.2*  CREATININE 0.80 0.92  LATICACIDVEN  --  1.6    Estimated Creatinine Clearance: 75.7 mL/min (by C-G formula based on SCr of 0.92 mg/dL).    Allergies  Allergen Reactions   Emend [Aprepitant] Shortness Of Breath and Other (See Comments)    Flushing and shortness of breath    Codeine Nausea Only and Other (See Comments)    "Sick on the stomach"   Latex Itching and Other (See Comments)    Gloves=itching/burning    Antimicrobials this admission: 10/12 azithromycin >>  10/12 cefepime >>    Microbiology results: 10/12 BCx: sent  Thank you for allowing pharmacy to be a part of this patient's care.  Suzzanne Cloud, PharmD, BCPS 09/15/2022 7:41 PM

## 2022-09-15 NOTE — ED Triage Notes (Signed)
Pt comes over from the cancer center for fatigue. Pt was found to have a fever of 103 at the cancer center. Last chemo treatment 10/4.

## 2022-09-16 DIAGNOSIS — R6520 Severe sepsis without septic shock: Secondary | ICD-10-CM | POA: Diagnosis not present

## 2022-09-16 DIAGNOSIS — A419 Sepsis, unspecified organism: Secondary | ICD-10-CM | POA: Diagnosis not present

## 2022-09-16 LAB — COMPREHENSIVE METABOLIC PANEL
ALT: 12 U/L (ref 0–44)
AST: 14 U/L — ABNORMAL LOW (ref 15–41)
Albumin: 2.7 g/dL — ABNORMAL LOW (ref 3.5–5.0)
Alkaline Phosphatase: 42 U/L (ref 38–126)
Anion gap: 6 (ref 5–15)
BUN: 9 mg/dL (ref 8–23)
CO2: 22 mmol/L (ref 22–32)
Calcium: 7.8 mg/dL — ABNORMAL LOW (ref 8.9–10.3)
Chloride: 107 mmol/L (ref 98–111)
Creatinine, Ser: 0.53 mg/dL (ref 0.44–1.00)
GFR, Estimated: >60 mL/min (ref >60)
Glucose, Bld: 127 mg/dL — ABNORMAL HIGH (ref 70–99)
Potassium: 3.5 mmol/L (ref 3.5–5.1)
Sodium: 135 mmol/L (ref 135–145)
Total Bilirubin: 0.5 mg/dL (ref 0.3–1.2)
Total Protein: 5.7 g/dL — ABNORMAL LOW (ref 6.5–8.1)

## 2022-09-16 LAB — CBC
HCT: 20.1 % — ABNORMAL LOW (ref 36.0–46.0)
Hemoglobin: 6.7 g/dL — CL (ref 12.0–15.0)
MCH: 33 pg (ref 26.0–34.0)
MCHC: 33.3 g/dL (ref 30.0–36.0)
MCV: 99 fL (ref 80.0–100.0)
Platelets: 20 10*3/uL — CL (ref 150–400)
RBC: 2.03 MIL/uL — ABNORMAL LOW (ref 3.87–5.11)
RDW: 18.2 % — ABNORMAL HIGH (ref 11.5–15.5)
WBC: 0.3 10*3/uL — CL (ref 4.0–10.5)
nRBC: 0 % (ref 0.0–0.2)

## 2022-09-16 LAB — CORTISOL-AM, BLOOD: Cortisol - AM: 22.5 ug/dL (ref 6.7–22.6)

## 2022-09-16 LAB — CBC WITH DIFFERENTIAL/PLATELET
Abs Immature Granulocytes: 0 10*3/uL (ref 0.00–0.07)
Basophils Absolute: 0 10*3/uL (ref 0.0–0.1)
Basophils Relative: 0 %
Eosinophils Absolute: 0 10*3/uL (ref 0.0–0.5)
Eosinophils Relative: 0 %
HCT: 20 % — ABNORMAL LOW (ref 36.0–46.0)
Hemoglobin: 6.8 g/dL — CL (ref 12.0–15.0)
Immature Granulocytes: 0 %
Lymphocytes Relative: 67 %
Lymphs Abs: 0.1 10*3/uL — ABNORMAL LOW (ref 0.7–4.0)
MCH: 33.7 pg (ref 26.0–34.0)
MCHC: 34 g/dL (ref 30.0–36.0)
MCV: 99 fL (ref 80.0–100.0)
Monocytes Absolute: 0 10*3/uL — ABNORMAL LOW (ref 0.1–1.0)
Monocytes Relative: 14 %
Neutro Abs: 0 10*3/uL — CL (ref 1.7–7.7)
Neutrophils Relative %: 19 %
Platelets: 26 10*3/uL — CL (ref 150–400)
RBC: 2.02 MIL/uL — ABNORMAL LOW (ref 3.87–5.11)
RDW: 18.2 % — ABNORMAL HIGH (ref 11.5–15.5)
WBC: 0.2 10*3/uL — CL (ref 4.0–10.5)
nRBC: 0 % (ref 0.0–0.2)

## 2022-09-16 LAB — GLUCOSE, CAPILLARY
Glucose-Capillary: 109 mg/dL — ABNORMAL HIGH (ref 70–99)
Glucose-Capillary: 123 mg/dL — ABNORMAL HIGH (ref 70–99)
Glucose-Capillary: 153 mg/dL — ABNORMAL HIGH (ref 70–99)
Glucose-Capillary: 113 mg/dL — ABNORMAL HIGH (ref 70–99)
Glucose-Capillary: 105 mg/dL — ABNORMAL HIGH (ref 70–99)

## 2022-09-16 LAB — HEMOGLOBIN AND HEMATOCRIT, BLOOD
HCT: 22.1 % — ABNORMAL LOW (ref 36.0–46.0)
Hemoglobin: 7.7 g/dL — ABNORMAL LOW (ref 12.0–15.0)

## 2022-09-16 LAB — BLOOD CULTURE ID PANEL (REFLEXED) - BCID2

## 2022-09-16 LAB — MAGNESIUM: Magnesium: 2.1 mg/dL (ref 1.7–2.4)

## 2022-09-16 LAB — PROTIME-INR
INR: 1.2 (ref 0.8–1.2)
Prothrombin Time: 15.4 seconds — ABNORMAL HIGH (ref 11.4–15.2)

## 2022-09-16 LAB — HIV ANTIBODY (ROUTINE TESTING W REFLEX): HIV Screen 4th Generation wRfx: NONREACTIVE

## 2022-09-16 LAB — PROCALCITONIN: Procalcitonin: 1.56 ng/mL

## 2022-09-16 LAB — PREPARE RBC (CROSSMATCH)

## 2022-09-16 LAB — PHOSPHORUS: Phosphorus: 2.2 mg/dL — ABNORMAL LOW (ref 2.5–4.6)

## 2022-09-16 MED ORDER — SODIUM CHLORIDE 0.9% IV SOLUTION: Freq: Once | INTRAVENOUS | Status: DC

## 2022-09-16 MED ORDER — TBO-FILGRASTIM 480 MCG/0.8ML ~~LOC~~ SOSY
480.0000 ug | PREFILLED_SYRINGE | Freq: Every day | SUBCUTANEOUS | Status: AC
  Administered 2022-09-16 – 2022-09-18 (×3): 480 ug via SUBCUTANEOUS
  Filled 2022-09-16 (×3): qty 0.8

## 2022-09-16 MED ORDER — ENSURE MAX PROTEIN PO LIQD
11.0000 [oz_av] | Freq: Two times a day (BID) | ORAL | Status: DC
  Filled 2022-09-16 (×11): qty 330

## 2022-09-16 MED ORDER — SODIUM CHLORIDE 0.9 % IV BOLUS
500.0000 mL | Freq: Once | INTRAVENOUS | Status: AC
  Administered 2022-09-16: 500 mL via INTRAVENOUS

## 2022-09-16 MED ORDER — SODIUM CHLORIDE 0.9 % IV SOLN
2.0000 g | INTRAVENOUS | Status: DC
  Administered 2022-09-16 – 2022-09-21 (×6): 2 g via INTRAVENOUS
  Filled 2022-09-16 (×6): qty 20

## 2022-09-16 MED ORDER — LOPERAMIDE HCL 2 MG PO CAPS
2.0000 mg | ORAL_CAPSULE | Freq: Two times a day (BID) | ORAL | Status: AC
  Administered 2022-09-16 (×2): 2 mg via ORAL
  Filled 2022-09-16 (×2): qty 1

## 2022-09-16 MED ORDER — SODIUM CHLORIDE 0.9 % IV BOLUS
250.0000 mL | Freq: Once | INTRAVENOUS | Status: AC
  Administered 2022-09-16: 250 mL via INTRAVENOUS

## 2022-09-16 MED ORDER — SODIUM CHLORIDE 0.9% IV SOLUTION: Freq: Once | INTRAVENOUS | Status: AC

## 2022-09-16 NOTE — Progress Notes (Signed)
PHARMACY - PHYSICIAN COMMUNICATION CRITICAL VALUE ALERT - BLOOD CULTURE IDENTIFICATION (BCID)  Sydney Lee is an 61 y.o. female who presented to [facility] on 09/15/2022 with a chief complaint of fatigue  Assessment:  4/4 BCx bottles growing strep pneumo   Name of physician (or Provider) Contacted: Dwyane Dee  Current antibiotics: zithromax and cefepime  Changes to prescribed antibiotics recommended:  Change abx's to Rocephin 2g IV q24  Results for orders placed or performed during the hospital encounter of 09/15/22  Blood Culture ID Panel (Reflexed) (Collected: 09/15/2022  3:50 PM)  Result Value Ref Range   Enterococcus faecalis NOT DETECTED NOT DETECTED   Enterococcus Faecium NOT DETECTED NOT DETECTED   Listeria monocytogenes NOT DETECTED NOT DETECTED   Staphylococcus species NOT DETECTED NOT DETECTED   Staphylococcus aureus (BCID) NOT DETECTED NOT DETECTED   Staphylococcus epidermidis NOT DETECTED NOT DETECTED   Staphylococcus lugdunensis NOT DETECTED NOT DETECTED   Streptococcus species DETECTED (A) NOT DETECTED   Streptococcus agalactiae NOT DETECTED NOT DETECTED   Streptococcus pneumoniae DETECTED (A) NOT DETECTED   Streptococcus pyogenes NOT DETECTED NOT DETECTED   A.calcoaceticus-baumannii NOT DETECTED NOT DETECTED   Bacteroides fragilis NOT DETECTED NOT DETECTED   Enterobacterales NOT DETECTED NOT DETECTED   Enterobacter cloacae complex NOT DETECTED NOT DETECTED   Escherichia coli NOT DETECTED NOT DETECTED   Klebsiella aerogenes NOT DETECTED NOT DETECTED   Klebsiella oxytoca NOT DETECTED NOT DETECTED   Klebsiella pneumoniae NOT DETECTED NOT DETECTED   Proteus species NOT DETECTED NOT DETECTED   Salmonella species NOT DETECTED NOT DETECTED   Serratia marcescens NOT DETECTED NOT DETECTED   Haemophilus influenzae NOT DETECTED NOT DETECTED   Neisseria meningitidis NOT DETECTED NOT DETECTED   Pseudomonas aeruginosa NOT DETECTED NOT DETECTED   Stenotrophomonas maltophilia  NOT DETECTED NOT DETECTED   Candida albicans NOT DETECTED NOT DETECTED   Candida auris NOT DETECTED NOT DETECTED   Candida glabrata NOT DETECTED NOT DETECTED   Candida krusei NOT DETECTED NOT DETECTED   Candida parapsilosis NOT DETECTED NOT DETECTED   Candida tropicalis NOT DETECTED NOT DETECTED   Cryptococcus neoformans/gattii NOT DETECTED NOT DETECTED    [name] 09/16/2022  10:02 AM

## 2022-09-16 NOTE — Progress Notes (Signed)
       CROSS COVER NOTE  NAME: SHAQUINA GILLHAM MRN: [id number] DOB : 1961-04-24    Date of Service   09/16/2022   HPI/Events of Note   Notified by RN of SBP ~ 70-80, MAP ~ 48.  (0120)   On admission patient was noted to also be hypotensive SBP ~80.  1.75 L of fluid were given in the ED. The patient reports that ever since starting chemotherapy her blood pressures have been " lower than usual."  Per RN, patient is A/Ox 4 and asymptomatic.  She is currently receiving NS @ 50cc.  She will also be receiving 1 unit pRBC for a hemoglobin 6.8. Continuous IV fluids increased to 100cc/h and a small 250 NS will also be given to assist with hypotensive episode.  0330 - SBP improved ~ 100's, MAP ~55.  Patient responsive to fluids.  An additional 500 cc ordered.   Interventions/ Plan   NS @ 100 Additional 750 cc NS given for hypotension       Raenette Rover, DNP, [place]

## 2022-09-16 NOTE — Progress Notes (Addendum)
Dr. Dwyane Dee at bedside.  Per Dr. Dwyane Dee, do not give second unit of PRBCs ordered.   Recheck hemoglobin after 1 unit PRBCs transfused.  Also, notified Dr. Dwyane Dee nursing unable to obtain UA and urine culture due to patient having watery stools mixed with urine.  Unable to obtain clean sample.  Patient c/o of diarrhea since last week 10/4.  Immodium ordered by Dr. Dwyane Dee.  Give one dose this morning and another tonight.

## 2022-09-16 NOTE — Progress Notes (Addendum)
PROGRESS NOTE    Sydney Lee  WHQ:[id number] DOB: 1961/07/15 DOA: 09/15/2022 PCP: [name], MD   Brief Narrative:  This 61 y.o. female with PMH significant for breast cancer on chemotherapy, essential hypertension, diabetes mellitus type 2, pancytopenia, anxiety, depression who was sent to the ED from oncologist office for fever.  Patient reports feeling generalized fatigue for last 2 weeks, she has reached out to her oncologist and was advised to take 2 Tylenols and ibuprofen, Patient continued to remain fatigued and feeling unwell. She has noticed fever 103 at home today, She went to her oncologist office for routine follow up and was sent in the ED for further evaluation/patient is admitted for severe sepsis secondary to community-acquired pneumonia.  Blood cultures positive for strep pneumo.  Assessment & Plan:   Principal Problem:   Severe sepsis with acute organ dysfunction (HCC) Active Problems:   Malignant neoplasm of upper-inner quadrant of left breast in female, estrogen receptor negative ([place])   HTN (hypertension)   Diabetes mellitus, type II ([place])   Anxiety   Depression  Severe sepsis secondary to community-acquired pneumonia: Streptococcal pneumococcus bacteremia Neutropenic fever" Patient presented with fever, tachycardia, tachypnea, hypotension requiring fluid resuscitation,  lactic acid to 1.5 Chest x-ray shows consolidation consistent with left lower lobe pneumonia. Continue IV fluid resuscitation, Initiated on IV antibiotics ( cefepime and Zithromax) Blood cultures growing Streptococcus pneumonia. Continue ceftriaxone and Zithromax. Continue neutropenic precautions   Community-acquired pneumonia Continue empiric antibiotics ceftriaxone and Zithromax Continue incentive spirometry, continue antitussives.   Pancytopenia: Likely secondary to chemotherapy. Hemoglobin 6.8 , s/p 1 PRBC>  Hb 6.7 Transfuse 1 unit PRBC. Follow up Hb   Breast cancer on  chemotherapy: Patient reports getting chemotherapy,  follow-up Dr. Chryl Heck Oncology consulted Dr. Chryl Heck.  Diabetes mellitus type 2: Hold p.o. diabetic medications Carb modified diet, regular insulin sliding scale Obtain hemoglobin A1c   Essential hypertension: Resume losartan as blood pressure is improved.   Tobacco abuse: Nicotine patch offered. Smoking counseling completed   COPD: Patient does not appear in acute exacerbation Continue home inhalers   Electrolyte abnormalities(hyponatremia, hypomagnesemia, hypokalemia): Replaced and resolved.   DVT prophylaxis:  Lovenox Code Status: Full code. Family Communication:Daughter at bed side. Disposition Plan:  Status is: Inpatient Remains inpatient appropriate because: Admitted for Streptococcus pneumonia bacteremia requiring IV antibiotics.   Anticipated discharge in few days  Consultants:  Oncology  Procedures: None Antimicrobials: Cefepime, Zithromax, ceftriaxone  Subjective: Patient was seen and examined at bedside.  Overnight events noted.  Patient reports feeling better. She denies any chest pain or shortness of breath.  Reports improving.  Daughter at bedside  Objective: Vitals:   09/16/22 1106 09/16/22 1148 09/16/22 1200 09/16/22 1300  BP: (!) 110/48 (!) 133/120 (!) 140/58 (!) 123/47  Pulse: (!) 110 (!) 103 (!) 102 (!) 110  Resp: 18 (!) 27 (!) 27 (!) 26  Temp:  100 F (37.8 C)    TempSrc:  Oral    SpO2: 100% 100% 100% 93%  Weight:        Intake/Output Summary (Last 24 hours) at 09/16/2022 1328 Last data filed at 09/16/2022 1143 Gross per 24 hour  Intake 5341.58 ml  Output --  Net 5341.58 ml   Filed Weights   09/15/22 2017  Weight: 91.1 kg    Examination:  General exam: Appears comfortable, not in any acute distress, deconditioned Respiratory system: Decreased breath sounds on left lower lobe, clear on right.  Respiratory effort normal, RR 15 Cardiovascular system: S1 & S2  heard, regular rate  and rhythm, no murmur. Gastrointestinal system: Abdomen is soft, non tender, non distended, BS+ Central nervous system: Alert and oriented x 3. No focal neurological deficits. Extremities: No edema, no cyanosis, no clubbing. Skin: No rashes, lesions or ulcers Psychiatry: Judgement and insight appear normal. Mood & affect appropriate.     Data Reviewed: I have personally reviewed following labs and imaging studies  CBC: Recent Labs  Lab 09/15/22 1428 09/15/22 1550 09/16/22 0500  WBC 0.2* 0.2* 0.3*  NEUTROABS 0.0* 0.0*  --   HGB 7.1* 6.8* 6.7*  HCT 19.3* 20.0* 20.1*  MCV 95.1 99.0 99.0  PLT 29* 26* 20*   Basic Metabolic Panel: Recent Labs  Lab 09/15/22 1428 09/15/22 1550 09/16/22 0500  NA 128* 127* 135  K 2.9* 2.9* 3.5  CL 96* 97* 107  CO2 23 20* 22  GLUCOSE 213* 218* 127*  BUN '9 9 9  '$ CREATININE 0.80 0.92 0.53  CALCIUM 8.6* 8.2* 7.8*  MG  --  1.6* 2.1  PHOS  --   --  2.2*   GFR: Estimated Creatinine Clearance: 87.7 mL/min (by C-G formula based on SCr of 0.53 mg/dL). Liver Function Tests: Recent Labs  Lab 09/15/22 1428 09/15/22 1550 09/16/22 0500  AST 10* 14* 14*  ALT '7 11 12  '$ ALKPHOS 57 50 42  BILITOT 0.9 0.9 0.5  PROT 6.7 6.3* 5.7*  ALBUMIN 3.6 3.2* 2.7*   No results for input(s): "LIPASE", "AMYLASE" in the last 168 hours. No results for input(s): "AMMONIA" in the last 168 hours. Coagulation Profile: Recent Labs  Lab 09/16/22 0500  INR 1.2   Cardiac Enzymes: No results for input(s): "CKTOTAL", "CKMB", "CKMBINDEX", "TROPONINI" in the last 168 hours. BNP (last 3 results) No results for input(s): "PROBNP" in the last 8760 hours. HbA1C: No results for input(s): "HGBA1C" in the last 72 hours. CBG: Recent Labs  Lab 09/15/22 2206 09/16/22 0820 09/16/22 0829 09/16/22 1255  GLUCAP 180* 123* 109* 153*   Lipid Profile: No results for input(s): "CHOL", "HDL", "LDLCALC", "TRIG", "CHOLHDL", "LDLDIRECT" in the last 72 hours. Thyroid Function Tests: No  results for input(s): "TSH", "T4TOTAL", "FREET4", "T3FREE", "THYROIDAB" in the last 72 hours. Anemia Panel: No results for input(s): "VITAMINB12", "FOLATE", "FERRITIN", "TIBC", "IRON", "RETICCTPCT" in the last 72 hours. Sepsis Labs: Recent Labs  Lab 09/15/22 1550 09/15/22 2054 09/16/22 0500  PROCALCITON  --   --  1.56  LATICACIDVEN 1.6 1.5  --     Recent Results (from the past 240 hour(s))  Blood culture (routine x 2)     Status: None (Preliminary result)   Collection Time: 09/15/22  3:30 PM   Specimen: BLOOD  Result Value Ref Range Status   Specimen Description   Final    BLOOD PORTA CATH Performed at [facility], [place] [address], [place], [place] [address]    Special Requests   Final    BOTTLES DRAWN AEROBIC AND ANAEROBIC Blood Culture adequate volume Performed at [facility] [address], [place], [place] [address]    Culture  Setup Time   Final    GRAM POSITIVE COCCI IN PAIRS IN BOTH AEROBIC AND ANAEROBIC BOTTLES CRITICAL VALUE NOTED.  VALUE IS CONSISTENT WITH PREVIOUSLY REPORTED AND CALLED VALUE. Performed at [facility], [place] [address] [place],  [address]    Culture GRAM POSITIVE COCCI  Final   Report Status PENDING  Incomplete  Blood culture (routine x 2)     Status: None (Preliminary  result)   Collection Time: 09/15/22  3:50 PM   Specimen: BLOOD  Result Value Ref Range Status   Specimen Description   Final    BLOOD BLOOD RIGHT ARM Performed at [facility] [address] [place], [place] [address]    Special Requests   Final    BOTTLES DRAWN AEROBIC AND ANAEROBIC Blood Culture adequate volume Performed at [facility] [address] [place], [place] [address]    Culture  Setup Time   Final    GRAM POSITIVE COCCI IN PAIRS IN BOTH AEROBIC AND ANAEROBIC BOTTLES CRITICAL RESULT CALLED TO, READ BACK BY AND VERIFIED WITH: PHARMD V. [place] 09/16/22 '@0930'$  BY  AB Performed at [facility], [place] [address], [place], [place] [address]    Culture GRAM POSITIVE COCCI  Final   Report Status PENDING  Incomplete  Blood Culture ID Panel (Reflexed)     Status: Abnormal   Collection Time: 09/15/22  3:50 PM  Result Value Ref Range Status   Enterococcus faecalis NOT DETECTED NOT DETECTED Final   Enterococcus Faecium NOT DETECTED NOT DETECTED Final   Listeria monocytogenes NOT DETECTED NOT DETECTED Final   Staphylococcus species NOT DETECTED NOT DETECTED Final   Staphylococcus aureus (BCID) NOT DETECTED NOT DETECTED Final   Staphylococcus epidermidis NOT DETECTED NOT DETECTED Final   Staphylococcus lugdunensis NOT DETECTED NOT DETECTED Final   Streptococcus species DETECTED (A) NOT DETECTED Final    Comment: CRITICAL RESULT CALLED TO, READ BACK BY AND VERIFIED WITH: PHARMD V. GLOG[place]AC 09/16/22 '@0930'$  BY AB    Streptococcus agalactiae NOT DETECTED NOT DETECTED Final   Streptococcus pneumoniae DETECTED (A) NOT DETECTED Final    Comment: CRITICAL RESULT CALLED TO, READ BACK BY AND VERIFIED WITH: PHARMD V. GLOGOVAC 09/16/22 '@0930'$  BY AB    Streptococcus pyogenes NOT DETECTED NOT DETECTED Final   A.calcoaceticus-baumannii NOT DETECTED NOT DETECTED Final   Bacteroides fragilis NOT DETECTED NOT DETECTED Final   Enterobacterales NOT DETECTED NOT DETECTED Final   Enterobacter cloacae complex NOT DETECTED NOT DETECTED Final   Escherichia coli NOT DETECTED NOT DETECTED Final   Klebsiella aerogenes NOT DETECTED NOT DETECTED Final   Klebsiella oxytoca NOT DETECTED NOT DETECTED Final   Klebsiella pneumoniae NOT DETECTED NOT DETECTED Final   Proteus species NOT DETECTED NOT DETECTED Final   Salmonella species NOT DETECTED NOT DETECTED Final   Serratia marcescens NOT DETECTED NOT DETECTED Final   Haemophilus influenzae NOT DETECTED NOT DETECTED Final   Neisseria meningitidis NOT DETECTED NOT DETECTED Final   Pseudomonas aeruginosa NOT DETECTED NOT DETECTED  Final   Stenotrophomonas maltophilia NOT DETECTED NOT DETECTED Final   Candida albicans NOT DETECTED NOT DETECTED Final   Candida auris NOT DETECTED NOT DETECTED Final   Candida glabrata NOT DETECTED NOT DETECTED Final   Candida krusei NOT DETECTED NOT DETECTED Final   Candida parapsilosis NOT DETECTED NOT DETECTED Final   Candida tropicalis NOT DETECTED NOT DETECTED Final   Cryptococcus neoformans/gattii NOT DETECTED NOT DETECTED Final    Comment: Performed at [facility], [address], [place], [place] [address]  Resp Panel by RT-PCR (Flu A&B, Covid) Anterior Nasal Swab     Status: None   Collection Time: 09/15/22  4:23 PM   Specimen: Anterior Nasal Swab  Result Value Ref Range Status   SARS Coronavirus 2 by RT PCR NEGATIVE NEGATIVE Final    Comment: (NOTE) SARS-CoV-2 target nucleic acids are NOT DETECTED.  The SARS-CoV-2 RNA is  generally detectable in upper respiratory specimens during the acute phase of infection. The lowest concentration of SARS-CoV-2 viral copies this assay can detect is 138 copies/mL. A negative result does not preclude SARS-Cov-2 infection and should not be used as the sole basis for treatment or other patient management decisions. A negative result may occur with  improper specimen collection/handling, submission of specimen other than nasopharyngeal swab, presence of viral mutation(s) within the areas targeted by this assay, and inadequate number of viral copies(<138 copies/mL). A negative result must be combined with clinical observations, patient history, and epidemiological information. The expected result is Negative.  Fact Sheet for Patients:  EntrepreneurPulse.com.au  Fact Sheet for Healthcare Providers:  IncredibleEmployment.be  This test is no t yet approved or cleared by the [place] FDA and  has been authorized for detection and/or diagnosis of SARS-CoV-2 by FDA under an Emergency Use  Authorization (EUA). This EUA will remain  in effect (meaning this test can be used) for the duration of the COVID-19 declaration under Section 564(b)(1) of the Act, 21 U.S.C.section 360bbb-3(b)(1), unless the authorization is terminated  or revoked sooner.       Influenza A by PCR NEGATIVE NEGATIVE Final   Influenza B by PCR NEGATIVE NEGATIVE Final    Comment: (NOTE) The Xpert Xpress SARS-CoV-2/FLU/RSV plus assay is intended as an aid in the diagnosis of influenza from Nasopharyngeal swab specimens and should not be used as a sole basis for treatment. Nasal washings and aspirates are unacceptable for Xpert Xpress SARS-CoV-2/FLU/RSV testing.  Fact Sheet for Patients: EntrepreneurPulse.com.au  Fact Sheet for Healthcare Providers: IncredibleEmployment.be  This test is not yet approved or cleared by the [place] FDA and has been authorized for detection and/or diagnosis of SARS-CoV-2 by FDA under an Emergency Use Authorization (EUA). This EUA will remain in effect (meaning this test can be used) for the duration of the COVID-19 declaration under Section 564(b)(1) of the Act, 21 U.S.C. section 360bbb-3(b)(1), unless the authorization is terminated or revoked.  Performed at [facility], [place] [address] [place], [place] [address]   MRSA Next Gen by PCR, Nasal     Status: None   Collection Time: 09/15/22  9:00 PM   Specimen: Nasal Mucosa; Nasal Swab  Result Value Ref Range Status   MRSA by PCR Next Gen NOT DETECTED NOT DETECTED Final    Comment: (NOTE) The GeneXpert MRSA Assay (FDA approved for NASAL specimens only), is one component of a comprehensive MRSA colonization surveillance program. It is not intended to diagnose MRSA infection nor to guide or monitor treatment for MRSA infections. Test performance is not FDA approved in patients less than 45 years old. Performed at [facility], [place]  [address] [place], [place] [address]     Radiology Studies: DG Chest 2 View  Result Date: 09/15/2022 CLINICAL DATA:  Fever, crackles heard in left lower lung fields EXAM: CHEST - 2 VIEW COMPARISON:  Previous studies including the examination of 06/01/2022 FINDINGS: Cardiac size is within normal limits. There are no signs of pulmonary edema. New small patchy infiltrate is seen in left lower lung field. There is no pleural effusion or pneumothorax. Tip of left subclavian chest port is seen in the region of right atrium. IMPRESSION: There is new patchy infiltrate in left lower lung field in retrocardiac region suggesting atelectasis/pneumonia in left lower lobe. Electronically Signed   By: Elmer Picker M.D.   On: 09/15/2022 16:18    Scheduled Meds:  sodium chloride  Intravenous Once   atorvastatin  10 mg Oral Q M,W,F   Chlorhexidine Gluconate Cloth  6 each Topical Daily   docusate sodium  100 mg Oral BID   insulin aspart  0-9 Units Subcutaneous TID WC   loperamide  2 mg Oral BID   senna  1 tablet Oral BID   Continuous Infusions:  sodium chloride 100 mL/hr at 09/16/22 1140   cefTRIAXone (ROCEPHIN)  IV 2 g (09/16/22 1314)     LOS: 1 day    Time spent: 35 mins    Terrah Decoster, MD [facility]   If 7PM-7AM, please contact night-coverage

## 2022-09-16 NOTE — Consult Note (Signed)
[place]  [phone] Fax:(336) [place]    Referral MD  Reason for Referral: Neutropenic fever  Chief Complaint  Patient presents with   Fatigue    HPI:    This is a very pleasant 61 year old female patient well-known to our service currently on neoadjuvant chemotherapy with CarboTaxol and Keytruda who reported some fever of 103 and weakness in her legs hence was sent to the hospital.  She had great clinical response so far.  When she came to the ER she was investigated for neutropenic fever.  She had a chest x-ray which showed new patchy infiltrate in the left lower lung field and retrocardiac region suggesting atelectasis or pneumonia in the left lower lobe.  She continues to be neutropenic today with WBC count of 0.3, severe anemia hemoglobin of 6.7 and platelet count of 20,000.  She continues to have fevers, mild cough, no overt pain at the port.  She has some diarrhea since antibiotics.  No other complaints except for ongoing weakness in her lower extremities.  Rest of the pertinent 10 point ROS reviewed and negative  Past Medical History:  Diagnosis Date   Anxiety    Arthritis    Breast cancer ([place])    Depression    Diabetes mellitus without complication ([place])    Hypertension   :   Past Surgical History:  Procedure Laterality Date   PORTACATH PLACEMENT N/A 06/01/2022   Procedure: INSERTION PORT-A-CATH WITH ULTRASOUND GUIDANCE;  Surgeon: Stark Klein, MD;  Location: [facility];  Service: General;  Laterality: N/A;  :   Current Facility-Administered Medications  Medication Dose Route Frequency Provider Last Rate Last Admin   0.9 %  sodium chloride infusion (Manually program via Guardrails IV Fluids)   Intravenous Once Raenette Rover, NP   Held at 09/16/22 0905   0.9 %  sodium chloride infusion   Intravenous Continuous Raenette Rover, NP 100 mL/hr at 09/16/22 1513 Infusion Verify at 09/16/22  1513   acetaminophen (TYLENOL) tablet 650 mg  650 mg Oral Q6H PRN Shawna Clamp, MD   650 mg at 09/16/22 1611   Or   acetaminophen (TYLENOL) suppository 650 mg  650 mg Rectal Q6H PRN Shawna Clamp, MD       ALPRAZolam Duanne Moron) tablet 0.25 mg  0.25 mg Oral BID PRN Shawna Clamp, MD       atorvastatin (LIPITOR) tablet 10 mg  10 mg Oral Q M,W,F Shawna Clamp, MD   10 mg at 09/16/22 0904   cefTRIAXone (ROCEPHIN) 2 g in sodium chloride 0.9 % 100 mL IVPB  2 g Intravenous Q24H Shawna Clamp, MD   Stopped at 09/16/22 1344   Chlorhexidine Gluconate Cloth 2 % PADS 6 each  6 each Topical Daily Shawna Clamp, MD   6 each at 09/16/22 0904   docusate sodium (COLACE) capsule 100 mg  100 mg Oral BID Shawna Clamp, MD       insulin aspart (novoLOG) injection 0-9 Units  0-9 Units Subcutaneous TID WC Shawna Clamp, MD   2 Units at 09/16/22 1311   loperamide (IMODIUM) capsule 2 mg  2 mg Oral BID Shawna Clamp, MD   2 mg at 09/16/22 1138   nicotine (NICODERM CQ - dosed in mg/24 hours) patch 14 mg  14 mg Transdermal Daily PRN Shawna Clamp, MD       ondansetron (ZOFRAN) tablet 4 mg  4 mg Oral Q6H PRN Shawna Clamp, MD  Or   ondansetron (ZOFRAN) injection 4 mg  4 mg Intravenous Q6H PRN Shawna Clamp, MD       Oral care mouth rinse  15 mL Mouth Rinse PRN Shawna Clamp, MD       protein supplement (ENSURE MAX) liquid  11 oz Oral BID Shawna Clamp, MD       senna (SENOKOT) tablet 8.6 mg  1 tablet Oral BID Shawna Clamp, MD          Allergies  Allergen Reactions   Emend [Aprepitant] Shortness Of Breath and Other (See Comments)    Flushing and shortness of breath    Codeine Nausea Only and Other (See Comments)    "Sick on the stomach"   Latex Itching and Other (See Comments)    Gloves=itching/burning  :   Family History  Problem Relation Age of Onset   Leukemia Mother 65   Throat cancer Maternal Uncle 41   Prostate cancer Maternal Uncle    Breast cancer Cousin 84       maternal first  cousin   Ovarian cancer Cousin        maternal first cousin   Breast cancer Cousin 4 - 73       paternal first cousin   Breast cancer Cousin 38 - 5       paternal first cousin  :   Social History   Socioeconomic History   Marital status: Married    Spouse name: Not on file   Number of children: Not on file   Years of education: Not on file   Highest education level: Not on file  Occupational History   Not on file  Tobacco Use   Smoking status: Former    Packs/day: 1.00    Years: 40.00    Total pack years: 40.00    Types: Cigarettes    Quit date: 06/05/2022    Years since quitting: 0.2   Smokeless tobacco: Never  Substance and Sexual Activity   Alcohol use: No   Drug use: No   Sexual activity: Not on file  Other Topics Concern   Not on file  Social History Narrative   Not on file   Social Determinants of Health   Financial Resource Strain: High Risk (06/14/2022)   Overall Financial Resource Strain (CARDIA)    Difficulty of Paying Living Expenses: Hard  Food Insecurity: No Food Insecurity (09/15/2022)   Hunger Vital Sign    Worried About Running Out of Food in the Last Year: Never true    Ran Out of Food in the Last Year: Never true  Transportation Needs: No Transportation Needs (09/15/2022)   PRAPARE - Hydrologist (Medical): No    Lack of Transportation (Non-Medical): No  Physical Activity: Not on file  Stress: Not on file  Social Connections: Not on file  Intimate Partner Violence: Not At Risk (09/16/2022)   Humiliation, Afraid, Rape, and Kick questionnaire    Fear of Current or Ex-Partner: No    Emotionally Abused: No    Physically Abused: No    Sexually Abused: No    Exam: Patient Vitals for the past 24 hrs:  BP Temp Temp src Pulse Resp SpO2 Weight  09/16/22 1614 -- (!) 102.8 F (39.3 C) Oral (!) 110 16 99 % --  09/16/22 1600 (!) 126/47 -- -- (!) 103 (!) 29 99 % --  09/16/22 1512 (!) 120/43 -- -- (!) 103 (!) 26 100 % --   09/16/22 1505 --  100.3 F (37.9 C) Oral -- -- -- --  09/16/22 1400 -- -- -- (!) 105 (!) 26 100 % --  09/16/22 1300 (!) 123/47 -- -- (!) 110 (!) 26 93 % --  09/16/22 1200 (!) 140/58 -- -- (!) 102 (!) 27 100 % --  09/16/22 1148 (!) 133/120 100 F (37.8 C) Oral (!) 103 (!) 27 100 % --  09/16/22 1106 (!) 110/48 -- -- (!) 110 18 100 % --  09/16/22 1000 (!) 129/47 -- -- 100 (!) 25 100 % --  09/16/22 0910 (!) 141/50 99 F (37.2 C) Oral 100 (!) 23 100 % --  09/16/22 0830 (!) 142/52 98.9 F (37.2 C) Oral (!) 102 20 100 % --  09/16/22 0800 (!) 147/52 -- -- 98 -- 100 % --  09/16/22 0727 -- 98.4 F (36.9 C) Oral -- -- -- --  09/16/22 0700 (!) 147/63 -- -- 95 20 99 % --  09/16/22 0600 (!) 136/51 -- -- 100 -- 100 % --  09/16/22 0500 (!) 102/48 -- -- 94 -- 99 % --  09/16/22 0446 -- 98.6 F (37 C) Oral -- -- -- --  09/16/22 0400 (!) 118/45 -- -- 93 -- 98 % --  09/16/22 0310 (!) 101/35 -- -- 99 -- 98 % --  09/16/22 0230 (!) 84/36 -- -- 100 -- 96 % --  09/16/22 0200 (!) 92/36 99.6 F (37.6 C) -- (!) 104 -- 98 % --  09/16/22 0111 (!) 80/33 99.8 F (37.7 C) -- (!) 105 -- 98 % --  09/16/22 0014 112/63 (!) 100.5 F (38.1 C) Oral (!) 104 (!) 23 100 % --  09/15/22 2347 (!) 119/58 100.1 F (37.8 C) Oral (!) 102 (!) 24 100 % --  09/15/22 2300 (!) 91/51 -- -- 100 (!) 25 97 % --  09/15/22 2212 (!) 94/53 -- -- (!) 102 20 100 % --  09/15/22 2017 -- 98.6 F (37 C) Oral 98 (!) 25 100 % 200 lb 13.4 oz (91.1 kg)  09/15/22 2000 (!) 105/53 -- -- (!) 101 (!) 26 99 % --  09/15/22 1658 94/64 -- -- (!) 110 16 98 % --    Physical Exam Constitutional:      General: She is not in acute distress.    Appearance: Normal appearance. She is ill-appearing.  Pulmonary:     Comments: Scattered wheezes in the anterior lung fields.  Patient is laying down hence posterior lung fields have not been examined Abdominal:     General: There is no distension.     Palpations: There is no mass.  Musculoskeletal:         General: No swelling or tenderness.  Neurological:     Mental Status: She is alert.     Lab Results  Component Value Date   WBC 0.3 (LL) 09/16/2022   HGB 7.7 (L) 09/16/2022   HCT 22.1 (L) 09/16/2022   PLT 20 (LL) 09/16/2022   GLUCOSE 127 (H) 09/16/2022   ALT 12 09/16/2022   AST 14 (L) 09/16/2022   NA 135 09/16/2022   K 3.5 09/16/2022   CL 107 09/16/2022   CREATININE 0.53 09/16/2022   BUN 9 09/16/2022   CO2 22 09/16/2022    DG Chest 2 View  Result Date: 09/15/2022 CLINICAL DATA:  Fever, crackles heard in left lower lung fields EXAM: CHEST - 2 VIEW COMPARISON:  Previous studies including the examination of 06/01/2022 FINDINGS: Cardiac size is within normal limits. There are no signs  of pulmonary edema. New small patchy infiltrate is seen in left lower lung field. There is no pleural effusion or pneumothorax. Tip of left subclavian chest port is seen in the region of right atrium. IMPRESSION: There is new patchy infiltrate in left lower lung field in retrocardiac region suggesting atelectasis/pneumonia in left lower lobe. Electronically Signed   By: Elmer Picker M.D.   On: 09/15/2022 16:18      DG Chest 2 View  Result Date: 09/15/2022 CLINICAL DATA:  Fever, crackles heard in left lower lung fields EXAM: CHEST - 2 VIEW COMPARISON:  Previous studies including the examination of 06/01/2022 FINDINGS: Cardiac size is within normal limits. There are no signs of pulmonary edema. New small patchy infiltrate is seen in left lower lung field. There is no pleural effusion or pneumothorax. Tip of left subclavian chest port is seen in the region of right atrium. IMPRESSION: There is new patchy infiltrate in left lower lung field in retrocardiac region suggesting atelectasis/pneumonia in left lower lobe. Electronically Signed   By: Elmer Picker M.D.   On: 09/15/2022 16:18    Assessment and Plan:   This is a very pleasant 61 year old female patient with left breast cancer on  neoadjuvant chemotherapy with complete clinical response admitted with fever, weakness of the legs, currently being treated for pneumonia.  She is currently on broad-spectrum antibiotics, continues to spike fevers.  She does not have much respiratory symptoms except for mild cough. She has ongoing neutropenia and leukopenia, received 3 days of G-CSF after her last chemotherapy.  She also appears to be severely anemic and thrombocytopenic, likely severe bone marrow suppression from chemotherapy along with ongoing infection.  I will restart her on G-CSF.  Please discontinue this if ANC greater than 1000 on 2 consecutive days or ANC greater than 5000 on a single occasion.  Please consider transfusing her hemoglobin to maintain a packed red blood cell count of 8 as well as a platelet count of 20,000 in the absence of bleeding. We will continue to monitor her periodically upon discharge we will reevaluate her in the clinic and discuss about further chemotherapy versus proceeding with surgery. I didn't see UA reported, hence added another order Consider CT chest if no improvement in fevers by tomorrow.  The length of time of the face-to-face encounter was 35 minutes. More than 50% of time was spent counseling and coordination of care.  Thank you for this referral.

## 2022-09-16 NOTE — Progress Notes (Signed)
Initial Nutrition Assessment  DOCUMENTATION CODES:   Non-severe (moderate) malnutrition in context of chronic illness  INTERVENTION:  -Continue carb controlled diet as tolerated, consider liberalizing if po intake does not improve with other modifications -Provide Ensure Max BID (150kcal, 30g protein/bottle) -Provide extra condiments on all meal trays (honey/sweetener, lemon, salt and pepper)  NUTRITION DIAGNOSIS:  Moderate Malnutrition related to chronic illness as evidenced by energy intake < or equal to 75% for > or equal to 1 month, mild fat depletion, mild muscle depletion, percent weight loss.  GOAL:  Patient will meet greater than or equal to 90% of their needs  MONITOR:  PO intake, Supplement acceptance  REASON FOR ASSESSMENT:  Malnutrition Screening Tool    ASSESSMENT:  Pt is a 61yo F with PMH of breast cancer on chemotherapy, essential hypertension, diabetes mellitus type 2, pancytopenia, anxiety, depression who was sent to the ED from oncologist office for fever.  Visited pt at bedside with daughter present. Pt reports her weight in May was 221#, current weight of 198# on admission. Per EMR, pt with a 6.8% weight loss in the last 2 months. Reported po intake of mostly vegetables due to taste changes. Diet recall meeting <75% estimated needs for > 1 month. NFPE shows mild muscle wasting and fat loss. Pt meets ASPEN criteria for moderate protein calorie malnutrition.  She reports food has no flavor, but does note that sweet and acidic foods still taste normal. Will add additional condiments to trays (lemon, honey/sweetener, salt and pepper) to help with taste changes. Pt also agreeable to try Ensure Max because she notes most difficulty eating protein rich foods. Ensure Max will provide 150kcal and 30g protein/bottle. BG has been well managed during admission, can consider offering Ensure Plus HP if pt not eating well w/ diet modifications.  Medications reviewed and include:  lipitor, colace, novolog, senna, NS @ 147m/hr  Labs reviewed: BG:109-180, Phos:2.2, K:3.5, AST:14   NUTRITION - FOCUSED PHYSICAL EXAM:  Flowsheet Row Most Recent Value  Orbital Region Mild depletion  Upper Arm Region Moderate depletion  Thoracic and Lumbar Region No depletion  Buccal Region Mild depletion  Temple Region Moderate depletion  Clavicle Bone Region Mild depletion  Clavicle and Acromion Bone Region Mild depletion  Scapular Bone Region Unable to assess  Dorsal Hand Mild depletion  Patellar Region Mild depletion  Anterior Thigh Region No depletion  Posterior Calf Region Mild depletion  Hair Reviewed  [hair loss r/t chemo]  Eyes Reviewed  Mouth Reviewed  [taste changes r/t chemo]  Skin Reviewed  [darkening and peeling of skin]  Nails Reviewed  [nails darker than usual]       Diet Order:   Diet Order             Diet Carb Modified Fluid consistency: Thin; Room service appropriate? Yes  Diet effective now                   EDUCATION NEEDS:   Education needs have been addressed  Skin:  Skin Assessment: Reviewed RN Assessment  Last BM:  10/12-type 7  Height:   Ht Readings from Last 1 Encounters:  09/03/22 '5\' 9"'$  (1.753 m)    Weight:   Wt Readings from Last 1 Encounters:  09/15/22 91.1 kg    Ideal Body Weight:     BMI:  Body mass index is 29.66 kg/m.  Estimated Nutritional Needs:   Kcal:  2100-2400kcal  Protein:  90-110  Fluid:  21026m KaCandise BowensMS, RD,  LDN, CNSC See AMiON for contact information

## 2022-09-16 NOTE — TOC Initial Note (Signed)
Transition of Care [facility]) - Initial/Assessment Note    Patient Details  Name: Sydney Lee MRN: [id number] Date of Birth: [birth year]  Transition of [facility]) CM/SW Contact:    Leeroy Cha, RN Phone Number: 09/16/2022, 9:00 AM  Clinical Narrative:                  Transition of Care [facility]) Screening Note   Patient Details  Name: Sydney Lee Date of Birth: 12-04-1961   Transition of Care [facility]) CM/SW Contact:    Leeroy Cha, RN Phone Number: 09/16/2022, 9:01 AM    Transition of Care Department [facility]) has reviewed patient and no TOC needs have been identified at this time. We will continue to monitor patient advancement through interdisciplinary progression rounds. If new patient transition needs arise, please place a TOC consult.    Expected Discharge Plan: Home/Self Care Barriers to Discharge: Continued Medical Work up   Patient Goals and CMS Choice Patient states their goals for this hospitalization and ongoing recovery are:: to be able to go home CMS Medicare.gov Compare Post Acute Care list provided to:: Patient    Expected Discharge Plan and Services Expected Discharge Plan: Home/Self Care   Discharge Planning Services: CM Consult   Living arrangements for the past 2 months: Single Family Home                                      Prior Living Arrangements/Services Living arrangements for the past 2 months: Single Family Home Lives with:: Spouse Patient language and need for interpreter reviewed:: Yes              Criminal Activity/Legal Involvement Pertinent to Current Situation/Hospitalization: No - Comment as needed  Activities of Daily Living Home Assistive Devices/Equipment: Eyeglasses ADL Screening (condition at time of admission) Patient's cognitive ability adequate to safely complete daily activities?: Yes Is the patient deaf or have difficulty hearing?: No Does the patient have difficulty seeing, even when wearing  glasses/contacts?: No Does the patient have difficulty concentrating, remembering, or making decisions?: No Patient able to express need for assistance with ADLs?: Yes Does the patient have difficulty dressing or bathing?: No Independently performs ADLs?: Yes (appropriate for developmental age) Does the patient have difficulty walking or climbing stairs?: Yes Weakness of Legs: Both Weakness of Arms/Hands: Both  Permission Sought/Granted                  Emotional Assessment Appearance:: Appears stated age Attitude/Demeanor/Rapport: Engaged Affect (typically observed): Calm Orientation: : Oriented to Situation, Oriented to  Time, Oriented to Place, Oriented to Self Alcohol / Substance Use: Tobacco Use (quit smoking three months ago.) Psych Involvement: No (comment)  Admission diagnosis:  Hypokalemia [E87.6] Hypomagnesemia [E83.42] Severe sepsis with acute organ dysfunction ([place]) [A41.9, R65.20] Sepsis due to pneumonia ([place]) [J18.9, A41.9] Pneumonia of left lower lobe due to infectious organism [J18.9] Patient Active Problem List   Diagnosis Date Noted   Severe sepsis with acute organ dysfunction ([place]) 09/15/2022   HTN (hypertension) 09/15/2022   Diabetes mellitus, type II ([place]) 09/15/2022   Anxiety 09/15/2022   Depression 09/15/2022   Port-A-Cath in place 06/06/2022   Infusion reaction 06/06/2022   Genetic testing 06/03/2022   Family history of breast cancer 05/25/2022   Family history of ovarian cancer 05/25/2022   Malignant neoplasm of upper-inner quadrant of left breast in female, estrogen receptor negative ([place]) 05/23/2022  PCP:  [name], MD Pharmacy:   [facility] [address] [place] [address] Phone: [phone] Fax: [phone]     Social Determinants of Health (SDOH) Interventions    Readmission Risk Interventions   No data to display

## 2022-09-17 ENCOUNTER — Inpatient Hospital Stay (HOSPITAL_COMMUNITY): Payer: BC Managed Care – PPO

## 2022-09-17 DIAGNOSIS — R6520 Severe sepsis without septic shock: Secondary | ICD-10-CM | POA: Diagnosis not present

## 2022-09-17 DIAGNOSIS — E440 Moderate protein-calorie malnutrition: Secondary | ICD-10-CM | POA: Insufficient documentation

## 2022-09-17 DIAGNOSIS — A419 Sepsis, unspecified organism: Secondary | ICD-10-CM | POA: Diagnosis not present

## 2022-09-17 LAB — URINALYSIS, ROUTINE W REFLEX MICROSCOPIC
Bacteria, UA: NONE SEEN
Bilirubin Urine: NEGATIVE
Glucose, UA: NEGATIVE mg/dL
Ketones, ur: NEGATIVE mg/dL
Leukocytes,Ua: NEGATIVE
Nitrite: NEGATIVE
Protein, ur: 30 mg/dL — AB
Specific Gravity, Urine: 1.016 (ref 1.005–1.030)
pH: 5 (ref 5.0–8.0)

## 2022-09-17 LAB — GLUCOSE, CAPILLARY
Glucose-Capillary: 82 mg/dL (ref 70–99)
Glucose-Capillary: 99 mg/dL (ref 70–99)
Glucose-Capillary: 94 mg/dL (ref 70–99)

## 2022-09-17 LAB — CBC
HCT: 22.9 % — ABNORMAL LOW (ref 36.0–46.0)
Hemoglobin: 7.9 g/dL — ABNORMAL LOW (ref 12.0–15.0)
MCH: 32.9 pg (ref 26.0–34.0)
MCHC: 34.5 g/dL (ref 30.0–36.0)
MCV: 95.4 fL (ref 80.0–100.0)
Platelets: 17 10*3/uL — CL (ref 150–400)
RBC: 2.4 MIL/uL — ABNORMAL LOW (ref 3.87–5.11)
RDW: 18.9 % — ABNORMAL HIGH (ref 11.5–15.5)
WBC: 0.8 10*3/uL — CL (ref 4.0–10.5)
nRBC: 0 % (ref 0.0–0.2)

## 2022-09-17 LAB — BASIC METABOLIC PANEL
Anion gap: 8 (ref 5–15)
BUN: 6 mg/dL — ABNORMAL LOW (ref 8–23)
CO2: 21 mmol/L — ABNORMAL LOW (ref 22–32)
Calcium: 8 mg/dL — ABNORMAL LOW (ref 8.9–10.3)
Chloride: 107 mmol/L (ref 98–111)
Creatinine, Ser: 0.62 mg/dL (ref 0.44–1.00)
GFR, Estimated: >60 mL/min (ref >60)
Glucose, Bld: 90 mg/dL (ref 70–99)
Potassium: 3.2 mmol/L — ABNORMAL LOW (ref 3.5–5.1)
Sodium: 136 mmol/L (ref 135–145)

## 2022-09-17 LAB — PREPARE RBC (CROSSMATCH)

## 2022-09-17 LAB — HEMOGLOBIN AND HEMATOCRIT, BLOOD
HCT: 24.1 % — ABNORMAL LOW (ref 36.0–46.0)
Hemoglobin: 8.1 g/dL — ABNORMAL LOW (ref 12.0–15.0)

## 2022-09-17 MED ORDER — SODIUM CHLORIDE 0.9% IV SOLUTION: Freq: Once | INTRAVENOUS | Status: AC

## 2022-09-17 MED ORDER — POTASSIUM CHLORIDE 20 MEQ PO PACK
40.0000 meq | PACK | Freq: Once | ORAL | Status: AC
  Administered 2022-09-17: 40 meq via ORAL
  Filled 2022-09-17: qty 2

## 2022-09-17 MED ORDER — SODIUM CHLORIDE (PF) 0.9 % IJ SOLN
INTRAMUSCULAR | Status: AC
  Filled 2022-09-17: qty 50

## 2022-09-17 MED ORDER — IOHEXOL 300 MG/ML  SOLN
100.0000 mL | Freq: Once | INTRAMUSCULAR | Status: AC | PRN
  Administered 2022-09-17: 100 mL via INTRAVENOUS

## 2022-09-17 NOTE — Progress Notes (Signed)
PROGRESS NOTE    Sydney Lee  JOA:[id number] DOB: 03-26-1961 DOA: 09/15/2022 PCP: [name], MD   Brief Narrative:  This 61 y.o. female with PMH significant for breast cancer on chemotherapy, essential hypertension, diabetes mellitus type 2, pancytopenia, anxiety, depression who was sent to the ED from oncologist office for fever.  Patient reports feeling generalized fatigue for last 2 weeks, she has reached out to her oncologist and was advised to take 2 Tylenols and ibuprofen, Patient continued to remain fatigued and feeling unwell. She has noticed fever 103 at home today, She went to her oncologist office for routine follow up and was sent in the ED for further evaluation. Patient is admitted for severe sepsis secondary to community-acquired pneumonia.  Blood cultures positive for strep pneumo.  Assessment & Plan:   Principal Problem:   Severe sepsis with acute organ dysfunction (HCC) Active Problems:   Malignant neoplasm of upper-inner quadrant of left breast in female, estrogen receptor negative (HCC)   HTN (hypertension)   Diabetes mellitus, type II (HCC)   Anxiety   Depression   Malnutrition of moderate degree  Severe sepsis secondary to community-acquired pneumonia: Streptococcal pneumococcus bacteremia Neutropenic fever: Patient presented with fever, tachycardia, tachypnea, hypotension requiring fluid resuscitation,  lactic acid to 1.5. Chest x-ray shows consolidation consistent with left lower lobe pneumonia. Continue IV fluid resuscitation, Initiated on IV antibiotics ( cefepime and Zithromax) Blood cultures growing Streptococcus pneumonia. Continue ceftriaxone and Zithromax. Continue neutropenic precautions. She continued to spike fevers.  Oncologist recommended CT chest.   Community-acquired pneumonia Continue empiric antibiotics ceftriaxone and Zithromax Continue incentive spirometry, continue antitussives.   Pancytopenia: Likely secondary to  chemotherapy. Hemoglobin 6.8 , s/p 2 PRBC> Hb 7.9 Transfuse if hemoglobin below 8.   Breast cancer on chemotherapy: Patient reports getting chemotherapy,  follow-up Dr. Chryl Heck Oncology consulted Dr. Chryl Heck.  Started on Granix for 3 days.  Diabetes mellitus type 2: Hold p.o. diabetic medications Carb modified diet, regular insulin sliding scale Obtain hemoglobin A1c   Essential hypertension: Resume losartan as blood pressure has improved.   Tobacco abuse: Nicotine patch offered. Smoking counseling completed   COPD: Patient does not appear in acute exacerbation Continue home inhalers   Electrolyte abnormalities(hyponatremia, hypomagnesemia, hypokalemia): Replaced and resolved.  Hypokalemia:  Replaced.  Continue to monitor   DVT prophylaxis:  Lovenox Code Status: Full code. Family Communication:Daughter at bed side. Disposition Plan:  Status is: Inpatient Remains inpatient appropriate because: Admitted for Streptococcus pneumonia bacteremia requiring IV antibiotics. She continues to spike fever.  Oncologist recommended CT chest.   Anticipated discharge in few days  Consultants:  Oncology  Procedures: None Antimicrobials: Cefepime, Zithromax, ceftriaxone  Subjective: Patient was seen and examined at bedside.  Overnight events noted.   Patient reports feeling better.  She spiked fever last night and in the morning. She is going to have a CT chest for neutropenia.  Objective: Vitals:   09/17/22 0700 09/17/22 0800 09/17/22 0910 09/17/22 1000  BP: (!) 101/41 127/65  (!) 146/50  Pulse: 96 91  (!) 102  Resp: (!) 23 (!) 27  (!) 29  Temp:   99.9 F (37.7 C) 100 F (37.8 C)  TempSrc:   Oral Oral  SpO2: 97% 98%  99%  Weight:        Intake/Output Summary (Last 24 hours) at 09/17/2022 1025 Last data filed at 09/17/2022 0640 Gross per 24 hour  Intake 2463.44 ml  Output --  Net 2463.44 ml   Filed Weights   09/15/22 2017  Weight: 91.1 kg     Examination:  General exam: Appears comfortable, not in any acute distress, deconditioned. Respiratory system: Decreased breath sounds on the left lower lobe, clear on the right.  Respiratory effort normal, RR 15 Cardiovascular system: S1 & S2 heard, regular rate and rhythm, no murmur. Gastrointestinal system: Abdomen is soft, non tender, non distended, BS+ Central nervous system: Alert and oriented x 3. No focal neurological deficits. Extremities: No edema, no cyanosis, no clubbing. Skin: No rashes, lesions or ulcers Psychiatry: Judgement and insight appear normal. Mood & affect appropriate.     Data Reviewed: I have personally reviewed following labs and imaging studies  CBC: Recent Labs  Lab 09/15/22 1428 09/15/22 1550 09/16/22 0500 09/16/22 1356 09/17/22 0428  WBC 0.2* 0.2* 0.3*  --  0.8*  NEUTROABS 0.0* 0.0*  --   --   --   HGB 7.1* 6.8* 6.7* 7.7* 7.9*  HCT 19.3* 20.0* 20.1* 22.1* 22.9*  MCV 95.1 99.0 99.0  --  95.4  PLT 29* 26* 20*  --  17*   Basic Metabolic Panel: Recent Labs  Lab 09/15/22 1428 09/15/22 1550 09/16/22 0500 09/17/22 0428  NA 128* 127* 135 136  K 2.9* 2.9* 3.5 3.2*  CL 96* 97* 107 107  CO2 23 20* 22 21*  GLUCOSE 213* 218* 127* 90  BUN '9 9 9 '$ 6*  CREATININE 0.80 0.92 0.53 0.62  CALCIUM 8.6* 8.2* 7.8* 8.0*  MG  --  1.6* 2.1  --   PHOS  --   --  2.2*  --    GFR: Estimated Creatinine Clearance: 87.7 mL/min (by C-G formula based on SCr of 0.62 mg/dL). Liver Function Tests: Recent Labs  Lab 09/15/22 1428 09/15/22 1550 09/16/22 0500  AST 10* 14* 14*  ALT '7 11 12  '$ ALKPHOS 57 50 42  BILITOT 0.9 0.9 0.5  PROT 6.7 6.3* 5.7*  ALBUMIN 3.6 3.2* 2.7*   No results for input(s): "LIPASE", "AMYLASE" in the last 168 hours. No results for input(s): "AMMONIA" in the last 168 hours. Coagulation Profile: Recent Labs  Lab 09/16/22 0500  INR 1.2   Cardiac Enzymes: No results for input(s): "CKTOTAL", "CKMB", "CKMBINDEX", "TROPONINI" in the last  168 hours. BNP (last 3 results) No results for input(s): "PROBNP" in the last 8760 hours. HbA1C: No results for input(s): "HGBA1C" in the last 72 hours. CBG: Recent Labs  Lab 09/16/22 0829 09/16/22 1255 09/16/22 1610 09/16/22 2200 09/17/22 0831  GLUCAP 109* 153* 113* 105* 82   Lipid Profile: No results for input(s): "CHOL", "HDL", "LDLCALC", "TRIG", "CHOLHDL", "LDLDIRECT" in the last 72 hours. Thyroid Function Tests: No results for input(s): "TSH", "T4TOTAL", "FREET4", "T3FREE", "THYROIDAB" in the last 72 hours. Anemia Panel: No results for input(s): "VITAMINB12", "FOLATE", "FERRITIN", "TIBC", "IRON", "RETICCTPCT" in the last 72 hours. Sepsis Labs: Recent Labs  Lab 09/15/22 1550 09/15/22 2054 09/16/22 0500  PROCALCITON  --   --  1.56  LATICACIDVEN 1.6 1.5  --     Recent Results (from the past 240 hour(s))  Blood culture (routine x 2)     Status: None (Preliminary result)   Collection Time: 09/15/22  3:30 PM   Specimen: BLOOD  Result Value Ref Range Status   Specimen Description   Final    BLOOD PORTA CATH Performed at [facility], [place] [address] [place], [place] [address]    Special Requests   Final    BOTTLES DRAWN AEROBIC AND ANAEROBIC Blood Culture adequate volume Performed at [facility], [place] [address], [place], [place] [address]    Culture  Setup Time   Final    GRAM POSITIVE COCCI IN PAIRS IN BOTH AEROBIC AND ANAEROBIC BOTTLES CRITICAL VALUE NOTED.  VALUE IS CONSISTENT WITH PREVIOUSLY REPORTED AND CALLED VALUE. Performed at [facility], [place] [address], [place], [place] [address]    Culture GRAM POSITIVE COCCI  Final   Report Status PENDING  Incomplete  Blood culture (routine x 2)     Status: Abnormal (Preliminary result)   Collection Time: 09/15/22  3:50 PM   Specimen: BLOOD  Result Value Ref Range Status   Specimen Description   Final    BLOOD BLOOD RIGHT ARM Performed at [place] [address], [place], [place] [address]    Special Requests   Final    BOTTLES DRAWN AEROBIC AND ANAEROBIC Blood Culture adequate volume Performed at [facility] [address] [place], [place] [address]    Culture  Setup Time   Final    GRAM POSITIVE COCCI IN PAIRS IN BOTH AEROBIC AND ANAEROBIC BOTTLES CRITICAL RESULT CALLED TO, READ BACK BY AND VERIFIED WITH: PHARMD V. [place] 09/16/22 '@0930'$  BY AB    Culture (A)  Final    STREPTOCOCCUS PNEUMONIAE SUSCEPTIBILITIES TO FOLLOW Performed at [facility], [place] [address] [place], [place] [address]    Report Status PENDING  Incomplete  Blood Culture ID Panel (Reflexed)     Status: Abnormal   Collection Time: 09/15/22  3:50 PM  Result Value Ref Range Status   Enterococcus faecalis NOT DETECTED NOT DETECTED Final   Enterococcus Faecium NOT DETECTED NOT DETECTED Final   Listeria monocytogenes NOT DETECTED NOT DETECTED Final   Staphylococcus species NOT DETECTED NOT DETECTED Final   Staphylococcus aureus (BCID) NOT DETECTED NOT DETECTED Final   Staphylococcus epidermidis NOT DETECTED NOT DETECTED Final   Staphylococcus lugdunensis NOT DETECTED NOT DETECTED Final   Streptococcus species DETECTED (A) NOT DETECTED Final    Comment: CRITICAL RESULT CALLED TO, READ BACK BY AND VERIFIED WITH: PHARMD V. GLOGOVAC 09/16/22 '@0930'$  BY AB    Streptococcus agalactiae NOT DETECTED NOT DETECTED Final   Streptococcus pneumoniae DETECTED (A) NOT DETECTED Final    Comment: CRITICAL RESULT CALLED TO, READ BACK BY AND VERIFIED WITH: PHARMD V. GLOGOVAC 09/16/22 '@0930'$  BY AB    Streptococcus pyogenes NOT DETECTED NOT DETECTED Final   A.calcoaceticus-baumannii NOT DETECTED NOT DETECTED Final   Bacteroides fragilis NOT DETECTED NOT DETECTED Final   Enterobacterales NOT DETECTED NOT DETECTED Final   Enterobacter cloacae complex NOT DETECTED NOT DETECTED Final   Escherichia coli NOT DETECTED NOT DETECTED Final   Klebsiella  aerogenes NOT DETECTED NOT DETECTED Final   Klebsiella oxytoca NOT DETECTED NOT DETECTED Final   Klebsiella pneumoniae NOT DETECTED NOT DETECTED Final   Proteus species NOT DETECTED NOT DETECTED Final   Salmonella species NOT DETECTED NOT DETECTED Final   Serratia marcescens NOT DETECTED NOT DETECTED Final   Haemophilus influenzae NOT DETECTED NOT DETECTED Final   Neisseria meningitidis NOT DETECTED NOT DETECTED Final   Pseudomonas aeruginosa NOT DETECTED NOT DETECTED Final   Stenotrophomonas maltophilia NOT DETECTED NOT DETECTED Final   Candida albicans NOT DETECTED NOT DETECTED Final   Candida auris NOT DETECTED NOT DETECTED Final   Candida glabrata NOT DETECTED NOT DETECTED Final   Candida krusei NOT DETECTED NOT DETECTED Final   Candida parapsilosis NOT DETECTED NOT DETECTED Final   Candida tropicalis NOT  DETECTED NOT DETECTED Final   Cryptococcus neoformans/gattii NOT DETECTED NOT DETECTED Final    Comment: Performed at [facility], [place] [address] [place], [place] [address]  Resp Panel by RT-PCR (Flu A&B, Covid) Anterior Nasal Swab     Status: None   Collection Time: 09/15/22  4:23 PM   Specimen: Anterior Nasal Swab  Result Value Ref Range Status   SARS Coronavirus 2 by RT PCR NEGATIVE NEGATIVE Final    Comment: (NOTE) SARS-CoV-2 target nucleic acids are NOT DETECTED.  The SARS-CoV-2 RNA is generally detectable in upper respiratory specimens during the acute phase of infection. The lowest concentration of SARS-CoV-2 viral copies this assay can detect is 138 copies/mL. A negative result does not preclude SARS-Cov-2 infection and should not be used as the sole basis for treatment or other patient management decisions. A negative result may occur with  improper specimen collection/handling, submission of specimen other than nasopharyngeal swab, presence of viral mutation(s) within the areas targeted by this assay, and inadequate number of viral copies(<138 copies/mL). A  negative result must be combined with clinical observations, patient history, and epidemiological information. The expected result is Negative.  Fact Sheet for Patients:  EntrepreneurPulse.com.au  Fact Sheet for Healthcare Providers:  IncredibleEmployment.be  This test is no t yet approved or cleared by the [place] FDA and  has been authorized for detection and/or diagnosis of SARS-CoV-2 by FDA under an Emergency Use Authorization (EUA). This EUA will remain  in effect (meaning this test can be used) for the duration of the COVID-19 declaration under Section 564(b)(1) of the Act, 21 U.S.C.section 360bbb-3(b)(1), unless the authorization is terminated  or revoked sooner.       Influenza A by PCR NEGATIVE NEGATIVE Final   Influenza B by PCR NEGATIVE NEGATIVE Final    Comment: (NOTE) The Xpert Xpress SARS-CoV-2/FLU/RSV plus assay is intended as an aid in the diagnosis of influenza from Nasopharyngeal swab specimens and should not be used as a sole basis for treatment. Nasal washings and aspirates are unacceptable for Xpert Xpress SARS-CoV-2/FLU/RSV testing.  Fact Sheet for Patients: EntrepreneurPulse.com.au  Fact Sheet for Healthcare Providers: IncredibleEmployment.be  This test is not yet approved or cleared by the [place] FDA and has been authorized for detection and/or diagnosis of SARS-CoV-2 by FDA under an Emergency Use Authorization (EUA). This EUA will remain in effect (meaning this test can be used) for the duration of the COVID-19 declaration under Section 564(b)(1) of the Act, 21 U.S.C. section 360bbb-3(b)(1), unless the authorization is terminated or revoked.  Performed at [facility], [place] [address], [place], [place] [address]   MRSA Next Gen by PCR, Nasal     Status: None   Collection Time: 09/15/22  9:00 PM   Specimen: Nasal Mucosa; Nasal Swab  Result Value  Ref Range Status   MRSA by PCR Next Gen NOT DETECTED NOT DETECTED Final    Comment: (NOTE) The GeneXpert MRSA Assay (FDA approved for NASAL specimens only), is one component of a comprehensive MRSA colonization surveillance program. It is not intended to diagnose MRSA infection nor to guide or monitor treatment for MRSA infections. Test performance is not FDA approved in patients less than 65 years old. Performed at [facility], [place] [address], [place],  [address]     Radiology Studies: DG Chest 2 View  Result Date: 09/15/2022 CLINICAL DATA:  Fever, crackles heard in left lower lung fields EXAM: CHEST - 2 VIEW COMPARISON:  Previous  studies including the examination of 06/01/2022 FINDINGS: Cardiac size is within normal limits. There are no signs of pulmonary edema. New small patchy infiltrate is seen in left lower lung field. There is no pleural effusion or pneumothorax. Tip of left subclavian chest port is seen in the region of right atrium. IMPRESSION: There is new patchy infiltrate in left lower lung field in retrocardiac region suggesting atelectasis/pneumonia in left lower lobe. Electronically Signed   By: Elmer Picker M.D.   On: 09/15/2022 16:18    Scheduled Meds:  sodium chloride   Intravenous Once   atorvastatin  10 mg Oral Q M,W,F   Chlorhexidine Gluconate Cloth  6 each Topical Daily   docusate sodium  100 mg Oral BID   insulin aspart  0-9 Units Subcutaneous TID WC   Ensure Max Protein  11 oz Oral BID   senna  1 tablet Oral BID   Tbo-Filgrastim  480 mcg Subcutaneous q1800   Continuous Infusions:  sodium chloride 100 mL/hr at 09/17/22 0823   cefTRIAXone (ROCEPHIN)  IV Stopped (09/16/22 1344)     LOS: 2 days    Time spent: 35 mins    Matsuko Kretz, MD [facility]   If 7PM-7AM, please contact night-coverage

## 2022-09-18 DIAGNOSIS — A419 Sepsis, unspecified organism: Secondary | ICD-10-CM | POA: Diagnosis not present

## 2022-09-18 DIAGNOSIS — R6520 Severe sepsis without septic shock: Secondary | ICD-10-CM | POA: Diagnosis not present

## 2022-09-18 LAB — CBC
HCT: 22.9 % — ABNORMAL LOW (ref 36.0–46.0)
Hemoglobin: 7.6 g/dL — ABNORMAL LOW (ref 12.0–15.0)
MCH: 31.1 pg (ref 26.0–34.0)
MCHC: 33.2 g/dL (ref 30.0–36.0)
MCV: 93.9 fL (ref 80.0–100.0)
Platelets: 23 10*3/uL — CL (ref 150–400)
RBC: 2.44 MIL/uL — ABNORMAL LOW (ref 3.87–5.11)
RDW: 20.7 % — ABNORMAL HIGH (ref 11.5–15.5)
WBC: 1.9 10*3/uL — ABNORMAL LOW (ref 4.0–10.5)
nRBC: 0 % (ref 0.0–0.2)

## 2022-09-18 LAB — GLUCOSE, CAPILLARY
Glucose-Capillary: 97 mg/dL (ref 70–99)
Glucose-Capillary: 85 mg/dL (ref 70–99)
Glucose-Capillary: 96 mg/dL (ref 70–99)
Glucose-Capillary: 94 mg/dL (ref 70–99)
Glucose-Capillary: 94 mg/dL (ref 70–99)

## 2022-09-18 LAB — CULTURE, BLOOD (ROUTINE X 2)
Special Requests: ADEQUATE
Special Requests: ADEQUATE

## 2022-09-18 LAB — BASIC METABOLIC PANEL
Anion gap: 6 (ref 5–15)
BUN: <5 mg/dL — ABNORMAL LOW (ref 8–23)
CO2: 23 mmol/L (ref 22–32)
Calcium: 7.9 mg/dL — ABNORMAL LOW (ref 8.9–10.3)
Chloride: 107 mmol/L (ref 98–111)
Creatinine, Ser: 0.53 mg/dL (ref 0.44–1.00)
GFR, Estimated: >60 mL/min (ref >60)
Glucose, Bld: 90 mg/dL (ref 70–99)
Potassium: 2.9 mmol/L — ABNORMAL LOW (ref 3.5–5.1)
Sodium: 136 mmol/L (ref 135–145)

## 2022-09-18 LAB — PHOSPHORUS: Phosphorus: 2.2 mg/dL — ABNORMAL LOW (ref 2.5–4.6)

## 2022-09-18 LAB — MAGNESIUM: Magnesium: 1.8 mg/dL (ref 1.7–2.4)

## 2022-09-18 MED ORDER — POTASSIUM CHLORIDE 20 MEQ PO PACK
40.0000 meq | PACK | Freq: Once | ORAL | Status: AC
  Administered 2022-09-18: 40 meq via ORAL
  Filled 2022-09-18: qty 2

## 2022-09-18 MED ORDER — POTASSIUM PHOSPHATES 15 MMOLE/5ML IV SOLN
30.0000 mmol | Freq: Once | INTRAVENOUS | Status: AC
  Administered 2022-09-18: 30 mmol via INTRAVENOUS
  Filled 2022-09-18: qty 10

## 2022-09-18 MED ORDER — POTASSIUM CHLORIDE CRYS ER 20 MEQ PO TBCR
40.0000 meq | EXTENDED_RELEASE_TABLET | Freq: Once | ORAL | Status: AC
  Administered 2022-09-18: 40 meq via ORAL
  Filled 2022-09-18: qty 2

## 2022-09-18 MED ORDER — GABAPENTIN 100 MG PO CAPS
100.0000 mg | ORAL_CAPSULE | Freq: Three times a day (TID) | ORAL | Status: DC
  Administered 2022-09-18 – 2022-09-21 (×10): 100 mg via ORAL
  Filled 2022-09-18 (×10): qty 1

## 2022-09-18 NOTE — Progress Notes (Signed)
PROGRESS NOTE    Sydney Lee  WUJ:[id number] DOB: [birth year] DOA: 09/15/2022 PCP: [name], MD   Brief Narrative:  This 61 y.o. female with PMH significant for breast cancer on chemotherapy, essential hypertension, diabetes mellitus type 2, pancytopenia, anxiety, depression who was sent to the ED from oncologist office for fever.  Patient reports feeling generalized fatigue for last 2 weeks, she has reached out to her oncologist and was advised to take 2 Tylenols and ibuprofen, Patient continued to remain fatigued and feeling unwell. She has noticed fever 103 at home today, She went to her oncologist office for routine follow up and was sent in the ED for further evaluation. Patient is admitted for severe sepsis secondary to community-acquired pneumonia.  Blood cultures positive for strep pneumo.  Assessment & Plan:   Principal Problem:   Severe sepsis with acute organ dysfunction (HCC) Active Problems:   Malignant neoplasm of upper-inner quadrant of left breast in female, estrogen receptor negative (HCC)   HTN (hypertension)   Diabetes mellitus, type II (HCC)   Anxiety   Depression   Malnutrition of moderate degree  Severe sepsis secondary to community-acquired pneumonia: Streptococcal pneumococcus bacteremia Neutropenic fever: Patient presented with fever, tachycardia, tachypnea, hypotension requiring fluid resuscitation,  lactic acid to 1.5. Chest x-ray shows consolidation consistent with left lower lobe pneumonia. Continue IV fluid resuscitation, Initiated on IV antibiotics ( cefepime and Zithromax) Blood cultures growing Streptococcus pneumonia. Continue ceftriaxone 2 gm iv daily Continue neutropenic precautions. She continues to spike fever.  CT chest showed pneumonia but no other abnormalities.   Community-acquired pneumonia Continue ceftriaxone 2 g IV daily. Continue incentive spirometry, continue antitussives.   Pancytopenia: Likely secondary to  chemotherapy. Hemoglobin 6.8 , s/p 2 PRBC> Hb 7.9 >7.6 Transfuse if hemoglobin below 8.   Breast cancer on chemotherapy: Patient reports getting chemotherapy,  follow-up Dr. Chryl Heck Oncology consulted Dr. Chryl Heck.  Started on Granix for 3 days.  Diabetes mellitus type 2: Hold p.o. diabetic medications Carb modified diet, regular insulin sliding scale Obtain hemoglobin A1c   Essential hypertension: Continue losartan as blood pressure has improved.   Tobacco abuse: Nicotine patch offered. Smoking counseling completed   COPD: Patient does not appear in acute exacerbation Continue home inhalers   Electrolyte abnormalities(hyponatremia, hypomagnesemia, hypokalemia): Replaced and resolved.  Hypokalemia:  Replaced.  Continue to monitor   DVT prophylaxis:  Lovenox Code Status: Full code. Family Communication:Daughter at bed side. Disposition Plan:  Status is: Inpatient Remains inpatient appropriate because: Admitted for Streptococcus pneumonia bacteremia requiring IV antibiotics. She continues to spike fever.  Oncologist recommended CT chest.   Anticipated discharge in few days  Consultants:  Oncology  Procedures: None Antimicrobials: Cefepime, Zithromax, ceftriaxone  Subjective: Patient was seen and examined at bedside.  Overnight events noted.   Patient reports feeling some better, she spiked fever in the morning today. CT chest shows finding consistent with pneumonia,  no other abnormalities.  Objective: Vitals:   09/18/22 0600 09/18/22 0700 09/18/22 0857 09/18/22 1000  BP: (!) 112/41 (!) 119/52  112/62  Pulse: 92 93  87  Resp: (!) 24 (!) 26  19  Temp: 99.4 F (37.4 C)  98.6 F (37 C)   TempSrc:   Oral   SpO2: 96% 97%  99%  Weight:        Intake/Output Summary (Last 24 hours) at 09/18/2022 1113 Last data filed at 09/18/2022 0700 Gross per 24 hour  Intake 2533.59 ml  Output --  Net 2533.59 ml   [employer]  09/15/22 2017  Weight: 91.1 kg     Examination:  General exam: Appears comfortable, not in any acute distress, deconditioned. Respiratory system: Decreased breath sounds LLL, CTA on right side.  RR 15 Cardiovascular system: S1-S2 heard, regular rate and rhythm, no murmur. Gastrointestinal system: Abdomen is soft, non tender, non distended, BS+ Central nervous system: Alert and oriented x 3. No focal neurological deficits. Extremities: No edema, no cyanosis, no clubbing. Skin: No rashes, lesions or ulcers Psychiatry: Judgement and insight appear normal. Mood & affect appropriate.     Data Reviewed: I have personally reviewed following labs and imaging studies  CBC: Recent Labs  Lab 09/15/22 1428 09/15/22 1428 09/15/22 1550 09/16/22 0500 09/16/22 1356 09/17/22 0428 09/17/22 1523 09/18/22 0332  WBC 0.2*  --  0.2* 0.3*  --  0.8*  --  1.9*  NEUTROABS 0.0*  --  0.0*  --   --   --   --   --   HGB 7.1*   < > 6.8* 6.7* 7.7* 7.9* 8.1* 7.6*  HCT 19.3*  --  20.0* 20.1* 22.1* 22.9* 24.1* 22.9*  MCV 95.1  --  99.0 99.0  --  95.4  --  93.9  PLT 29*  --  26* 20*  --  17*  --  23*   < > = values in this interval not displayed.   Basic Metabolic Panel: Recent Labs  Lab 09/15/22 1428 09/15/22 1550 09/16/22 0500 09/17/22 0428 09/18/22 0332  NA 128* 127* 135 136 136  K 2.9* 2.9* 3.5 3.2* 2.9*  CL 96* 97* 107 107 107  CO2 23 20* 22 21* 23  GLUCOSE 213* 218* 127* 90 90  BUN '9 9 9 '$ 6* <5*  CREATININE 0.80 0.92 0.53 0.62 0.53  CALCIUM 8.6* 8.2* 7.8* 8.0* 7.9*  MG  --  1.6* 2.1  --  1.8  PHOS  --   --  2.2*  --  2.2*   GFR: Estimated Creatinine Clearance: 87.7 mL/min (by C-G formula based on SCr of 0.53 mg/dL). Liver Function Tests: Recent Labs  Lab 09/15/22 1428 09/15/22 1550 09/16/22 0500  AST 10* 14* 14*  ALT '7 11 12  '$ ALKPHOS 57 50 42  BILITOT 0.9 0.9 0.5  PROT 6.7 6.3* 5.7*  ALBUMIN 3.6 3.2* 2.7*   No results for input(s): "LIPASE", "AMYLASE" in the last 168 hours. No results for input(s): "AMMONIA"  in the last 168 hours. Coagulation Profile: Recent Labs  Lab 09/16/22 0500  INR 1.2   Cardiac Enzymes: No results for input(s): "CKTOTAL", "CKMB", "CKMBINDEX", "TROPONINI" in the last 168 hours. BNP (last 3 results) No results for input(s): "PROBNP" in the last 8760 hours. HbA1C: No results for input(s): "HGBA1C" in the last 72 hours. CBG: Recent Labs  Lab 09/17/22 0831 09/17/22 1225 09/17/22 1634 09/17/22 2147 09/18/22 0829  GLUCAP 82 99 94 97 85   Lipid Profile: No results for input(s): "CHOL", "HDL", "LDLCALC", "TRIG", "CHOLHDL", "LDLDIRECT" in the last 72 hours. Thyroid Function Tests: No results for input(s): "TSH", "T4TOTAL", "FREET4", "T3FREE", "THYROIDAB" in the last 72 hours. Anemia Panel: No results for input(s): "VITAMINB12", "FOLATE", "FERRITIN", "TIBC", "IRON", "RETICCTPCT" in the last 72 hours. Sepsis Labs: Recent Labs  Lab 09/15/22 1550 09/15/22 2054 09/16/22 0500  PROCALCITON  --   --  1.56  LATICACIDVEN 1.6 1.5  --     Recent Results (from the past 240 hour(s))  Blood culture (routine x 2)     Status: Abnormal   Collection Time: 09/15/22  3:30 PM   Specimen: BLOOD  Result Value Ref Range Status   Specimen Description   Final    BLOOD PORTA CATH Performed at [facility] [address] [place], [place] [address]    Special Requests   Final    BOTTLES DRAWN AEROBIC AND ANAEROBIC Blood Culture adequate volume Performed at [facility] [address], [place], [place] [address]    Culture  Setup Time   Final    GRAM POSITIVE COCCI IN PAIRS IN BOTH AEROBIC AND ANAEROBIC BOTTLES CRITICAL VALUE NOTED.  VALUE IS CONSISTENT WITH PREVIOUSLY REPORTED AND CALLED VALUE.    Culture (A)  Final    STREPTOCOCCUS PNEUMONIAE SUSCEPTIBILITIES PERFORMED ON PREVIOUS CULTURE WITHIN THE LAST 5 DAYS. Performed at [facility], [place] [address] [place], [place] [address]    Report Status 09/18/2022 FINAL  Final  Blood  culture (routine x 2)     Status: Abnormal   Collection Time: 09/15/22  3:50 PM   Specimen: BLOOD  Result Value Ref Range Status   Specimen Description   Final    BLOOD BLOOD RIGHT ARM Performed at [facility] [address], [place], [place] [address]    Special Requests   Final    BOTTLES DRAWN AEROBIC AND ANAEROBIC Blood Culture adequate volume Performed at [facility] [address] [place], [place] [address]    Culture  Setup Time   Final    GRAM POSITIVE COCCI IN PAIRS IN BOTH AEROBIC AND ANAEROBIC BOTTLES CRITICAL RESULT CALLED TO, READ BACK BY AND VERIFIED WITH: PHARMD V. [place] 09/16/22 '@0930'$  BY AB Performed at [facility], [place] [address], [place], [place] [address]    Culture STREPTOCOCCUS PNEUMONIAE (A)  Final   Report Status 09/18/2022 FINAL  Final   Organism ID, Bacteria STREPTOCOCCUS PNEUMONIAE  Final      Susceptibility   Streptococcus pneumoniae - MIC*    ERYTHROMYCIN >=8 RESISTANT Resistant     LEVOFLOXACIN 0.5 SENSITIVE Sensitive     VANCOMYCIN 0.5 SENSITIVE Sensitive     PENICILLIN (meningitis) 1 RESISTANT Resistant     PENO - penicillin 1      PENICILLIN (non-meningitis) 1 SENSITIVE Sensitive     PENICILLIN (oral) 1 INTERMEDIATE Intermediate     CEFTRIAXONE (non-meningitis) 1 SENSITIVE Sensitive     CEFTRIAXONE (meningitis) 1 INTERMEDIATE Intermediate     * STREPTOCOCCUS PNEUMONIAE  Blood Culture ID Panel (Reflexed)     Status: Abnormal   Collection Time: 09/15/22  3:50 PM  Result Value Ref Range Status   Enterococcus faecalis NOT DETECTED NOT DETECTED Final   Enterococcus Faecium NOT DETECTED NOT DETECTED Final   Listeria monocytogenes NOT DETECTED NOT DETECTED Final   Staphylococcus species NOT DETECTED NOT DETECTED Final   Staphylococcus aureus (BCID) NOT DETECTED NOT DETECTED Final   Staphylococcus epidermidis NOT DETECTED NOT DETECTED Final   Staphylococcus lugdunensis NOT DETECTED NOT DETECTED  Final   Streptococcus species DETECTED (A) NOT DETECTED Final    Comment: CRITICAL RESULT CALLED TO, READ BACK BY AND VERIFIED WITH: PHARMD V. GLOGOVAC 09/16/22 '@0930'$  BY AB    Streptococcus agalactiae NOT DETECTED NOT DETECTED Final   Streptococcus pneumoniae DETECTED (A) NOT DETECTED Final    Comment: CRITICAL RESULT CALLED TO, READ BACK BY AND VERIFIED WITH: PHARMD V. GLOGOVAC 09/16/22 '@0930'$  BY AB    Streptococcus pyogenes NOT DETECTED NOT DETECTED Final   A.calcoaceticus-baumannii NOT DETECTED NOT DETECTED Final  Bacteroides fragilis NOT DETECTED NOT DETECTED Final   Enterobacterales NOT DETECTED NOT DETECTED Final   Enterobacter cloacae complex NOT DETECTED NOT DETECTED Final   Escherichia coli NOT DETECTED NOT DETECTED Final   Klebsiella aerogenes NOT DETECTED NOT DETECTED Final   Klebsiella oxytoca NOT DETECTED NOT DETECTED Final   Klebsiella pneumoniae NOT DETECTED NOT DETECTED Final   Proteus species NOT DETECTED NOT DETECTED Final   Salmonella species NOT DETECTED NOT DETECTED Final   Serratia marcescens NOT DETECTED NOT DETECTED Final   Haemophilus influenzae NOT DETECTED NOT DETECTED Final   Neisseria meningitidis NOT DETECTED NOT DETECTED Final   Pseudomonas aeruginosa NOT DETECTED NOT DETECTED Final   Stenotrophomonas maltophilia NOT DETECTED NOT DETECTED Final   Candida albicans NOT DETECTED NOT DETECTED Final   Candida auris NOT DETECTED NOT DETECTED Final   Candida glabrata NOT DETECTED NOT DETECTED Final   Candida krusei NOT DETECTED NOT DETECTED Final   Candida parapsilosis NOT DETECTED NOT DETECTED Final   Candida tropicalis NOT DETECTED NOT DETECTED Final   Cryptococcus neoformans/gattii NOT DETECTED NOT DETECTED Final    Comment: Performed at [facility], [place] [address], [place], [place] [address]  Resp Panel by RT-PCR (Flu A&B, Covid) Anterior Nasal Swab     Status: None   Collection Time: 09/15/22  4:23 PM   Specimen: Anterior Nasal Swab  Result  Value Ref Range Status   SARS Coronavirus 2 by RT PCR NEGATIVE NEGATIVE Final    Comment: (NOTE) SARS-CoV-2 target nucleic acids are NOT DETECTED.  The SARS-CoV-2 RNA is generally detectable in upper respiratory specimens during the acute phase of infection. The lowest concentration of SARS-CoV-2 viral copies this assay can detect is 138 copies/mL. A negative result does not preclude SARS-Cov-2 infection and should not be used as the sole basis for treatment or other patient management decisions. A negative result may occur with  improper specimen collection/handling, submission of specimen other than nasopharyngeal swab, presence of viral mutation(s) within the areas targeted by this assay, and inadequate number of viral copies(<138 copies/mL). A negative result must be combined with clinical observations, patient history, and epidemiological information. The expected result is Negative.  Fact Sheet for Patients:  EntrepreneurPulse.com.au  Fact Sheet for Healthcare Providers:  IncredibleEmployment.be  This test is no t yet approved or cleared by the [place] FDA and  has been authorized for detection and/or diagnosis of SARS-CoV-2 by FDA under an Emergency Use Authorization (EUA). This EUA will remain  in effect (meaning this test can be used) for the duration of the COVID-19 declaration under Section 564(b)(1) of the Act, 21 U.S.C.section 360bbb-3(b)(1), unless the authorization is terminated  or revoked sooner.       Influenza A by PCR NEGATIVE NEGATIVE Final   Influenza B by PCR NEGATIVE NEGATIVE Final    Comment: (NOTE) The Xpert Xpress SARS-CoV-2/FLU/RSV plus assay is intended as an aid in the diagnosis of influenza from Nasopharyngeal swab specimens and should not be used as a sole basis for treatment. Nasal washings and aspirates are unacceptable for Xpert Xpress SARS-CoV-2/FLU/RSV testing.  Fact Sheet for  Patients: EntrepreneurPulse.com.au  Fact Sheet for Healthcare Providers: IncredibleEmployment.be  This test is not yet approved or cleared by the [place] FDA and has been authorized for detection and/or diagnosis of SARS-CoV-2 by FDA under an Emergency Use Authorization (EUA). This EUA will remain in effect (meaning this test can be used) for the duration of the COVID-19 declaration under Section 564(b)(1) of the  Act, 21 U.S.C. section 360bbb-3(b)(1), unless the authorization is terminated or revoked.  Performed at [facility], [place] [address] [place], [place] [address]   MRSA Next Gen by PCR, Nasal     Status: None   Collection Time: 09/15/22  9:00 PM   Specimen: Nasal Mucosa; Nasal Swab  Result Value Ref Range Status   MRSA by PCR Next Gen NOT DETECTED NOT DETECTED Final    Comment: (NOTE) The GeneXpert MRSA Assay (FDA approved for NASAL specimens only), is one component of a comprehensive MRSA colonization surveillance program. It is not intended to diagnose MRSA infection nor to guide or monitor treatment for MRSA infections. Test performance is not FDA approved in patients less than 5 years old. Performed at [facility], [place] [address], [place], [place] [address]     Radiology Studies: CT CHEST W CONTRAST  Result Date: 09/17/2022 CLINICAL DATA:  61 year old female with history of neutropenia and fever of unknown origin. EXAM: CT CHEST WITH CONTRAST TECHNIQUE: Multidetector CT imaging of the chest was performed during intravenous contrast administration. RADIATION DOSE REDUCTION: This exam was performed according to the departmental dose-optimization program which includes automated exposure control, adjustment of the mA and/or kV according to patient size and/or use of iterative reconstruction technique. CONTRAST:  166m OMNIPAQUE IOHEXOL 300 MG/ML  SOLN COMPARISON:  No priors. FINDINGS:  Cardiovascular: Heart size is normal. There is no significant pericardial fluid, thickening or pericardial calcification. Aortic atherosclerosis. No definite coronary artery calcifications. Left subclavian single-lumen Port-A-Cath with tip terminating in the right atrium. Mediastinum/Nodes: No pathologically enlarged mediastinal or hilar lymph nodes. Prominent left hilar lymph node (axial image 62 of series 2) measuring 9 mm in short axis, nonspecific. Esophagus is unremarkable in appearance. No axillary lymphadenopathy. Lungs/Pleura: Patchy airspace consolidation in the basal segments of the left lower lobe, concerning for pneumonia. This is somewhat rounded in appearance and measures up to 4.5 x 4.1 cm (axial image 95 of series 2). Right lung is clear. No pleural effusions. Upper Abdomen: Unremarkable. Musculoskeletal: There are no aggressive appearing lytic or blastic lesions noted in the visualized portions of the skeleton. IMPRESSION: 1. Findings in the left lower lobe likely reflect a round pneumonia. Strictly speaking, however, the possibility of neoplasm is not entirely excluded, and follow-up noncontrast chest CT is recommended in 3 months to ensure complete regression of these findings. Aortic Atherosclerosis (ICD10-I70.0). Electronically Signed   By: DVinnie LangtonM.D.   On: 09/17/2022 12:22    Scheduled Meds:  sodium chloride   Intravenous Once   atorvastatin  10 mg Oral Q M,W,F   Chlorhexidine Gluconate Cloth  6 each Topical Daily   docusate sodium  100 mg Oral BID   gabapentin  100 mg Oral TID   insulin aspart  0-9 Units Subcutaneous TID WC   Ensure Max Protein  11 oz Oral BID   senna  1 tablet Oral BID   Tbo-Filgrastim  480 mcg Subcutaneous q1800   Continuous Infusions:  sodium chloride 100 mL/hr at 09/18/22 0700   cefTRIAXone (ROCEPHIN)  IV Stopped (09/17/22 1317)   potassium PHOSPHATE IVPB (in mmol) 30 mmol (09/18/22 0955)     LOS: 3 days    Time spent: 35  mins    Amias Hutchinson, MD [facility]   If 7PM-7AM, please contact night-coverage

## 2022-09-19 LAB — CBC
HCT: 23.3 % — ABNORMAL LOW (ref 36.0–46.0)
Hemoglobin: 7.7 g/dL — ABNORMAL LOW (ref 12.0–15.0)
MCH: 31.4 pg (ref 26.0–34.0)
MCHC: 33 g/dL (ref 30.0–36.0)
MCV: 95.1 fL (ref 80.0–100.0)
Platelets: 32 10*3/uL — ABNORMAL LOW (ref 150–400)
RBC: 2.45 MIL/uL — ABNORMAL LOW (ref 3.87–5.11)
RDW: 20.9 % — ABNORMAL HIGH (ref 11.5–15.5)
WBC: 5.7 10*3/uL (ref 4.0–10.5)
nRBC: 0 % (ref 0.0–0.2)

## 2022-09-19 LAB — BASIC METABOLIC PANEL
Anion gap: 5 (ref 5–15)
BUN: <5 mg/dL — ABNORMAL LOW (ref 8–23)
CO2: 23 mmol/L (ref 22–32)
Calcium: 7.9 mg/dL — ABNORMAL LOW (ref 8.9–10.3)
Chloride: 109 mmol/L (ref 98–111)
Creatinine, Ser: 0.48 mg/dL (ref 0.44–1.00)
GFR, Estimated: >60 mL/min (ref >60)
Glucose, Bld: 87 mg/dL (ref 70–99)
Potassium: 3.4 mmol/L — ABNORMAL LOW (ref 3.5–5.1)
Sodium: 137 mmol/L (ref 135–145)

## 2022-09-19 LAB — TYPE AND SCREEN
ABO/RH(D): O POS
Antibody Screen: NEGATIVE
Unit division: 0
Unit division: 0
Unit division: 0
Unit division: 0
Unit division: 0

## 2022-09-19 LAB — MAGNESIUM: Magnesium: 1.7 mg/dL (ref 1.7–2.4)

## 2022-09-19 LAB — BPAM RBC
Blood Product Expiration Date: 202311122359
Blood Product Expiration Date: 202311122359
Blood Product Expiration Date: 202311132359
Blood Product Expiration Date: 202311132359
Blood Product Expiration Date: 202311162359
ISSUE DATE / TIME: 202310122351
ISSUE DATE / TIME: 202310130826
ISSUE DATE / TIME: 202310141011
Unit Type and Rh: 5100
Unit Type and Rh: 5100
Unit Type and Rh: 5100
Unit Type and Rh: 5100
Unit Type and Rh: 5100

## 2022-09-19 LAB — GLUCOSE, CAPILLARY
Glucose-Capillary: 108 mg/dL — ABNORMAL HIGH (ref 70–99)
Glucose-Capillary: 98 mg/dL (ref 70–99)
Glucose-Capillary: 94 mg/dL (ref 70–99)
Glucose-Capillary: 71 mg/dL (ref 70–99)

## 2022-09-19 LAB — PHOSPHORUS: Phosphorus: 2.9 mg/dL (ref 2.5–4.6)

## 2022-09-19 MED ORDER — POTASSIUM CHLORIDE 20 MEQ PO PACK
40.0000 meq | PACK | Freq: Once | ORAL | Status: AC
  Administered 2022-09-19: 40 meq via ORAL
  Filled 2022-09-19: qty 2

## 2022-09-19 MED ORDER — LOPERAMIDE HCL 2 MG PO CAPS
2.0000 mg | ORAL_CAPSULE | ORAL | Status: DC | PRN
  Administered 2022-09-19: 2 mg via ORAL
  Filled 2022-09-19: qty 1

## 2022-09-19 NOTE — Progress Notes (Addendum)
PROGRESS NOTE    Sydney Lee  XBW:[id number] DOB: February 26, 1961 DOA: 09/15/2022  PCP: [name], MD   Brief Narrative:  This 61 y.o. female with PMH significant for breast cancer on chemotherapy, essential hypertension, diabetes mellitus type 2, pancytopenia, anxiety, depression who was sent to the ED from oncologist office for fever.  Patient reports feeling generalized fatigue for last 2 weeks, she has reached out to her oncologist and was advised to take 2 Tylenols and ibuprofen, Patient continued to remain fatigued and feeling unwell. She has noticed fever 103 at home today, She went to her oncologist office for routine follow up and was sent in the ED for further evaluation. Patient is admitted for severe sepsis secondary to community-acquired pneumonia.  Blood cultures positive for strep pneumo.  Assessment & Plan:   Principal Problem:   Severe sepsis with acute organ dysfunction (HCC) Active Problems:   Malignant neoplasm of upper-inner quadrant of left breast in female, estrogen receptor negative (HCC)   HTN (hypertension)   Diabetes mellitus, type II (HCC)   Anxiety   Depression   Malnutrition of moderate degree  Severe sepsis secondary to community-acquired pneumonia: Streptococcal pneumococcus bacteremia: Neutropenic fever: Patient presented with fever, tachycardia, tachypnea, hypotension requiring fluid resuscitation,  lactic acid to 1.5. Chest x-ray shows consolidation consistent with left lower lobe pneumonia. Continue IV fluid resuscitation, Initiated on IV antibiotics ( cefepime and Zithromax) Blood cultures growing Streptococcus pneumonia. Continue ceftriaxone 2 gm iv daily.  Zithromax discontinued. Continue neutropenic precautions. She continues to spike fever.  CT chest showed pneumonia but no other abnormalities. She remains afebrile for last 24 hours.  WBC improving , Sepsis physiology improving.   Streptococcal pneumococci pneumonia Continue ceftriaxone 2  g IV daily. Continue incentive spirometry, continue antitussives.   Pancytopenia: Likely secondary to chemotherapy. Hemoglobin 6.8 , s/p 2 PRBC> Hb 7.9 >7.6 Transfuse if hemoglobin below 8.   Breast cancer on chemotherapy: Patient on neoadjuvant chemotherapy,  follow-up Dr. Chryl Heck Oncology consulted Dr. Chryl Heck.  Started on Granix for 3 days.  Diabetes mellitus type 2: Hold p.o. diabetic medications Carb modified diet, regular insulin sliding scale   Essential hypertension: Continue losartan as blood pressure has improved.   Tobacco abuse: Nicotine patch offered. Smoking counseling completed   COPD: Patient does not appear in acute exacerbation. Continue home inhalers   Electrolyte abnormalities(hyponatremia, hypomagnesemia, hypokalemia): Replaced and resolved.  Hypokalemia:  Replaced.  Continue to monitor   DVT prophylaxis:  Lovenox Code Status: Full code. Family Communication:Daughter at bed side. Disposition Plan:  Status is: Inpatient Remains inpatient appropriate because: Admitted for Streptococcus pneumonia bacteremia requiring IV antibiotics. She continues to spike fever.  CT chest shows pneumonia no other abnormality.   Anticipated discharge home in 1 to 2 days.  Consultants:  Oncology  Procedures: None Antimicrobials: Cefepime, Zithromax, ceftriaxone  Subjective: Patient was seen and examined at bedside.  Overnight events noted.   Patient reports feeling much better.  She remained afebrile in last 24 hours. CT chest shows finding consistent with pneumonia,  no other abnormalities. She denies any chest pain or shortness of breath.  Objective: Vitals:   09/19/22 0900 09/19/22 1000 09/19/22 1100 09/19/22 1203  BP:  (!) 141/78  (!) 141/73  Pulse:  65  96  Resp: (!) 21 (!) 30 (!) 29 (!) 31  Temp:    98.4 F (36.9 C)  TempSrc:    Oral  SpO2: 100% 100% 100% 97%  Weight:        Intake/Output Summary (  Last 24 hours) at 09/19/2022 1345 Last data  filed at 09/19/2022 1220 Gross per 24 hour  Intake 3813.87 ml  Output --  Net 3813.87 ml   Filed Weights   09/15/22 2017  Weight: 91.1 kg    Examination:  General exam: Appears comfortable, not in any acute distress, deconditioned. Respiratory system: Decreased breath sounds on LLL, CTA on right side, RR 15 Cardiovascular system: S1-S2 heard, regular rate and rhythm, no murmur. Gastrointestinal system: Abdomen is soft, nontender, nondistended, BS+ Central nervous system: Alert and oriented x 3. No focal neurological deficits. Extremities: No edema, no cyanosis, no clubbing. Skin: No rashes, lesions or ulcers Psychiatry: Judgement and insight appear normal. Mood & affect appropriate.     Data Reviewed: I have personally reviewed following labs and imaging studies  CBC: Recent Labs  Lab 09/15/22 1428 09/15/22 1428 09/15/22 1550 09/16/22 0500 09/16/22 1356 09/17/22 0428 09/17/22 1523 09/18/22 0332 09/19/22 0533  WBC 0.2*  --  0.2* 0.3*  --  0.8*  --  1.9* 5.7  NEUTROABS 0.0*  --  0.0*  --   --   --   --   --   --   HGB 7.1*   < > 6.8* 6.7* 7.7* 7.9* 8.1* 7.6* 7.7*  HCT 19.3*  --  20.0* 20.1* 22.1* 22.9* 24.1* 22.9* 23.3*  MCV 95.1  --  99.0 99.0  --  95.4  --  93.9 95.1  PLT 29*  --  26* 20*  --  17*  --  23* 32*   < > = values in this interval not displayed.   Basic Metabolic Panel: Recent Labs  Lab 09/15/22 1550 09/16/22 0500 09/17/22 0428 09/18/22 0332 09/19/22 0533  NA 127* 135 136 136 137  K 2.9* 3.5 3.2* 2.9* 3.4*  CL 97* 107 107 107 109  CO2 20* 22 21* 23 23  GLUCOSE 218* 127* 90 90 87  BUN 9 9 6* <5* <5*  CREATININE 0.92 0.53 0.62 0.53 0.48  CALCIUM 8.2* 7.8* 8.0* 7.9* 7.9*  MG 1.6* 2.1  --  1.8 1.7  PHOS  --  2.2*  --  2.2* 2.9   GFR: Estimated Creatinine Clearance: 87.7 mL/min (by C-G formula based on SCr of 0.48 mg/dL). Liver Function Tests: Recent Labs  Lab 09/15/22 1428 09/15/22 1550 09/16/22 0500  AST 10* 14* 14*  ALT '7 11 12   '$ ALKPHOS 57 50 42  BILITOT 0.9 0.9 0.5  PROT 6.7 6.3* 5.7*  ALBUMIN 3.6 3.2* 2.7*   No results for input(s): "LIPASE", "AMYLASE" in the last 168 hours. No results for input(s): "AMMONIA" in the last 168 hours. Coagulation Profile: Recent Labs  Lab 09/16/22 0500  INR 1.2   Cardiac Enzymes: No results for input(s): "CKTOTAL", "CKMB", "CKMBINDEX", "TROPONINI" in the last 168 hours. BNP (last 3 results) No results for input(s): "PROBNP" in the last 8760 hours. HbA1C: No results for input(s): "HGBA1C" in the last 72 hours. CBG: Recent Labs  Lab 09/18/22 0829 09/18/22 1218 09/18/22 1548 09/18/22 2147 09/19/22 1206  GLUCAP 85 96 94 94 98   Lipid Profile: No results for input(s): "CHOL", "HDL", "LDLCALC", "TRIG", "CHOLHDL", "LDLDIRECT" in the last 72 hours. Thyroid Function Tests: No results for input(s): "TSH", "T4TOTAL", "FREET4", "T3FREE", "THYROIDAB" in the last 72 hours. Anemia Panel: No results for input(s): "VITAMINB12", "FOLATE", "FERRITIN", "TIBC", "IRON", "RETICCTPCT" in the last 72 hours. Sepsis Labs: Recent Labs  Lab 09/15/22 1550 09/15/22 2054 09/16/22 0500  PROCALCITON  --   --  1.46  LATICACIDVEN 1.6 1.5  --     Recent Results (from the past 240 hour(s))  Blood culture (routine x 2)     Status: Abnormal   Collection Time: 09/15/22  3:30 PM   Specimen: BLOOD  Result Value Ref Range Status   Specimen Description   Final    BLOOD PORTA CATH Performed at [facility] [address], [place], [place] [address]    Special Requests   Final    BOTTLES DRAWN AEROBIC AND ANAEROBIC Blood Culture adequate volume Performed at [facility] [address], [place], [place] [address]    Culture  Setup Time   Final    GRAM POSITIVE COCCI IN PAIRS IN BOTH AEROBIC AND ANAEROBIC BOTTLES CRITICAL VALUE NOTED.  VALUE IS CONSISTENT WITH PREVIOUSLY REPORTED AND CALLED VALUE.    Culture (A)  Final    STREPTOCOCCUS  PNEUMONIAE SUSCEPTIBILITIES PERFORMED ON PREVIOUS CULTURE WITHIN THE LAST 5 DAYS. Performed at [facility], [place] [address] [place], [place] [address]    Report Status 09/18/2022 FINAL  Final  Blood culture (routine x 2)     Status: Abnormal   Collection Time: 09/15/22  3:50 PM   Specimen: BLOOD  Result Value Ref Range Status   Specimen Description   Final    BLOOD BLOOD RIGHT ARM Performed at [facility] [address], [place], [place] [address]    Special Requests   Final    BOTTLES DRAWN AEROBIC AND ANAEROBIC Blood Culture adequate volume Performed at [facility] [address], [place], [place] [address]    Culture  Setup Time   Final    GRAM POSITIVE COCCI IN PAIRS IN BOTH AEROBIC AND ANAEROBIC BOTTLES CRITICAL RESULT CALLED TO, READ BACK BY AND VERIFIED WITH: PHARMD V. [place] 09/16/22 '@0930'$  BY AB Performed at [facility], [place] [address] [place],  [address]    Culture STREPTOCOCCUS PNEUMONIAE (A)  Final   Report Status 09/18/2022 FINAL  Final   Organism ID, Bacteria STREPTOCOCCUS PNEUMONIAE  Final      Susceptibility   Streptococcus pneumoniae - MIC*    ERYTHROMYCIN >=8 RESISTANT Resistant     LEVOFLOXACIN 0.5 SENSITIVE Sensitive     VANCOMYCIN 0.5 SENSITIVE Sensitive     PENICILLIN (meningitis) 1 RESISTANT Resistant     PENO - penicillin 1      PENICILLIN (non-meningitis) 1 SENSITIVE Sensitive     PENICILLIN (oral) 1 INTERMEDIATE Intermediate     CEFTRIAXONE (non-meningitis) 1 SENSITIVE Sensitive     CEFTRIAXONE (meningitis) 1 INTERMEDIATE Intermediate     * STREPTOCOCCUS PNEUMONIAE  Blood Culture ID Panel (Reflexed)     Status: Abnormal   Collection Time: 09/15/22  3:50 PM  Result Value Ref Range Status   Enterococcus faecalis NOT DETECTED NOT DETECTED Final   Enterococcus Faecium NOT DETECTED NOT DETECTED Final   Listeria monocytogenes NOT DETECTED NOT DETECTED Final   Staphylococcus species NOT  DETECTED NOT DETECTED Final   Staphylococcus aureus (BCID) NOT DETECTED NOT DETECTED Final   Staphylococcus epidermidis NOT DETECTED NOT DETECTED Final   Staphylococcus lugdunensis NOT DETECTED NOT DETECTED Final   Streptococcus species DETECTED (A) NOT DETECTED Final    Comment: CRITICAL RESULT CALLED TO, READ BACK BY AND VERIFIED WITH: PHARMD V. GLOGOVAC 09/16/22 '@0930'$  BY AB    Streptococcus agalactiae NOT DETECTED NOT DETECTED Final   Streptococcus pneumoniae DETECTED (A) NOT DETECTED Final    Comment: CRITICAL  RESULT CALLED TO, READ BACK BY AND VERIFIED WITH: PHARMD V. GLOGOVAC 09/16/22 '@0930'$  BY AB    Streptococcus pyogenes NOT DETECTED NOT DETECTED Final   A.calcoaceticus-baumannii NOT DETECTED NOT DETECTED Final   Bacteroides fragilis NOT DETECTED NOT DETECTED Final   Enterobacterales NOT DETECTED NOT DETECTED Final   Enterobacter cloacae complex NOT DETECTED NOT DETECTED Final   Escherichia coli NOT DETECTED NOT DETECTED Final   Klebsiella aerogenes NOT DETECTED NOT DETECTED Final   Klebsiella oxytoca NOT DETECTED NOT DETECTED Final   Klebsiella pneumoniae NOT DETECTED NOT DETECTED Final   Proteus species NOT DETECTED NOT DETECTED Final   Salmonella species NOT DETECTED NOT DETECTED Final   Serratia marcescens NOT DETECTED NOT DETECTED Final   Haemophilus influenzae NOT DETECTED NOT DETECTED Final   Neisseria meningitidis NOT DETECTED NOT DETECTED Final   Pseudomonas aeruginosa NOT DETECTED NOT DETECTED Final   Stenotrophomonas maltophilia NOT DETECTED NOT DETECTED Final   Candida albicans NOT DETECTED NOT DETECTED Final   Candida auris NOT DETECTED NOT DETECTED Final   Candida glabrata NOT DETECTED NOT DETECTED Final   Candida krusei NOT DETECTED NOT DETECTED Final   Candida parapsilosis NOT DETECTED NOT DETECTED Final   Candida tropicalis NOT DETECTED NOT DETECTED Final   Cryptococcus neoformans/gattii NOT DETECTED NOT DETECTED Final    Comment: Performed at [facility], [address], [place], [place] [address]  Resp Panel by RT-PCR (Flu A&B, Covid) Anterior Nasal Swab     Status: None   Collection Time: 09/15/22  4:23 PM   Specimen: Anterior Nasal Swab  Result Value Ref Range Status   SARS Coronavirus 2 by RT PCR NEGATIVE NEGATIVE Final    Comment: (NOTE) SARS-CoV-2 target nucleic acids are NOT DETECTED.  The SARS-CoV-2 RNA is generally detectable in upper respiratory specimens during the acute phase of infection. The lowest concentration of SARS-CoV-2 viral copies this assay can detect is 138 copies/mL. A negative result does not preclude SARS-Cov-2 infection and should not be used as the sole basis for treatment or other patient management decisions. A negative result may occur with  improper specimen collection/handling, submission of specimen other than nasopharyngeal swab, presence of viral mutation(s) within the areas targeted by this assay, and inadequate number of viral copies(<138 copies/mL). A negative result must be combined with clinical observations, patient history, and epidemiological information. The expected result is Negative.  Fact Sheet for Patients:  EntrepreneurPulse.com.au  Fact Sheet for Healthcare Providers:  IncredibleEmployment.be  This test is no t yet approved or cleared by the [place] FDA and  has been authorized for detection and/or diagnosis of SARS-CoV-2 by FDA under an Emergency Use Authorization (EUA). This EUA will remain  in effect (meaning this test can be used) for the duration of the COVID-19 declaration under Section 564(b)(1) of the Act, 21 U.S.C.section 360bbb-3(b)(1), unless the authorization is terminated  or revoked sooner.       Influenza A by PCR NEGATIVE NEGATIVE Final   Influenza B by PCR NEGATIVE NEGATIVE Final    Comment: (NOTE) The Xpert Xpress SARS-CoV-2/FLU/RSV plus assay is intended as an aid in the diagnosis of influenza from  Nasopharyngeal swab specimens and should not be used as a sole basis for treatment. Nasal washings and aspirates are unacceptable for Xpert Xpress SARS-CoV-2/FLU/RSV testing.  Fact Sheet for Patients: EntrepreneurPulse.com.au  Fact Sheet for Healthcare Providers: IncredibleEmployment.be  This test is not yet approved or cleared by the [place] FDA and has been authorized for detection  and/or diagnosis of SARS-CoV-2 by FDA under an Emergency Use Authorization (EUA). This EUA will remain in effect (meaning this test can be used) for the duration of the COVID-19 declaration under Section 564(b)(1) of the Act, 21 U.S.C. section 360bbb-3(b)(1), unless the authorization is terminated or revoked.  Performed at [facility], [place] [address] [place], [place] [address]   MRSA Next Gen by PCR, Nasal     Status: None   Collection Time: 09/15/22  9:00 PM   Specimen: Nasal Mucosa; Nasal Swab  Result Value Ref Range Status   MRSA by PCR Next Gen NOT DETECTED NOT DETECTED Final    Comment: (NOTE) The GeneXpert MRSA Assay (FDA approved for NASAL specimens only), is one component of a comprehensive MRSA colonization surveillance program. It is not intended to diagnose MRSA infection nor to guide or monitor treatment for MRSA infections. Test performance is not FDA approved in patients less than 48 years old. Performed at [facility], [place] [address] [place] [address], [place], [place] [address]     Radiology Studies: No results found.  Scheduled Meds:  sodium chloride   Intravenous Once   atorvastatin  10 mg Oral Q M,W,F   Chlorhexidine Gluconate Cloth  6 each Topical Daily   docusate sodium  100 mg Oral BID   gabapentin  100 mg Oral TID   insulin aspart  0-9 Units Subcutaneous TID WC   Ensure Max Protein  11 oz Oral BID   senna  1 tablet Oral BID   Continuous Infusions:  sodium chloride 100 mL/hr at 09/19/22 1220    cefTRIAXone (ROCEPHIN)  IV Stopped (09/18/22 1358)     LOS: 4 days    Time spent: 35 mins    Elgar Scoggins, MD [facility]   If 7PM-7AM, please contact night-coverage

## 2022-09-20 LAB — CBC
HCT: 24.4 % — ABNORMAL LOW (ref 36.0–46.0)
Hemoglobin: 8 g/dL — ABNORMAL LOW (ref 12.0–15.0)
MCH: 31.6 pg (ref 26.0–34.0)
MCHC: 32.8 g/dL (ref 30.0–36.0)
MCV: 96.4 fL (ref 80.0–100.0)
Platelets: 52 10*3/uL — ABNORMAL LOW (ref 150–400)
RBC: 2.53 MIL/uL — ABNORMAL LOW (ref 3.87–5.11)
RDW: 20.9 % — ABNORMAL HIGH (ref 11.5–15.5)
WBC: 10.5 10*3/uL (ref 4.0–10.5)
nRBC: 0 % (ref 0.0–0.2)

## 2022-09-20 LAB — BASIC METABOLIC PANEL
Anion gap: 7 (ref 5–15)
BUN: <5 mg/dL — ABNORMAL LOW (ref 8–23)
CO2: 23 mmol/L (ref 22–32)
Calcium: 8.1 mg/dL — ABNORMAL LOW (ref 8.9–10.3)
Chloride: 106 mmol/L (ref 98–111)
Creatinine, Ser: 0.61 mg/dL (ref 0.44–1.00)
GFR, Estimated: >60 mL/min (ref >60)
Glucose, Bld: 86 mg/dL (ref 70–99)
Potassium: 3.3 mmol/L — ABNORMAL LOW (ref 3.5–5.1)
Sodium: 136 mmol/L (ref 135–145)

## 2022-09-20 LAB — GLUCOSE, CAPILLARY
Glucose-Capillary: 90 mg/dL (ref 70–99)
Glucose-Capillary: 81 mg/dL (ref 70–99)
Glucose-Capillary: 121 mg/dL — ABNORMAL HIGH (ref 70–99)
Glucose-Capillary: 95 mg/dL (ref 70–99)

## 2022-09-20 MED ORDER — SODIUM CHLORIDE 0.9% FLUSH
10.0000 mL | Freq: Two times a day (BID) | INTRAVENOUS | Status: DC
  Administered 2022-09-20 – 2022-09-21 (×3): 10 mL

## 2022-09-20 MED ORDER — SODIUM CHLORIDE 0.9% FLUSH: 10.0000 mL | INTRAVENOUS | Status: DC | PRN

## 2022-09-20 MED ORDER — POTASSIUM CHLORIDE 10 MEQ/50ML IV SOLN
10.0000 meq | INTRAVENOUS | Status: AC
  Administered 2022-09-20 (×3): 10 meq via INTRAVENOUS
  Filled 2022-09-20 (×3): qty 50

## 2022-09-20 NOTE — Hospital Course (Addendum)
61 y.o. female with PMH significant for breast cancer on chemotherapy, essential hypertension, diabetes mellitus type 2, pancytopenia, anxiety, depression who is on [place], had a fever of 103 and weakness in her leg and sent to the hospital.  COVID-19 negative, influenza negative.  Blood cultures 4/4 Streptococcus pneumoniae from 10/stable sensitive to levofloxacin ceftriaxone (no meningitis)> treated with ceftriaxone 2 g every 24.  Chest x-ray with consolidation left lower pneumonia.  Echo appropriately fluid resuscitated, CT chest done that showed pneumonia but no other abnormalities.  She has had intermittent fever spikes

## 2022-09-20 NOTE — Progress Notes (Signed)
Patient arrived to unit, denies pain, call bell placed within reach. VS taken.

## 2022-09-20 NOTE — Progress Notes (Signed)
Report called to Sydney Bushy RN. All questions answered at this time. All pt belongings and paper chart transported with pt. Pt was transferred upstairs in the wheelchair by NT. Handoff completed.

## 2022-09-20 NOTE — Progress Notes (Signed)
PROGRESS NOTE Sydney Lee  XLK:[id number] DOB: [birth year] DOA: 09/15/2022 PCP: [name], MD   Brief Narrative/Hospital Course: 61 y.o. female with PMH significant for breast cancer on chemotherapy, essential hypertension, diabetes mellitus type 2, pancytopenia, anxiety, depression who is on [place], had a fever of 103 and weakness in her leg and sent to the hospital.  COVID-19 negative, influenza negative.  Blood cultures 4/4 Streptococcus pneumoniae from 10/stable sensitive to levofloxacin ceftriaxone (no meningitis)> treated with ceftriaxone 2 g every 24.  Chest x-ray with consolidation left lower pneumonia.  Echo appropriately fluid resuscitated, CT chest done that showed pneumonia but no other abnormalities.  She has had intermittent fever spikes  10/17-last temp spike 100.2 on 9pm,  10/16, respiration 2328, BP stable, labs with improving platelet count, WBC normalized, potassium 3.3  Severe sepsis due to pneumonia Streptococus pneumoniae bacteremia Commun[facility]are pneumonia Neutropenic fever: Sepsis/pneumonia in the setting of chemotherapy neutropenia.  Sepsis resolved. Hemodynamically stable still having ongoing temperature spike last spike yesterday 7 PM would wait at least 24 hours fever free before discharge.  Continue ceftriaxone 2 g, monitor fever curve, encourage incentive spirometry ambulation.  Hypokalemia: Being repleted Chemo induced pancytopenia: Resolving received 2 unit PRBC. also received Granix  Breast cancer on chemo CarboTaxol and Keytruda, followed by oncology appreciate input Follow-up outpatient  Type 2 diabetes mellitus, blood sugar stable, continue SSI 0 to 9 units and monitor HLD continue Lipitor Essential hypertension, on losartan at home, currently on hold BP stable Tobacco abuse continue nicotine patch, smoking cessation counseling COPD not in exacerbation continue inhalers as needed     Subjective: Seen and examined this morning resting  comfortably last temp spike 100.2 on 9pm,  10/16, respiration 2328, BP stable, labs with improving platelet count, WBC normalized, potassium 3.3   Assessment and Plan: Principal Problem:   Severe sepsis with acute organ dysfunction (HCC) Active Problems:   Malignant neoplasm of upper-inner quadrant of left breast in female, estrogen receptor negative (HCC)   HTN (hypertension)   Diabetes mellitus, type II (HCC)   Anxiety   Depression   Malnutrition of moderate degree   Severe sepsis due to pneumonia Streptococus pneumoniae bacteremia Communicare pneumonia Neutropenic fever: Sepsis/pneumonia in the setting of chemotherapy neutropenia.  Sepsis resolved. Hemodynamically stable still having ongoing temperature spike last spike yesterday 7 PM would wait at least 24 hours fever free before discharge.  Continue ceftriaxone 2 g, monitor fever curve, encourage incentive spirometry ambulation.  Hypokalemia: Being repleted  Chemo induced pancytopenia: Resolving received 2 unit PRBC. also received Granix Recent Labs  Lab 09/18/22 0332 09/19/22 0533 09/20/22 0531  HGB 7.6* 7.7* 8.0*  HCT 22.9* 23.3* 24.4*  WBC 1.9* 5.7 10.5  PLT 23* 32* 52*    Breast cancer on chemo CarboTaxol and Keytruda, followed by oncology appreciate input Follow-up outpatient-reports she is due for another chemo on 25th  Type 2 diabetes mellitus, blood sugar stable, continue SSI 0 to 9 units and monitor Recent Labs  Lab 09/19/22 0805 09/19/22 1206 09/19/22 1646 09/19/22 2130 09/20/22 0829  GLUCAP 108* 98 94 71 90    HLD continue Lipitor Essential hypertension, on losartan at home, currently on hold BP stable Tobacco abuse continue nicotine patch, smoking cessation counseling COPD not in exacerbation continue inhalers as needed  DVT prophylaxis: SCDs Start: 09/15/22 1731 Code Status:   Code Status: Full Code Family Communication: plan of care discussed with patient at bedside. Patient status is:  Inpatient because of sepsis Level of care: Stepdown  Dispo: The patient is from: Home            Anticipated disposition: Home in next 1 to 2 days if remains afebrile and stable Can downgrade to tele if remains afebrile.   Mobility Assessment (last 72 hours)     Mobility Assessment   No documentation.            Objective: Vitals last 24 hrs: Vitals:   09/20/22 0700 09/20/22 0800 09/20/22 0804 09/20/22 0900  BP: 114/63  (!) 143/74 122/72  Pulse: 89 76  82  Resp: (!) 27 (!) 28 (!) 29 (!) 23  Temp:  98.1 F (36.7 C)    TempSrc:  Axillary    SpO2: 96% 100% 97% 98%  Weight:       Weight change:   Physical Examination: General exam: alert awake, older than stated age HEENT:Oral mucosa moist, Ear/Nose WNL grossly Respiratory system: bilaterally clear BS, no use of accessory muscle Cardiovascular system: S1 & S2 +, No JVD. Gastrointestinal system: Abdomen soft,NT,ND, BS+ Nervous System:Alert, awake, moving extremities. Extremities: LE edema neg,distal peripheral pulses palpable.  Skin: No rashes,no icterus. MSK: Normal muscle bulk,tone, power  Medications reviewed:  Scheduled Meds:  sodium chloride   Intravenous Once   atorvastatin  10 mg Oral Q M,W,F   Chlorhexidine Gluconate Cloth  6 each Topical Daily   docusate sodium  100 mg Oral BID   gabapentin  100 mg Oral TID   insulin aspart  0-9 Units Subcutaneous TID WC   Ensure Max Protein  11 oz Oral BID   senna  1 tablet Oral BID   sodium chloride flush  10-40 mL Intracatheter Q12H   Continuous Infusions:  sodium chloride Stopped (09/20/22 0810)   cefTRIAXone (ROCEPHIN)  IV Stopped (09/19/22 1436)   potassium chloride 50 mL/hr at 09/20/22 0904      Diet Order             Diet Carb Modified Fluid consistency: Thin; Room service appropriate? Yes  Diet effective now                    Nutrition Problem: Moderate Malnutrition Etiology: chronic illness Signs/Symptoms: energy intake < or equal to 75%  for > or equal to 1 month, mild fat depletion, mild muscle depletion, percent weight loss Percent weight loss: 6.8 % Interventions: Other (Comment) (Ensure Max, extra condiments on trays.)   Intake/Output Summary (Last 24 hours) at 09/20/2022 0913 Last data filed at 09/20/2022 6440 Gross per 24 hour  Intake 2694.95 ml  Output --  Net 2694.95 ml   Net IO Since Admission: 16,262.6 mL [09/20/22 0913]  Wt Readings from Last 3 Encounters:  09/15/22 91.1 kg  09/15/22 89.8 kg  08/31/22 94.1 kg     Unresulted Labs (From admission, onward)     Start     Ordered   09/21/22 3474  Basic metabolic panel  Tomorrow morning,   R       Question:  Specimen collection method  Answer:  Unit=Unit collect   09/20/22 0806   09/21/22 0500  CBC  Tomorrow morning,   R       Question:  Specimen collection method  Answer:  Unit=Unit collect   09/20/22 0806   09/15/22 1557  Urine Culture  Once,   URGENT       Question:  Indication  Answer:  Sepsis   09/15/22 1557          Data Reviewed: I have personally  reviewed following labs and imaging studies CBC: Recent Labs  Lab 09/15/22 1428 09/15/22 1428 09/15/22 1550 09/16/22 0500 09/16/22 1356 09/17/22 0428 09/17/22 1523 09/18/22 0332 09/19/22 0533 09/20/22 0531  WBC 0.2*   < > 0.2* 0.3*  --  0.8*  --  1.9* 5.7 10.5  NEUTROABS 0.0*  --  0.0*  --   --   --   --   --   --   --   HGB 7.1*   < > 6.8* 6.7*   < > 7.9* 8.1* 7.6* 7.7* 8.0*  HCT 19.3*  --  20.0* 20.1*   < > 22.9* 24.1* 22.9* 23.3* 24.4*  MCV 95.1  --  99.0 99.0  --  95.4  --  93.9 95.1 96.4  PLT 29*   < > 26* 20*  --  17*  --  23* 32* 52*   < > = values in this interval not displayed.   Basic Metabolic Panel: Recent Labs  Lab 09/15/22 1550 09/16/22 0500 09/17/22 0428 09/18/22 0332 09/19/22 0533 09/20/22 0531  NA 127* 135 136 136 137 136  K 2.9* 3.5 3.2* 2.9* 3.4* 3.3*  CL 97* 107 107 107 109 106  CO2 20* 22 21* '23 23 23  '$ GLUCOSE 218* 127* 90 90 87 86  BUN 9 9 6* <5* <5*  <5*  CREATININE 0.92 0.53 0.62 0.53 0.48 0.61  CALCIUM 8.2* 7.8* 8.0* 7.9* 7.9* 8.1*  MG 1.6* 2.1  --  1.8 1.7  --   PHOS  --  2.2*  --  2.2* 2.9  --    GFR: Estimated Creatinine Clearance: 87.7 mL/min (by C-G formula based on SCr of 0.61 mg/dL). Liver Function Tests: Recent Labs  Lab 09/15/22 1428 09/15/22 1550 09/16/22 0500  AST 10* 14* 14*  ALT '7 11 12  '$ ALKPHOS 57 50 42  BILITOT 0.9 0.9 0.5  PROT 6.7 6.3* 5.7*  ALBUMIN 3.6 3.2* 2.7*   No results for input(s): "LIPASE", "AMYLASE" in the last 168 hours. No results for input(s): "AMMONIA" in the last 168 hours. Coagulation Profile: Recent Labs  Lab 09/16/22 0500  INR 1.2   BNP (last 3 results) No results for input(s): "PROBNP" in the last 8760 hours. HbA1C: No results for input(s): "HGBA1C" in the last 72 hours. CBG: Recent Labs  Lab 09/19/22 0805 09/19/22 1206 09/19/22 1646 09/19/22 2130 09/20/22 0829  GLUCAP 108* 98 94 71 90   Lipid Profile: No results for input(s): "CHOL", "HDL", "LDLCALC", "TRIG", "CHOLHDL", "LDLDIRECT" in the last 72 hours. Thyroid Function Tests: No results for input(s): "TSH", "T4TOTAL", "FREET4", "T3FREE", "THYROIDAB" in the last 72 hours. Sepsis Labs: Recent Labs  Lab 09/15/22 1550 09/15/22 2054 09/16/22 0500  PROCALCITON  --   --  1.56  LATICACIDVEN 1.6 1.5  --     Recent Results (from the past 240 hour(s))  Blood culture (routine x 2)     Status: Abnormal   Collection Time: 09/15/22  3:30 PM   Specimen: BLOOD  Result Value Ref Range Status   Specimen Description   Final    BLOOD PORTA CATH Performed at [facility] [address], [place], [place] [address]    Special Requests   Final    BOTTLES DRAWN AEROBIC AND ANAEROBIC Blood Culture adequate volume Performed at [facility] [address], [place], [place] [address]    Culture  Setup Time   Final    GRAM POSITIVE COCCI IN PAIRS IN BOTH AEROBIC  AND ANAEROBIC BOTTLES CRITICAL  VALUE NOTED.  VALUE IS CONSISTENT WITH PREVIOUSLY REPORTED AND CALLED VALUE.    Culture (A)  Final    STREPTOCOCCUS PNEUMONIAE SUSCEPTIBILITIES PERFORMED ON PREVIOUS CULTURE WITHIN THE LAST 5 DAYS. Performed at [facility], [place] [address], [place], [place] [address]    Report Status 09/18/2022 FINAL  Final  Blood culture (routine x 2)     Status: Abnormal   Collection Time: 09/15/22  3:50 PM   Specimen: BLOOD  Result Value Ref Range Status   Specimen Description   Final    BLOOD BLOOD RIGHT ARM Performed at [facility] [address], [place], [place] [address]    Special Requests   Final    BOTTLES DRAWN AEROBIC AND ANAEROBIC Blood Culture adequate volume Performed at [facility] [address] [place], [place] [address]    Culture  Setup Time   Final    GRAM POSITIVE COCCI IN PAIRS IN BOTH AEROBIC AND ANAEROBIC BOTTLES CRITICAL RESULT CALLED TO, READ BACK BY AND VERIFIED WITH: PHARMD V. Ambler 09/16/22 '@0930'$  BY AB Performed at [facility], [place] [address], [place], [place] [address]    Culture STREPTOCOCCUS PNEUMONIAE (A)  Final   Report Status 09/18/2022 FINAL  Final   Organism ID, Bacteria STREPTOCOCCUS PNEUMONIAE  Final      Susceptibility   Streptococcus pneumoniae - MIC*    ERYTHROMYCIN >=8 RESISTANT Resistant     LEVOFLOXACIN 0.5 SENSITIVE Sensitive     VANCOMYCIN 0.5 SENSITIVE Sensitive     PENICILLIN (meningitis) 1 RESISTANT Resistant     PENO - penicillin 1      PENICILLIN (non-meningitis) 1 SENSITIVE Sensitive     PENICILLIN (oral) 1 INTERMEDIATE Intermediate     CEFTRIAXONE (non-meningitis) 1 SENSITIVE Sensitive     CEFTRIAXONE (meningitis) 1 INTERMEDIATE Intermediate     * STREPTOCOCCUS PNEUMONIAE  Blood Culture ID Panel (Reflexed)     Status: Abnormal   Collection Time: 09/15/22  3:50 PM  Result Value Ref Range Status   Enterococcus faecalis NOT DETECTED NOT DETECTED Final   Enterococcus Faecium NOT  DETECTED NOT DETECTED Final   Listeria monocytogenes NOT DETECTED NOT DETECTED Final   Staphylococcus species NOT DETECTED NOT DETECTED Final   Staphylococcus aureus (BCID) NOT DETECTED NOT DETECTED Final   Staphylococcus epidermidis NOT DETECTED NOT DETECTED Final   Staphylococcus lugdunensis NOT DETECTED NOT DETECTED Final   Streptococcus species DETECTED (A) NOT DETECTED Final    Comment: CRITICAL RESULT CALLED TO, READ BACK BY AND VERIFIED WITH: PHARMD V. GLOGOVAC 09/16/22 '@0930'$  BY AB    Streptococcus agalactiae NOT DETECTED NOT DETECTED Final   Streptococcus pneumoniae DETECTED (A) NOT DETECTED Final    Comment: CRITICAL RESULT CALLED TO, READ BACK BY AND VERIFIED WITH: PHARMD V. GLOGOVAC 09/16/22 '@0930'$  BY AB    Streptococcus pyogenes NOT DETECTED NOT DETECTED Final   A.calcoaceticus-baumannii NOT DETECTED NOT DETECTED Final   Bacteroides fragilis NOT DETECTED NOT DETECTED Final   Enterobacterales NOT DETECTED NOT DETECTED Final   Enterobacter cloacae complex NOT DETECTED NOT DETECTED Final   Escherichia coli NOT DETECTED NOT DETECTED Final   Klebsiella aerogenes NOT DETECTED NOT DETECTED Final   Klebsiella oxytoca NOT DETECTED NOT DETECTED Final   Klebsiella pneumoniae NOT DETECTED NOT DETECTED Final   Proteus species NOT DETECTED NOT DETECTED Final   Salmonella species NOT DETECTED NOT DETECTED Final   Serratia marcescens NOT DETECTED NOT DETECTED Final   Haemophilus influenzae  NOT DETECTED NOT DETECTED Final   Neisseria meningitidis NOT DETECTED NOT DETECTED Final   Pseudomonas aeruginosa NOT DETECTED NOT DETECTED Final   Stenotrophomonas maltophilia NOT DETECTED NOT DETECTED Final   Candida albicans NOT DETECTED NOT DETECTED Final   Candida auris NOT DETECTED NOT DETECTED Final   Candida glabrata NOT DETECTED NOT DETECTED Final   Candida krusei NOT DETECTED NOT DETECTED Final   Candida parapsilosis NOT DETECTED NOT DETECTED Final   Candida tropicalis NOT DETECTED NOT  DETECTED Final   Cryptococcus neoformans/gattii NOT DETECTED NOT DETECTED Final    Comment: Performed at [facility], [place] [address], [place], [place] [address]  Resp Panel by RT-PCR (Flu A&B, Covid) Anterior Nasal Swab     Status: None   Collection Time: 09/15/22  4:23 PM   Specimen: Anterior Nasal Swab  Result Value Ref Range Status   SARS Coronavirus 2 by RT PCR NEGATIVE NEGATIVE Final    Comment: (NOTE) SARS-CoV-2 target nucleic acids are NOT DETECTED.  The SARS-CoV-2 RNA is generally detectable in upper respiratory specimens during the acute phase of infection. The lowest concentration of SARS-CoV-2 viral copies this assay can detect is 138 copies/mL. A negative result does not preclude SARS-Cov-2 infection and should not be used as the sole basis for treatment or other patient management decisions. A negative result may occur with  improper specimen collection/handling, submission of specimen other than nasopharyngeal swab, presence of viral mutation(s) within the areas targeted by this assay, and inadequate number of viral copies(<138 copies/mL). A negative result must be combined with clinical observations, patient history, and epidemiological information. The expected result is Negative.  Fact Sheet for Patients:  EntrepreneurPulse.com.au  Fact Sheet for Healthcare Providers:  IncredibleEmployment.be  This test is no t yet approved or cleared by the [place] FDA and  has been authorized for detection and/or diagnosis of SARS-CoV-2 by FDA under an Emergency Use Authorization (EUA). This EUA will remain  in effect (meaning this test can be used) for the duration of the COVID-19 declaration under Section 564(b)(1) of the Act, 21 U.S.C.section 360bbb-3(b)(1), unless the authorization is terminated  or revoked sooner.       Influenza A by PCR NEGATIVE NEGATIVE Final   Influenza B by PCR NEGATIVE NEGATIVE Final    Comment:  (NOTE) The Xpert Xpress SARS-CoV-2/FLU/RSV plus assay is intended as an aid in the diagnosis of influenza from Nasopharyngeal swab specimens and should not be used as a sole basis for treatment. Nasal washings and aspirates are unacceptable for Xpert Xpress SARS-CoV-2/FLU/RSV testing.  Fact Sheet for Patients: EntrepreneurPulse.com.au  Fact Sheet for Healthcare Providers: IncredibleEmployment.be  This test is not yet approved or cleared by the [place] FDA and has been authorized for detection and/or diagnosis of SARS-CoV-2 by FDA under an Emergency Use Authorization (EUA). This EUA will remain in effect (meaning this test can be used) for the duration of the COVID-19 declaration under Section 564(b)(1) of the Act, 21 U.S.C. section 360bbb-3(b)(1), unless the authorization is terminated or revoked.  Performed at [facility], [place] [address] [place],  [address]   MRSA Next Gen by PCR, Nasal     Status: None   Collection Time: 09/15/22  9:00 PM   Specimen: Nasal Mucosa; Nasal Swab  Result Value Ref Range Status   MRSA by PCR Next Gen NOT DETECTED NOT DETECTED Final    Comment: (NOTE) The GeneXpert MRSA Assay (FDA approved for NASAL specimens only), is one component of  a comprehensive MRSA colonization surveillance program. It is not intended to diagnose MRSA infection nor to guide or monitor treatment for MRSA infections. Test performance is not FDA approved in patients less than 39 years old. Performed at [facility], [place] [address] [place], [place] [address]     Antimicrobials: Anti-infectives (From admission, onward)    Start     Dose/Rate Route Frequency Ordered Stop   09/16/22 1400  cefTRIAXone (ROCEPHIN) 2 g in sodium chloride 0.9 % 100 mL IVPB        2 g 200 mL/hr over 30 Minutes Intravenous Every 24 hours 09/16/22 1003     09/15/22 2200  ceFEPIme (MAXIPIME) 2 g in sodium  chloride 0.9 % 100 mL IVPB  Status:  Discontinued        2 g 200 mL/hr over 30 Minutes Intravenous Every 8 hours 09/15/22 1940 09/16/22 1001   09/15/22 1800  azithromycin (ZITHROMAX) 500 mg in sodium chloride 0.9 % 250 mL IVPB  Status:  Discontinued        500 mg 250 mL/hr over 60 Minutes Intravenous Every 24 hours 09/15/22 1734 09/16/22 1001   09/15/22 1630  ceFEPIme (MAXIPIME) 2 g in sodium chloride 0.9 % 100 mL IVPB        2 g 200 mL/hr over 30 Minutes Intravenous  Once 09/15/22 1628 09/15/22 1732   09/15/22 1630  azithromycin (ZITHROMAX) 500 mg in sodium chloride 0.9 % 250 mL IVPB  Status:  Discontinued        500 mg 250 mL/hr over 60 Minutes Intravenous  Once 09/15/22 1628 09/15/22 1738      Culture/Microbiology    Component Value Date/Time   SDES  09/15/2022 1550    BLOOD BLOOD RIGHT ARM Performed at [facility], [place] [address], [place], [place] [address]    SPECREQUEST  09/15/2022 1550    BOTTLES DRAWN AEROBIC AND ANAEROBIC Blood Culture adequate volume Performed at [facility], [place] [address] [place],  [address]    CULT STREPTOCOCCUS PNEUMONIAE (A) 09/15/2022 1550   REPTSTATUS 09/18/2022 FINAL 09/15/2022 1550    Other culture-see note  Radiology Studies: No results found.   LOS: 5 days   Antonieta Pert, MD [facility]  09/20/2022, 9:13 AM

## 2022-09-21 ENCOUNTER — Telehealth: Payer: Self-pay | Admitting: *Deleted

## 2022-09-21 LAB — BASIC METABOLIC PANEL
Anion gap: 8 (ref 5–15)
BUN: <5 mg/dL — ABNORMAL LOW (ref 8–23)
CO2: 24 mmol/L (ref 22–32)
Calcium: 8 mg/dL — ABNORMAL LOW (ref 8.9–10.3)
Chloride: 107 mmol/L (ref 98–111)
Creatinine, Ser: 0.43 mg/dL — ABNORMAL LOW (ref 0.44–1.00)
GFR, Estimated: >60 mL/min (ref >60)
Glucose, Bld: 100 mg/dL — ABNORMAL HIGH (ref 70–99)
Potassium: 3.3 mmol/L — ABNORMAL LOW (ref 3.5–5.1)
Sodium: 139 mmol/L (ref 135–145)

## 2022-09-21 LAB — GLUCOSE, CAPILLARY
Glucose-Capillary: 94 mg/dL (ref 70–99)
Glucose-Capillary: 93 mg/dL (ref 70–99)

## 2022-09-21 LAB — CBC
HCT: 24.1 % — ABNORMAL LOW (ref 36.0–46.0)
Hemoglobin: 7.9 g/dL — ABNORMAL LOW (ref 12.0–15.0)
MCH: 31.6 pg (ref 26.0–34.0)
MCHC: 32.8 g/dL (ref 30.0–36.0)
MCV: 96.4 fL (ref 80.0–100.0)
Platelets: 63 10*3/uL — ABNORMAL LOW (ref 150–400)
RBC: 2.5 MIL/uL — ABNORMAL LOW (ref 3.87–5.11)
RDW: 20.7 % — ABNORMAL HIGH (ref 11.5–15.5)
WBC: 11.3 10*3/uL — ABNORMAL HIGH (ref 4.0–10.5)
nRBC: 0.2 % (ref 0.0–0.2)

## 2022-09-21 MED ORDER — HEPARIN SOD (PORK) LOCK FLUSH 100 UNIT/ML IV SOLN
500.0000 [IU] | INTRAVENOUS | Status: AC | PRN
  Administered 2022-09-21: 500 [IU]

## 2022-09-21 MED ORDER — FLUCONAZOLE 100 MG PO TABS: 100.0000 mg | ORAL_TABLET | Freq: Every day | ORAL | 0 refills | Status: AC

## 2022-09-21 MED ORDER — ENSURE MAX PROTEIN PO LIQD: 11.0000 [oz_av] | Freq: Two times a day (BID) | ORAL | 0 refills | Status: AC

## 2022-09-21 MED ORDER — CEFADROXIL 500 MG PO CAPS: 500.0000 mg | ORAL_CAPSULE | Freq: Two times a day (BID) | ORAL | 0 refills | Status: AC

## 2022-09-21 MED ORDER — POTASSIUM CHLORIDE CRYS ER 20 MEQ PO TBCR
40.0000 meq | EXTENDED_RELEASE_TABLET | Freq: Once | ORAL | Status: AC
  Administered 2022-09-21: 40 meq via ORAL
  Filled 2022-09-21: qty 2

## 2022-09-21 NOTE — Plan of Care (Signed)

## 2022-09-21 NOTE — Progress Notes (Signed)
Patient discharged. PIV removed. Port de-accessed. Belongings were returned. AVS went over with patient and she stated she had no further questions. Patient was wheeled down to the front.

## 2022-09-21 NOTE — Progress Notes (Signed)
Mobility Specialist - Progress Note   09/21/22 1402  Mobility  Activity Ambulated with assistance in hallway  Level of Assistance Contact guard assist, steadying assist  Assistive Device Other (Comment) (IV pole)  Distance Ambulated (ft) 500 ft  Activity Response Tolerated well  Mobility Referral Yes  $Mobility charge 1 Mobility   Pt received in bed and agreed for mobility, no c/o pain nor discomfort. Pt back to bed with all needs met and staff in room.   Roderick Pee Mobility Specialist

## 2022-09-21 NOTE — Discharge Summary (Signed)
Physician Discharge Summary  Sydney Lee KPT:[id number] DOB: October 11, 1961 DOA: 09/15/2022  PCP: [name], MD  Admit date: 09/15/2022 Discharge date: 09/21/2022 Recommendations for Outpatient Follow-up:  Follow up with PCP in 1 weeks-call for appointment Please obtain BMP/CBC in one week  Discharge Dispo: Home Discharge Condition: Stable Code Status:   Code Status: Full Code Diet recommendation:  Diet Order             Diet Carb Modified Fluid consistency: Thin; Room service appropriate? Yes  Diet effective now                    Brief/Interim Summary: 61 y.o. female with PMH significant for breast cancer on chemotherapy, essential hypertension, diabetes mellitus type 2, pancytopenia, anxiety, depression who is on [place], had a fever of 103 and weakness in her leg and sent to the hospital.  COVID-19 negative, influenza negative.  Blood cultures 4/4 Streptococcus pneumoniae from 10/stable sensitive to levofloxacin ceftriaxone (no meningitis)> treated with ceftriaxone 2 g every 24.  Chest x-ray with consolidation left lower pneumonia.  Echo appropriately fluid resuscitated, CT chest done that showed pneumonia but no other abnormalities.  She has had intermittent fever spikes  At this time she has remained afebrile since 10/16, labs improved, feels overall better. Ambulating well without need for oxygen.  Has been afebrile for more than 24 hours Discussed with her oncologist and okay for discharge home today.  Discharge Diagnoses:  Principal Problem:   Severe sepsis with acute organ dysfunction (HCC) Active Problems:   Malignant neoplasm of upper-inner quadrant of left breast in female, estrogen receptor negative (HCC)   HTN (hypertension)   Diabetes mellitus, type II (HCC)   Anxiety   Depression   Malnutrition of moderate degree  Severe sepsis due to pneumonia Streptococus pneumoniae bacteremia Communicare pneumonia Neutropenic fever: Sepsis/pneumonia  in the setting of chemotherapy neutropenia.  Sepsis resolved. Hemodynamically stable, although having some cough afebrile since 10/16.  Discussed with ID okay to discharge on to receive for couple more days at 7 days will be enough.  Treated with ceftriaxone 2 g.    Hypokalemia:repleted Chemo induced pancytopenia: Counts much improved received 2 units PRBC and Granix.   Recent Labs  Lab 09/19/22 0533 09/20/22 0531 09/21/22 0310  HGB 7.7* 8.0* 7.9*  HCT 23.3* 24.4* 24.1*  WBC 5.7 10.5 11.3*  PLT 32* 52* 63*     Breast cancer on chemo CarboTaxol and Keytruda, followed by oncology appreciate input Follow-up outpatient-as per daughter's request to check with her oncologist and okay for discharge.   Type 2 diabetes mellitus, blood sugar stable resume home meds upon discharge HLD continue Lipitor Essential hypertension, on losartan at home, resume at home Tobacco abuse continue nicotine patch, smoking cessation counseling COPD not in exacerbation continue inhalers as needed Consults: Oncology, ID over the phone Subjective: Alert awake oriented resting comfortably no new complaints  Discharge Exam: Vitals:   09/21/22 0534 09/21/22 1327  BP: 105/71 125/67  Pulse: 93 84  Resp: 14 18  Temp: 97.6 F (36.4 C) 97.9 F (36.6 C)  SpO2: 94% 98%   General: Pt is alert, awake, not in acute distress Cardiovascular: RRR, S1/S2 +, no rubs, no gallops Respiratory: CTA bilaterally, no wheezing, no rhonchi Abdominal: Soft, NT, ND, bowel sounds + Extremities: no edema, no cyanosis  Discharge Instructions  Discharge Instructions     Discharge instructions   Complete by: As directed    Follow-up with the PCP in a week.  Follow-up with your oncologist as scheduled on Monday  Please call call MD or return to ER for similar or worsening recurring problem that brought you to hospital or if any fever,nausea/vomiting,abdominal pain, uncontrolled pain, chest pain,  shortness of breath or any other  alarming symptoms.  Please follow-up your doctor as instructed in a week time and call the office for appointment.  Please avoid alcohol, smoking, or any other illicit substance and maintain healthy habits including taking your regular medications as prescribed.  You were cared for by a hospitalist during your hospital stay. If you have any questions about your discharge medications or the care you received while you were in the hospital after you are discharged, you can call the unit and ask to speak with the hospitalist on call if the hospitalist that took care of you is not available.  Once you are discharged, your primary care physician will handle any further medical issues. Please note that NO REFILLS for any discharge medications will be authorized once you are discharged, as it is imperative that you return to your primary care physician (or establish a relationship with a primary care physician if you do not have one) for your aftercare needs so that they can reassess your need for medications and monitor your lab values   Increase activity slowly   Complete by: As directed       Allergies as of 09/21/2022       Reactions   Emend [aprepitant] Shortness Of Breath, Other (See Comments)   Flushing and shortness of breath    Codeine Nausea Only, Other (See Comments)   "Sick on the stomach"   Latex Itching, Other (See Comments)   Gloves=itching/burning        Medication List     TAKE these medications    acetaminophen 500 MG tablet Commonly known as: TYLENOL Take 1,000 mg by mouth See admin instructions. Take 1,000 mg by mouth one to four times a day for pain   ALPRAZolam 0.25 MG tablet Commonly known as: XANAX Take 0.25 mg by mouth 2 (two) times daily as needed for anxiety.   atorvastatin 10 MG tablet Commonly known as: LIPITOR Take 10 mg by mouth every Monday, Wednesday, and Friday.   cefadroxil 500 MG capsule Commonly known as: DURICEF Take 1 capsule (500 mg  total) by mouth 2 (two) times daily for 3 days.   dexamethasone 4 MG tablet Commonly known as: DECADRON Take 2 tablets once a day for 3 days after carboplatin and AC chemotherapy. Take with food. What changed:  how much to take how to take this when to take this additional instructions   Ensure Max Protein Liqd Take 330 mLs (11 oz total) by mouth 2 (two) times daily for 14 days.   Ibuprofen 200 MG Caps Take 400 mg by mouth See admin instructions. Take 400 mg by mouth one to four times a day for pain   Jardiance 10 MG Tabs tablet Generic drug: empagliflozin Take 10 mg by mouth in the morning.   lidocaine-prilocaine cream Commonly known as: EMLA Apply to affected area once What changed:  how much to take how to take this when to take this additional instructions   losartan 100 MG tablet Commonly known as: COZAAR Take 100 mg by mouth in the morning.   meclizine 25 MG tablet Commonly known as: ANTIVERT Take 25 mg by mouth 3 (three) times daily as needed for dizziness.   metFORMIN 1000 MG tablet Commonly known as: GLUCOPHAGE  Take 1,000 mg by mouth in the morning and at bedtime.   NICODERM CQ TD Place 1 patch onto the skin daily as needed (for smoking cessation).   nicotine 14 mg/24hr patch Commonly known as: NICODERM CQ - dosed in mg/24 hours Place 14 mg onto the skin daily as needed for smoking cessation.   ondansetron 8 MG tablet Commonly known as: Zofran Take 1 tablet (8 mg total) by mouth 2 (two) times daily as needed. Start on the third day after carboplatin and AC chemotherapy.   oxyCODONE 5 MG immediate release tablet Commonly known as: Oxy IR/ROXICODONE Take 1 tablet (5 mg total) by mouth every 6 (six) hours as needed for severe pain.   Ozempic (1 MG/DOSE) 4 MG/3ML Sopn Generic drug: Semaglutide (1 MG/DOSE) Inject 1 mg into the skin every Wednesday.   prochlorperazine 10 MG tablet Commonly known as: COMPAZINE Take 1 tablet (10 mg total) by mouth every  6 (six) hours as needed (Nausea or vomiting). What changed: reasons to take this        Follow-up Information     [name], MD Follow up in 1 week(s).   Specialty: Family Medicine Contact information: [place] [address] [phone]         Benay Pike, MD Follow up in 1 week(s).   Specialty: Hematology and Oncology Contact information: [address] [place] [address] [phone]                Allergies  Allergen Reactions   Emend [Aprepitant] Shortness Of Breath and Other (See Comments)    Flushing and shortness of breath    Codeine Nausea Only and Other (See Comments)    "Sick on the stomach"   Latex Itching and Other (See Comments)    Gloves=itching/burning    The results of significant diagnostics from this hospitalization (including imaging, microbiology, ancillary and laboratory) are listed below for reference.    Microbiology: Recent Results (from the past 240 hour(s))  Blood culture (routine x 2)     Status: Abnormal   Collection Time: 09/15/22  3:30 PM   Specimen: BLOOD  Result Value Ref Range Status   Specimen Description   Final    BLOOD PORTA CATH Performed at [facility] [address] [place], [place] [address]    Special Requests   Final    BOTTLES DRAWN AEROBIC AND ANAEROBIC Blood Culture adequate volume Performed at [facility] [address] [place], [place] [address]    Culture  Setup Time   Final    GRAM POSITIVE COCCI IN PAIRS IN BOTH AEROBIC AND ANAEROBIC BOTTLES CRITICAL VALUE NOTED.  VALUE IS CONSISTENT WITH PREVIOUSLY REPORTED AND CALLED VALUE.    Culture (A)  Final    STREPTOCOCCUS PNEUMONIAE SUSCEPTIBILITIES PERFORMED ON PREVIOUS CULTURE WITHIN THE LAST 5 DAYS. Performed at [facility], [place] [address] [place], [place] [address]    Report Status 09/18/2022 FINAL  Final  Blood culture (routine x 2)     Status: Abnormal   Collection  Time: 09/15/22  3:50 PM   Specimen: BLOOD  Result Value Ref Range Status   Specimen Description   Final    BLOOD BLOOD RIGHT ARM Performed at [facility] [address] [employer], [place] [address]    Special Requests   Final    BOTTLES DRAWN AEROBIC AND ANAEROBIC Blood Culture adequate volume Performed at [facility], [address] [place], [place] [address]    Culture  Setup Time   Final    GRAM POSITIVE COCCI IN PAIRS IN BOTH AEROBIC AND ANAEROBIC BOTTLES CRITICAL RESULT CALLED TO, READ BACK BY AND VERIFIED WITH: PHARMD V. [place] 09/16/22 '@0930'$  BY AB Performed at [facility], [place] [address] [place], [place] [address]    Culture STREPTOCOCCUS PNEUMONIAE (A)  Final   Report Status 09/18/2022 FINAL  Final   Organism ID, Bacteria STREPTOCOCCUS PNEUMONIAE  Final      Susceptibility   Streptococcus pneumoniae - MIC*    ERYTHROMYCIN >=8 RESISTANT Resistant     LEVOFLOXACIN 0.5 SENSITIVE Sensitive     VANCOMYCIN 0.5 SENSITIVE Sensitive     PENICILLIN (meningitis) 1 RESISTANT Resistant     PENO - penicillin 1      PENICILLIN (non-meningitis) 1 SENSITIVE Sensitive     PENICILLIN (oral) 1 INTERMEDIATE Intermediate     CEFTRIAXONE (non-meningitis) 1 SENSITIVE Sensitive     CEFTRIAXONE (meningitis) 1 INTERMEDIATE Intermediate     * STREPTOCOCCUS PNEUMONIAE  Blood Culture ID Panel (Reflexed)     Status: Abnormal   Collection Time: 09/15/22  3:50 PM  Result Value Ref Range Status   Enterococcus faecalis NOT DETECTED NOT DETECTED Final   Enterococcus Faecium NOT DETECTED NOT DETECTED Final   Listeria monocytogenes NOT DETECTED NOT DETECTED Final   Staphylococcus species NOT DETECTED NOT DETECTED Final   Staphylococcus aureus (BCID) NOT DETECTED NOT DETECTED Final   Staphylococcus epidermidis NOT DETECTED NOT DETECTED Final   Staphylococcus lugdunensis NOT DETECTED NOT DETECTED Final   Streptococcus species DETECTED (A) NOT DETECTED  Final    Comment: CRITICAL RESULT CALLED TO, READ BACK BY AND VERIFIED WITH: PHARMD V. GLOGOVAC 09/16/22 '@0930'$  BY AB    Streptococcus agalactiae NOT DETECTED NOT DETECTED Final   Streptococcus pneumoniae DETECTED (A) NOT DETECTED Final    Comment: CRITICAL RESULT CALLED TO, READ BACK BY AND VERIFIED WITH: PHARMD V. GLOGOVAC 09/16/22 '@0930'$  BY AB    Streptococcus pyogenes NOT DETECTED NOT DETECTED Final   A.calcoaceticus-baumannii NOT DETECTED NOT DETECTED Final   Bacteroides fragilis NOT DETECTED NOT DETECTED Final   Enterobacterales NOT DETECTED NOT DETECTED Final   Enterobacter cloacae complex NOT DETECTED NOT DETECTED Final   Escherichia coli NOT DETECTED NOT DETECTED Final   Klebsiella aerogenes NOT DETECTED NOT DETECTED Final   Klebsiella oxytoca NOT DETECTED NOT DETECTED Final   Klebsiella pneumoniae NOT DETECTED NOT DETECTED Final   Proteus species NOT DETECTED NOT DETECTED Final   Salmonella species NOT DETECTED NOT DETECTED Final   Serratia marcescens NOT DETECTED NOT DETECTED Final   Haemophilus influenzae NOT DETECTED NOT DETECTED Final   Neisseria meningitidis NOT DETECTED NOT DETECTED Final   Pseudomonas aeruginosa NOT DETECTED NOT DETECTED Final   Stenotrophomonas maltophilia NOT DETECTED NOT DETECTED Final   Candida albicans NOT DETECTED NOT DETECTED Final   Candida auris NOT DETECTED NOT DETECTED Final   Candida glabrata NOT DETECTED NOT DETECTED Final   Candida krusei NOT DETECTED NOT DETECTED Final   Candida parapsilosis NOT DETECTED NOT DETECTED Final   Candida tropicalis NOT DETECTED NOT DETECTED Final   Cryptococcus neoformans/gattii NOT DETECTED NOT DETECTED Final    Comment: Performed at [facility], [address] [place], [place] [address]  Resp Panel by RT-PCR (Flu A&B, Covid) Anterior Nasal Swab     Status: None   Collection Time: 09/15/22  4:23 PM   Specimen: Anterior Nasal Swab  Result  Value Ref Range Status   SARS Coronavirus 2 by RT PCR  NEGATIVE NEGATIVE Final    Comment: (NOTE) SARS-CoV-2 target nucleic acids are NOT DETECTED.  The SARS-CoV-2 RNA is generally detectable in upper respiratory specimens during the acute phase of infection. The lowest concentration of SARS-CoV-2 viral copies this assay can detect is 138 copies/mL. A negative result does not preclude SARS-Cov-2 infection and should not be used as the sole basis for treatment or other patient management decisions. A negative result may occur with  improper specimen collection/handling, submission of specimen other than nasopharyngeal swab, presence of viral mutation(s) within the areas targeted by this assay, and inadequate number of viral copies(<138 copies/mL). A negative result must be combined with clinical observations, patient history, and epidemiological information. The expected result is Negative.  Fact Sheet for Patients:  EntrepreneurPulse.com.au  Fact Sheet for Healthcare Providers:  IncredibleEmployment.be  This test is no t yet approved or cleared by the [place] FDA and  has been authorized for detection and/or diagnosis of SARS-CoV-2 by FDA under an Emergency Use Authorization (EUA). This EUA will remain  in effect (meaning this test can be used) for the duration of the COVID-19 declaration under Section 564(b)(1) of the Act, 21 U.S.C.section 360bbb-3(b)(1), unless the authorization is terminated  or revoked sooner.       Influenza A by PCR NEGATIVE NEGATIVE Final   Influenza B by PCR NEGATIVE NEGATIVE Final    Comment: (NOTE) The Xpert Xpress SARS-CoV-2/FLU/RSV plus assay is intended as an aid in the diagnosis of influenza from Nasopharyngeal swab specimens and should not be used as a sole basis for treatment. Nasal washings and aspirates are unacceptable for Xpert Xpress SARS-CoV-2/FLU/RSV testing.  Fact Sheet for Patients: EntrepreneurPulse.com.au  Fact Sheet for  Healthcare Providers: IncredibleEmployment.be  This test is not yet approved or cleared by the [place] FDA and has been authorized for detection and/or diagnosis of SARS-CoV-2 by FDA under an Emergency Use Authorization (EUA). This EUA will remain in effect (meaning this test can be used) for the duration of the COVID-19 declaration under Section 564(b)(1) of the Act, 21 U.S.C. section 360bbb-3(b)(1), unless the authorization is terminated or revoked.  Performed at [facility], [place] [address], [place], [place] [address]   MRSA Next Gen by PCR, Nasal     Status: None   Collection Time: 09/15/22  9:00 PM   Specimen: Nasal Mucosa; Nasal Swab  Result Value Ref Range Status   MRSA by PCR Next Gen NOT DETECTED NOT DETECTED Final    Comment: (NOTE) The GeneXpert MRSA Assay (FDA approved for NASAL specimens only), is one component of a comprehensive MRSA colonization surveillance program. It is not intended to diagnose MRSA infection nor to guide or monitor treatment for MRSA infections. Test performance is not FDA approved in patients less than 70 years old. Performed at [facility], [place] [address] [place], [place] [address]     Procedures/Studies: CT CHEST W CONTRAST  Result Date: 09/17/2022 CLINICAL DATA:  61 year old female with history of neutropenia and fever of unknown origin. EXAM: CT CHEST WITH CONTRAST TECHNIQUE: Multidetector CT imaging of the chest was performed during intravenous contrast administration. RADIATION DOSE REDUCTION: This exam was performed according to the departmental dose-optimization program which includes automated exposure control, adjustment of the mA and/or kV according to patient size and/or use of iterative reconstruction technique. CONTRAST:  166m OMNIPAQUE IOHEXOL 300 MG/ML  SOLN COMPARISON:  No priors. FINDINGS: Cardiovascular: Heart size  is normal. There is no significant pericardial  fluid, thickening or pericardial calcification. Aortic atherosclerosis. No definite coronary artery calcifications. Left subclavian single-lumen Port-A-Cath with tip terminating in the right atrium. Mediastinum/Nodes: No pathologically enlarged mediastinal or hilar lymph nodes. Prominent left hilar lymph node (axial image 62 of series 2) measuring 9 mm in short axis, nonspecific. Esophagus is unremarkable in appearance. No axillary lymphadenopathy. Lungs/Pleura: Patchy airspace consolidation in the basal segments of the left lower lobe, concerning for pneumonia. This is somewhat rounded in appearance and measures up to 4.5 x 4.1 cm (axial image 95 of series 2). Right lung is clear. No pleural effusions. Upper Abdomen: Unremarkable. Musculoskeletal: There are no aggressive appearing lytic or blastic lesions noted in the visualized portions of the skeleton. IMPRESSION: 1. Findings in the left lower lobe likely reflect a round pneumonia. Strictly speaking, however, the possibility of neoplasm is not entirely excluded, and follow-up noncontrast chest CT is recommended in 3 months to ensure complete regression of these findings. Aortic Atherosclerosis (ICD10-I70.0). Electronically Signed   By: Vinnie Langton M.D.   On: 09/17/2022 12:22   DG Chest 2 View  Result Date: 09/15/2022 CLINICAL DATA:  Fever, crackles heard in left lower lung fields EXAM: CHEST - 2 VIEW COMPARISON:  Previous studies including the examination of 06/01/2022 FINDINGS: Cardiac size is within normal limits. There are no signs of pulmonary edema. New small patchy infiltrate is seen in left lower lung field. There is no pleural effusion or pneumothorax. Tip of left subclavian chest port is seen in the region of right atrium. IMPRESSION: There is new patchy infiltrate in left lower lung field in retrocardiac region suggesting atelectasis/pneumonia in left lower lobe. Electronically Signed   By: Elmer Picker M.D.   On: 09/15/2022 16:18     Labs: BNP (last 3 results) Recent Labs    09/15/22 2054  BNP 40.9   Basic Metabolic Panel: Recent Labs  Lab 09/15/22 1550 09/16/22 0500 09/17/22 0428 09/18/22 0332 09/19/22 0533 09/20/22 0531 09/21/22 0310  NA 127* 135 136 136 137 136 139  K 2.9* 3.5 3.2* 2.9* 3.4* 3.3* 3.3*  CL 97* 107 107 107 109 106 107  CO2 20* 22 21* '23 23 23 24  '$ GLUCOSE 218* 127* 90 90 87 86 100*  BUN 9 9 6* <5* <5* <5* <5*  CREATININE 0.92 0.53 0.62 0.53 0.48 0.61 0.43*  CALCIUM 8.2* 7.8* 8.0* 7.9* 7.9* 8.1* 8.0*  MG 1.6* 2.1  --  1.8 1.7  --   --   PHOS  --  2.2*  --  2.2* 2.9  --   --    Liver Function Tests: Recent Labs  Lab 09/15/22 1428 09/15/22 1550 09/16/22 0500  AST 10* 14* 14*  ALT '7 11 12  '$ ALKPHOS 57 50 42  BILITOT 0.9 0.9 0.5  PROT 6.7 6.3* 5.7*  ALBUMIN 3.6 3.2* 2.7*   No results for input(s): "LIPASE", "AMYLASE" in the last 168 hours. No results for input(s): "AMMONIA" in the last 168 hours. CBC: Recent Labs  Lab 09/15/22 1428 09/15/22 1550 09/16/22 0500 09/17/22 0428 09/17/22 1523 09/18/22 0332 09/19/22 0533 09/20/22 0531 09/21/22 0310  WBC 0.2* 0.2*   < > 0.8*  --  1.9* 5.7 10.5 11.3*  NEUTROABS 0.0* 0.0*  --   --   --   --   --   --   --   HGB 7.1* 6.8*   < > 7.9* 8.1* 7.6* 7.7* 8.0* 7.9*  HCT 19.3* 20.0*   < >  22.9* 24.1* 22.9* 23.3* 24.4* 24.1*  MCV 95.1 99.0   < > 95.4  --  93.9 95.1 96.4 96.4  PLT 29* 26*   < > 17*  --  23* 32* 52* 63*   < > = values in this interval not displayed.   Cardiac Enzymes: No results for input(s): "CKTOTAL", "CKMB", "CKMBINDEX", "TROPONINI" in the last 168 hours. BNP: Invalid input(s): "POCBNP" CBG: Recent Labs  Lab 09/20/22 1230 09/20/22 1633 09/20/22 2152 09/21/22 0756 09/21/22 1136  GLUCAP 81 121* 95 94 93   D-Dimer No results for input(s): "DDIMER" in the last 72 hours. Hgb A1c No results for input(s): "HGBA1C" in the last 72 hours. Lipid Profile No results for input(s): "CHOL", "HDL", "LDLCALC", "TRIG",  "CHOLHDL", "LDLDIRECT" in the last 72 hours. Thyroid function studies No results for input(s): "TSH", "T4TOTAL", "T3FREE", "THYROIDAB" in the last 72 hours.  Invalid input(s): "FREET3" Anemia work up No results for input(s): "VITAMINB12", "FOLATE", "FERRITIN", "TIBC", "IRON", "RETICCTPCT" in the last 72 hours. Urinalysis    Component Value Date/Time   COLORURINE YELLOW 09/17/2022 0428   APPEARANCEUR CLEAR 09/17/2022 0428   LABSPEC 1.016 09/17/2022 0428   PHURINE 5.0 09/17/2022 0428   GLUCOSEU NEGATIVE 09/17/2022 0428   HGBUR SMALL (A) 09/17/2022 0428   BILIRUBINUR NEGATIVE 09/17/2022 0428   KETONESUR NEGATIVE 09/17/2022 0428   PROTEINUR 30 (A) 09/17/2022 0428   NITRITE NEGATIVE 09/17/2022 0428   LEUKOCYTESUR NEGATIVE 09/17/2022 0428   Sepsis Labs Recent Labs  Lab 09/18/22 0332 09/19/22 0533 09/20/22 0531 09/21/22 0310  WBC 1.9* 5.7 10.5 11.3*   Microbiology Recent Results (from the past 240 hour(s))  Blood culture (routine x 2)     Status: Abnormal   Collection Time: 09/15/22  3:30 PM   Specimen: BLOOD  Result Value Ref Range Status   Specimen Description   Final    BLOOD PORTA CATH Performed at [facility], [place] [address] [place], [place] [address]    Special Requests   Final    BOTTLES DRAWN AEROBIC AND ANAEROBIC Blood Culture adequate volume Performed at [facility] [address] [place], [place] [address]    Culture  Setup Time   Final    GRAM POSITIVE COCCI IN PAIRS IN BOTH AEROBIC AND ANAEROBIC BOTTLES CRITICAL VALUE NOTED.  VALUE IS CONSISTENT WITH PREVIOUSLY REPORTED AND CALLED VALUE.    Culture (A)  Final    STREPTOCOCCUS PNEUMONIAE SUSCEPTIBILITIES PERFORMED ON PREVIOUS CULTURE WITHIN THE LAST 5 DAYS. Performed at [facility], [place] [address] [place], [place] [address]    Report Status 09/18/2022 FINAL  Final  Blood culture (routine x 2)     Status: Abnormal   Collection Time: 09/15/22  3:50 PM    Specimen: BLOOD  Result Value Ref Range Status   Specimen Description   Final    BLOOD BLOOD RIGHT ARM Performed at [facility] [address], [place], [place] [address]    Special Requests   Final    BOTTLES DRAWN AEROBIC AND ANAEROBIC Blood Culture adequate volume Performed at [facility] [address] [place], [place] [address]    Culture  Setup Time   Final    GRAM POSITIVE COCCI IN PAIRS IN BOTH AEROBIC AND ANAEROBIC BOTTLES CRITICAL RESULT CALLED TO, READ BACK BY AND VERIFIED WITH: PHARMD V. [place] 09/16/22 '@0930'$  BY AB Performed at [facility], [place] [address] [place], [place] [address]    Culture STREPTOCOCCUS  PNEUMONIAE (A)  Final   Report Status 09/18/2022 FINAL  Final   Organism ID, Bacteria STREPTOCOCCUS PNEUMONIAE  Final      Susceptibility   Streptococcus pneumoniae - MIC*    ERYTHROMYCIN >=8 RESISTANT Resistant     LEVOFLOXACIN 0.5 SENSITIVE Sensitive     VANCOMYCIN 0.5 SENSITIVE Sensitive     PENICILLIN (meningitis) 1 RESISTANT Resistant     PENO - penicillin 1      PENICILLIN (non-meningitis) 1 SENSITIVE Sensitive     PENICILLIN (oral) 1 INTERMEDIATE Intermediate     CEFTRIAXONE (non-meningitis) 1 SENSITIVE Sensitive     CEFTRIAXONE (meningitis) 1 INTERMEDIATE Intermediate     * STREPTOCOCCUS PNEUMONIAE  Blood Culture ID Panel (Reflexed)     Status: Abnormal   Collection Time: 09/15/22  3:50 PM  Result Value Ref Range Status   Enterococcus faecalis NOT DETECTED NOT DETECTED Final   Enterococcus Faecium NOT DETECTED NOT DETECTED Final   Listeria monocytogenes NOT DETECTED NOT DETECTED Final   Staphylococcus species NOT DETECTED NOT DETECTED Final   Staphylococcus aureus (BCID) NOT DETECTED NOT DETECTED Final   Staphylococcus epidermidis NOT DETECTED NOT DETECTED Final   Staphylococcus lugdunensis NOT DETECTED NOT DETECTED Final   Streptococcus species DETECTED (A) NOT DETECTED Final    Comment: CRITICAL  RESULT CALLED TO, READ BACK BY AND VERIFIED WITH: PHARMD V. GLOGOVAC 09/16/22 '@0930'$  BY AB    Streptococcus agalactiae NOT DETECTED NOT DETECTED Final   Streptococcus pneumoniae DETECTED (A) NOT DETECTED Final    Comment: CRITICAL RESULT CALLED TO, READ BACK BY AND VERIFIED WITH: PHARMD V. GLOGOVAC 09/16/22 '@0930'$  BY AB    Streptococcus pyogenes NOT DETECTED NOT DETECTED Final   A.calcoaceticus-baumannii NOT DETECTED NOT DETECTED Final   Bacteroides fragilis NOT DETECTED NOT DETECTED Final   Enterobacterales NOT DETECTED NOT DETECTED Final   Enterobacter cloacae complex NOT DETECTED NOT DETECTED Final   Escherichia coli NOT DETECTED NOT DETECTED Final   Klebsiella aerogenes NOT DETECTED NOT DETECTED Final   Klebsiella oxytoca NOT DETECTED NOT DETECTED Final   Klebsiella pneumoniae NOT DETECTED NOT DETECTED Final   Proteus species NOT DETECTED NOT DETECTED Final   Salmonella species NOT DETECTED NOT DETECTED Final   Serratia marcescens NOT DETECTED NOT DETECTED Final   Haemophilus influenzae NOT DETECTED NOT DETECTED Final   Neisseria meningitidis NOT DETECTED NOT DETECTED Final   Pseudomonas aeruginosa NOT DETECTED NOT DETECTED Final   Stenotrophomonas maltophilia NOT DETECTED NOT DETECTED Final   Candida albicans NOT DETECTED NOT DETECTED Final   Candida auris NOT DETECTED NOT DETECTED Final   Candida glabrata NOT DETECTED NOT DETECTED Final   Candida krusei NOT DETECTED NOT DETECTED Final   Candida parapsilosis NOT DETECTED NOT DETECTED Final   Candida tropicalis NOT DETECTED NOT DETECTED Final   Cryptococcus neoformans/gattii NOT DETECTED NOT DETECTED Final    Comment: Performed at [facility], [address] [place], [place] [address]  Resp Panel by RT-PCR (Flu A&B, Covid) Anterior Nasal Swab     Status: None   Collection Time: 09/15/22  4:23 PM   Specimen: Anterior Nasal Swab  Result Value Ref Range Status   SARS Coronavirus 2 by RT PCR NEGATIVE NEGATIVE Final     Comment: (NOTE) SARS-CoV-2 target nucleic acids are NOT DETECTED.  The SARS-CoV-2 RNA is generally detectable in upper respiratory specimens during the acute phase of infection. The lowest concentration of SARS-CoV-2 viral copies this assay can detect is 138 copies/mL. A negative result does not preclude  SARS-Cov-2 infection and should not be used as the sole basis for treatment or other patient management decisions. A negative result may occur with  improper specimen collection/handling, submission of specimen other than nasopharyngeal swab, presence of viral mutation(s) within the areas targeted by this assay, and inadequate number of viral copies(<138 copies/mL). A negative result must be combined with clinical observations, patient history, and epidemiological information. The expected result is Negative.  Fact Sheet for Patients:  EntrepreneurPulse.com.au  Fact Sheet for Healthcare Providers:  IncredibleEmployment.be  This test is no t yet approved or cleared by the [place] FDA and  has been authorized for detection and/or diagnosis of SARS-CoV-2 by FDA under an Emergency Use Authorization (EUA). This EUA will remain  in effect (meaning this test can be used) for the duration of the COVID-19 declaration under Section 564(b)(1) of the Act, 21 U.S.C.section 360bbb-3(b)(1), unless the authorization is terminated  or revoked sooner.       Influenza A by PCR NEGATIVE NEGATIVE Final   Influenza B by PCR NEGATIVE NEGATIVE Final    Comment: (NOTE) The Xpert Xpress SARS-CoV-2/FLU/RSV plus assay is intended as an aid in the diagnosis of influenza from Nasopharyngeal swab specimens and should not be used as a sole basis for treatment. Nasal washings and aspirates are unacceptable for Xpert Xpress SARS-CoV-2/FLU/RSV testing.  Fact Sheet for Patients: EntrepreneurPulse.com.au  Fact Sheet for Healthcare  Providers: IncredibleEmployment.be  This test is not yet approved or cleared by the [place] FDA and has been authorized for detection and/or diagnosis of SARS-CoV-2 by FDA under an Emergency Use Authorization (EUA). This EUA will remain in effect (meaning this test can be used) for the duration of the COVID-19 declaration under Section 564(b)(1) of the Act, 21 U.S.C. section 360bbb-3(b)(1), unless the authorization is terminated or revoked.  Performed at [facility], [place] [address] [place], [place] [address]   MRSA Next Gen by PCR, Nasal     Status: None   Collection Time: 09/15/22  9:00 PM   Specimen: Nasal Mucosa; Nasal Swab  Result Value Ref Range Status   MRSA by PCR Next Gen NOT DETECTED NOT DETECTED Final    Comment: (NOTE) The GeneXpert MRSA Assay (FDA approved for NASAL specimens only), is one component of a comprehensive MRSA colonization surveillance program. It is not intended to diagnose MRSA infection nor to guide or monitor treatment for MRSA infections. Test performance is not FDA approved in patients less than 35 years old. Performed at [facility], [place] [address] [place], [place] [address]      Time coordinating discharge: 25 minutes  SIGNED: Antonieta Pert, MD  [facility] 09/21/2022, 2:11 PM  If 7PM-7AM, please contact night-coverage [email or web address]

## 2022-09-21 NOTE — Telephone Encounter (Signed)
Connected with Xia Stohr Clawson's daughter Brycelyn Gambino [phone]) about FMLA.  "My job has not yet received my form.  If fax was not addressed to anyone's attention, they are discarded.  My claim manager is no longer there.  Fax again to [employer], attention Laneta Simmers to fax number [phone]." Successfully resent form as requested.

## 2022-09-28 ENCOUNTER — Other Ambulatory Visit: Payer: Self-pay | Admitting: *Deleted

## 2022-09-28 ENCOUNTER — Ambulatory Visit: Payer: BC Managed Care – PPO

## 2022-09-28 ENCOUNTER — Inpatient Hospital Stay: Payer: BC Managed Care – PPO

## 2022-09-28 ENCOUNTER — Inpatient Hospital Stay (HOSPITAL_BASED_OUTPATIENT_CLINIC_OR_DEPARTMENT_OTHER): Payer: BC Managed Care – PPO | Admitting: Hematology and Oncology

## 2022-09-28 VITALS — BP 148/81 | HR 102 | Temp 97.7°F | Resp 16 | Ht 69.0 in | Wt 198.2 lb

## 2022-09-28 DIAGNOSIS — G629 Polyneuropathy, unspecified: Secondary | ICD-10-CM | POA: Diagnosis not present

## 2022-09-28 DIAGNOSIS — C50212 Malignant neoplasm of upper-inner quadrant of left female breast: Secondary | ICD-10-CM | POA: Diagnosis not present

## 2022-09-28 DIAGNOSIS — Z171 Estrogen receptor negative status [ER-]: Secondary | ICD-10-CM

## 2022-09-28 DIAGNOSIS — Z5112 Encounter for antineoplastic immunotherapy: Secondary | ICD-10-CM | POA: Diagnosis not present

## 2022-09-28 DIAGNOSIS — Z5111 Encounter for antineoplastic chemotherapy: Secondary | ICD-10-CM | POA: Diagnosis present

## 2022-09-28 DIAGNOSIS — Z79899 Other long term (current) drug therapy: Secondary | ICD-10-CM | POA: Diagnosis not present

## 2022-09-28 DIAGNOSIS — F4321 Adjustment disorder with depressed mood: Secondary | ICD-10-CM | POA: Diagnosis not present

## 2022-09-28 DIAGNOSIS — R21 Rash and other nonspecific skin eruption: Secondary | ICD-10-CM | POA: Diagnosis not present

## 2022-09-28 LAB — CMP (CANCER CENTER ONLY)
ALT: 13 U/L (ref 0–44)
AST: 15 U/L (ref 15–41)
Albumin: 3.4 g/dL — ABNORMAL LOW (ref 3.5–5.0)
Alkaline Phosphatase: 75 U/L (ref 38–126)
Anion gap: 7 (ref 5–15)
BUN: <5 mg/dL — ABNORMAL LOW (ref 8–23)
CO2: 30 mmol/L (ref 22–32)
Calcium: 8.8 mg/dL — ABNORMAL LOW (ref 8.9–10.3)
Chloride: 106 mmol/L (ref 98–111)
Creatinine: 0.56 mg/dL (ref 0.44–1.00)
GFR, Estimated: >60 mL/min (ref >60)
Glucose, Bld: 116 mg/dL — ABNORMAL HIGH (ref 70–99)
Potassium: 3.3 mmol/L — ABNORMAL LOW (ref 3.5–5.1)
Sodium: 143 mmol/L (ref 135–145)
Total Bilirubin: 0.3 mg/dL (ref 0.3–1.2)
Total Protein: 7 g/dL (ref 6.5–8.1)

## 2022-09-28 LAB — CBC WITH DIFFERENTIAL (CANCER CENTER ONLY)
Abs Immature Granulocytes: 0.06 10*3/uL (ref 0.00–0.07)
Basophils Absolute: 0.1 10*3/uL (ref 0.0–0.1)
Basophils Relative: 1 %
Eosinophils Absolute: 0 10*3/uL (ref 0.0–0.5)
Eosinophils Relative: 0 %
HCT: 30.5 % — ABNORMAL LOW (ref 36.0–46.0)
Hemoglobin: 9.9 g/dL — ABNORMAL LOW (ref 12.0–15.0)
Immature Granulocytes: 1 %
Lymphocytes Relative: 22 %
Lymphs Abs: 1.7 10*3/uL (ref 0.7–4.0)
MCH: 31.7 pg (ref 26.0–34.0)
MCHC: 32.5 g/dL (ref 30.0–36.0)
MCV: 97.8 fL (ref 80.0–100.0)
Monocytes Absolute: 1 10*3/uL (ref 0.1–1.0)
Monocytes Relative: 12 %
Neutro Abs: 5.2 10*3/uL (ref 1.7–7.7)
Neutrophils Relative %: 64 %
Platelet Count: 301 10*3/uL (ref 150–400)
RBC: 3.12 MIL/uL — ABNORMAL LOW (ref 3.87–5.11)
RDW: 20.7 % — ABNORMAL HIGH (ref 11.5–15.5)
Smear Review: NORMAL
WBC Count: 8 10*3/uL (ref 4.0–10.5)
nRBC: 0 % (ref 0.0–0.2)

## 2022-09-28 LAB — TSH: TSH: 0.735 u[IU]/mL (ref 0.350–4.500)

## 2022-09-28 MED ORDER — GABAPENTIN 300 MG PO CAPS: 300.0000 mg | ORAL_CAPSULE | Freq: Every day | ORAL | 1 refills | Status: DC

## 2022-09-28 NOTE — Progress Notes (Signed)
[place] up:    Sydney Lee, [address] [place] [address]   DIAGNOSIS:  Cancer Staging  Malignant neoplasm of upper-inner quadrant of left breast in female, estrogen receptor negative ([place]) Staging form: Breast, AJCC 8th Edition - Clinical: Stage IIB (cT2, cN0, cM0, G3, ER-, PR-, HER2-) - Signed by Benay Pike, MD on 05/25/2022 Stage prefix: Initial diagnosis Histologic grading system: 3 grade system   SUMMARY OF ONCOLOGIC HISTORY: Oncology History  Malignant neoplasm of upper-inner quadrant of left breast in female, estrogen receptor negative ([place])  04/27/2022 Mammogram   Diagnostic mammogram showed indeterminate solid mass in the 10:00 location of the left breast.  Small satellite nodule adjacent to the index mass.  Borderline lymph node has cortical thickening of 2.9 mm.  Other lymph nodes have normal morphology.   05/16/2022 Pathology Results   Left breast needle core biopsy showed invasive poorly differentiated adenocarcinoma, grade 3 with tumor necrosis, lymph node biopsy benign reactive, negative for carcinoma.  Prognostics from the tumor showed ER 0%, negative, PR 0%, negative, Ki-67 of 80% and HER2 negative   05/23/2022 Initial Diagnosis   Malignant neoplasm of upper-inner quadrant of left breast in female, estrogen receptor negative ([place])   05/25/2022 Cancer Staging   Staging form: Breast, AJCC 8th Edition - Clinical: Stage IIB (cT2, cN0, cM0, G3, ER-, PR-, HER2-) - Signed by Benay Pike, MD on 05/25/2022 Stage prefix: Initial diagnosis Histologic grading system: 3 grade system   05/25/2022 Genetic Testing   [facility] CancerNext-Expanded Panel was Negative. Report date was 06/02/2022.  The CancerNext-Expanded gene panel offered by [facility] and includes sequencing, rearrangement, and RNA analysis for the following 77 genes: AIP, ALK, APC, ATM, AXIN2, BAP1, BARD1, BLM, BMPR1A, BRCA1, BRCA2, BRIP1, CDC73, CDH1, CDK4, CDKN1B,  CDKN2A, CHEK2, CTNNA1, DICER1, FANCC, FH, FLCN, GALNT12, KIF1B, LZTR1, MAX, MEN1, MET, MLH1, MSH2, MSH3, MSH6, MUTYH, NBN, NF1, NF2, NTHL1, PALB2, PHOX2B, PMS2, POT1, PRKAR1A, PTCH1, PTEN, RAD51C, RAD51D, RB1, RECQL, RET, SDHA, SDHAF2, SDHB, SDHC, SDHD, SMAD4, SMARCA4, SMARCB1, SMARCE1, STK11, SUFU, TMEM127, TP53, TSC1, TSC2, VHL and XRCC2 (sequencing and deletion/duplication); EGFR, EGLN1, HOXB13, KIT, MITF, PDGFRA, POLD1, and POLE (sequencing only); EPCAM and GREM1 (deletion/duplication only).    06/02/2022 Initial Biopsy   Additional left axillary node biopsy   06/06/2022 - 08/02/2022 Chemotherapy   Patient is on Treatment Plan : BREAST Pembrolizumab (200) D1 + Carboplatin (5) D1 + Paclitaxel (80) D1,8,15 q21d X 4 cycles / Pembrolizumab (200) D1 + AC D1 q21d x 4 cycles     06/06/2022 -  Chemotherapy   Patient is on Treatment Plan : BREAST Pembrolizumab (200) D1 + Carboplatin (5) D1 + Paclitaxel (80) D1,8,15 q21d X 4 cycles / Pembrolizumab (200) D1 + AC D1 q21d x 4 cycles       CURRENT THERAPY: Taxol/Carbo/Keytruda  INTERVAL HISTORY:  Sydney Lee 61 y.o. female returns for follow-up and evaluation prior to cycle 6 of day 1 of CarboTaxol Keytruda. Since last visit with me she was admitted and treated for Streptococcus pneumoniae with positive blood cultures.  She was also cytopenic during the hospitalization and required transfusion. She continues to feel very fatigued, very discouraged. She tells me that all of a sudden her hands and feet feel very numb.  She took forever to but did not put sugar today.  She is overall very frustrated because she is very much used to having things done by herself but now she has to ask for  help.  Her daughter was partly on FaceTime today and then via a regular call.  She was tearful today, wonders if this is her mood disorder especially since she lost her parents recently and since her husband had amputation and she had to deal with a lot of things on her  plate. Rest of the pertinent 10 point ROS reviewed and negative   Patient Active Problem List   Diagnosis Date Noted   Malnutrition of moderate degree 09/17/2022   Severe sepsis with acute organ dysfunction ([place]) 09/15/2022   HTN (hypertension) 09/15/2022   Diabetes mellitus, type II ([place]) 09/15/2022   Anxiety 09/15/2022   Depression 09/15/2022   Port-A-Cath in place 06/06/2022   Infusion reaction 06/06/2022   Genetic testing 06/03/2022   Family history of breast cancer 05/25/2022   Family history of ovarian cancer 05/25/2022   Malignant neoplasm of upper-inner quadrant of left breast in female, estrogen receptor negative ([place]) 05/23/2022    is allergic to [facility] [aprepitant], codeine, and latex.  MEDICAL HISTORY: Past Medical History:  Diagnosis Date   Anxiety    Arthritis    Breast cancer ([place])    Depression    Diabetes mellitus without complication ([place])    Hypertension     SURGICAL HISTORY: Past Surgical History:  Procedure Laterality Date   PORTACATH PLACEMENT N/A 06/01/2022   Procedure: INSERTION PORT-A-CATH WITH ULTRASOUND GUIDANCE;  Surgeon: Stark Klein, MD;  Location: [facility];  Service: General;  Laterality: N/A;    SOCIAL HISTORY: Social History   Socioeconomic History   Marital status: Married    Spouse name: Not on file   Number of children: Not on file   Years of education: Not on file   Highest education level: Not on file  Occupational History   Not on file  Tobacco Use   Smoking status: Former    Packs/day: 1.00    Years: 40.00    Total pack years: 40.00    Types: Cigarettes    Quit date: 06/05/2022    Years since quitting: 0.3   Smokeless tobacco: Never  Substance and Sexual Activity   Alcohol use: No   Drug use: No   Sexual activity: Not on file  Other Topics Concern   Not on file  Social History Narrative   Not on file   Social Determinants of Health   Financial Resource Strain: High Risk (06/14/2022)   Overall Financial Resource  Strain (CARDIA)    Difficulty of Paying Living Expenses: Hard  Food Insecurity: No Food Insecurity (09/15/2022)   Hunger Vital Sign    Worried About Running Out of Food in the Last Year: Never true    Ran Out of Food in the Last Year: Never true  Transportation Needs: No Transportation Needs (09/15/2022)   PRAPARE - Hydrologist (Medical): No    Lack of Transportation (Non-Medical): No  Physical Activity: Not on file  Stress: Not on file  Social Connections: Not on file  Intimate Partner Violence: Not At Risk (09/16/2022)   Humiliation, Afraid, Rape, and Kick questionnaire    Fear of Current or Ex-Partner: No    Emotionally Abused: No    Physically Abused: No    Sexually Abused: No    FAMILY HISTORY: Family History  Problem Relation Age of Onset   Leukemia Mother 61   Throat cancer Maternal Uncle 85   Prostate cancer Maternal Uncle    Breast cancer Cousin 30  maternal first cousin   Ovarian cancer Cousin        maternal first cousin   Breast cancer Cousin 53 - 36       paternal first cousin   Breast cancer Cousin 30 - 23       paternal first cousin    Review of Systems  Constitutional:  Negative for appetite change, chills, fatigue, fever and unexpected weight change.  HENT:   Negative for hearing loss, lump/mass and trouble swallowing.   Eyes:  Negative for eye problems and icterus.  Respiratory:  Negative for chest tightness, cough and shortness of breath.   Cardiovascular:  Negative for chest pain, leg swelling and palpitations.  Gastrointestinal:  Negative for abdominal distention, abdominal pain, constipation, diarrhea, nausea and vomiting.  Endocrine: Negative for hot flashes.  Genitourinary:  Negative for difficulty urinating.   Musculoskeletal:  Negative for arthralgias.  Skin:  Negative for itching and rash.  Neurological:  Negative for dizziness, extremity weakness, headaches and numbness.  Hematological:  Negative for  adenopathy. Does not bruise/bleed easily.  Psychiatric/Behavioral:  Positive for depression. The patient is not nervous/anxious.       PHYSICAL EXAMINATION  ECOG PERFORMANCE STATUS: 1 - Symptomatic but completely ambulatory  Vitals:   09/28/22 0916  BP: (!) 148/81  Pulse: (!) 102  Resp: 16  Temp: 97.7 F (36.5 C)  SpO2: 99%    Physical Exam Constitutional:      General: She is not in acute distress.    Appearance: Normal appearance. She is not toxic-appearing.  HENT:     Head: Normocephalic and atraumatic.  Eyes:     General: No scleral icterus. Cardiovascular:     Rate and Rhythm: Normal rate and regular rhythm.     Pulses: Normal pulses.     Heart sounds: Normal heart sounds.  Pulmonary:     Effort: Pulmonary effort is normal.     Breath sounds: Normal breath sounds.  Chest:     Comments: No palpable mass noted in the left upper inner breast or axillary lymphadenopathy   Abdominal:     General: Abdomen is flat. Bowel sounds are normal. There is no distension.     Palpations: Abdomen is soft.     Tenderness: There is no abdominal tenderness.  Musculoskeletal:        General: No swelling.     Cervical back: Neck supple.  Lymphadenopathy:     Cervical: No cervical adenopathy.  Skin:    General: Skin is warm and dry.     Findings: No rash.  Neurological:     General: No focal deficit present.     Mental Status: She is alert.  Psychiatric:        Mood and Affect: Mood normal.        Behavior: Behavior normal.     LABORATORY DATA:  CBC    Component Value Date/Time   WBC 11.3 (H) 09/21/2022 0310   RBC 2.50 (L) 09/21/2022 0310   HGB 7.9 (L) 09/21/2022 0310   HGB 7.1 (L) 09/15/2022 1428   HCT 24.1 (L) 09/21/2022 0310   PLT 63 (L) 09/21/2022 0310   PLT 29 (L) 09/15/2022 1428   MCV 96.4 09/21/2022 0310   MCH 31.6 09/21/2022 0310   MCHC 32.8 09/21/2022 0310   RDW 20.7 (H) 09/21/2022 0310   LYMPHSABS 0.1 (L) 09/15/2022 1550   MONOABS 0.0 (L) 09/15/2022  1550   EOSABS 0.0 09/15/2022 1550   BASOSABS 0.0 09/15/2022 1550  CMP     Component Value Date/Time   NA 139 09/21/2022 0310   K 3.3 (L) 09/21/2022 0310   CL 107 09/21/2022 0310   CO2 24 09/21/2022 0310   GLUCOSE 100 (H) 09/21/2022 0310   BUN <5 (L) 09/21/2022 0310   CREATININE 0.43 (L) 09/21/2022 0310   CREATININE 0.80 09/15/2022 1428   CALCIUM 8.0 (L) 09/21/2022 0310   PROT 5.7 (L) 09/16/2022 0500   ALBUMIN 2.7 (L) 09/16/2022 0500   AST 14 (L) 09/16/2022 0500   AST 10 (L) 09/15/2022 1428   ALT 12 09/16/2022 0500   ALT 7 09/15/2022 1428   ALKPHOS 42 09/16/2022 0500   BILITOT 0.5 09/16/2022 0500   BILITOT 0.9 09/15/2022 1428   GFRNONAA >60 09/21/2022 0310   GFRNONAA >60 09/15/2022 1428   GFRAA >60 12/22/2018 2150       ASSESSMENT and THERAPY PLAN:  Sydney Lee is a 61 y.o. 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   #Stage IIb triple negative breast cancer  --Currently receiving neoadjuvant chemotherapy with carbo/taxol and keytruda x 4 cycles followed by adriamycin, cytoxan and keytruda x 4 cycles.  --Due for cycle 6 day 1 today.  She does not feel well today.  She just finished antibiotics yesterday.  She feels very fatigued.  She does not feel ready to proceed with chemotherapy again.  Given her recent hospitalization, pneumonia and bacteremia since she continues to feel fatigued, we will delay chemotherapy by a week and reassess.  I have discussed about dose adjustment of chemotherapy in case she improves.  Clinically she had complete clinical response so far.  If she is unable to tolerate any further chemo, I will talk to the surgery team to see if she can proceed with surgery sooner than anticipated.  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  This could be related to the recent taxane use.  We will try gabapentin 300 nightly and if well-tolerated, she can take 300 in AM and 300  in p.m.  She understands that this medication can make her drowsy.  #Situational depression, she lost 2 parents in a short time, her husband had amputation of the right leg and she is dealing with a lot personally as well as with her health.  We have discussed about resting, eating and sleeping well in the next 1 week and reevaluating the situation.  We can certainly make her see one of the psychologist if needed.  All questions were answered. The patient knows to call the clinic with any problems, questions or concerns. We can certainly see the patient much sooner if necessary.  I have spent a total of 30 minutes minutes of face-to-face and non-face-to-face time, preparing to see the patient, performing a medically appropriate examination, counseling and educating the patient, documenting clinical information in the electronic health record, and care coordination.  Benay Pike MD

## 2022-09-29 ENCOUNTER — Other Ambulatory Visit: Payer: Self-pay

## 2022-09-29 LAB — T4: T4, Total: 7.5 ug/dL (ref 4.5–12.0)

## 2022-09-30 ENCOUNTER — Ambulatory Visit: Payer: BC Managed Care – PPO

## 2022-09-30 ENCOUNTER — Other Ambulatory Visit: Payer: Self-pay

## 2022-10-03 ENCOUNTER — Encounter: Payer: Self-pay | Admitting: *Deleted

## 2022-10-04 MED FILL — Dexamethasone Sodium Phosphate Inj 100 MG/10ML: INTRAMUSCULAR | Qty: 1 | Status: AC

## 2022-10-05 ENCOUNTER — Encounter: Payer: Self-pay | Admitting: Hematology and Oncology

## 2022-10-05 ENCOUNTER — Encounter: Payer: Self-pay | Admitting: *Deleted

## 2022-10-05 ENCOUNTER — Inpatient Hospital Stay: Payer: BC Managed Care – PPO | Attending: Physician Assistant | Admitting: Hematology and Oncology

## 2022-10-05 ENCOUNTER — Ambulatory Visit: Payer: BC Managed Care – PPO

## 2022-10-05 ENCOUNTER — Other Ambulatory Visit: Payer: Self-pay

## 2022-10-05 ENCOUNTER — Other Ambulatory Visit: Payer: Self-pay | Admitting: *Deleted

## 2022-10-05 ENCOUNTER — Inpatient Hospital Stay: Payer: BC Managed Care – PPO

## 2022-10-05 DIAGNOSIS — R5383 Other fatigue: Secondary | ICD-10-CM | POA: Diagnosis not present

## 2022-10-05 DIAGNOSIS — G629 Polyneuropathy, unspecified: Secondary | ICD-10-CM | POA: Diagnosis not present

## 2022-10-05 DIAGNOSIS — Z5112 Encounter for antineoplastic immunotherapy: Secondary | ICD-10-CM | POA: Diagnosis not present

## 2022-10-05 DIAGNOSIS — Z171 Estrogen receptor negative status [ER-]: Secondary | ICD-10-CM

## 2022-10-05 DIAGNOSIS — F320 Major depressive disorder, single episode, mild: Secondary | ICD-10-CM

## 2022-10-05 DIAGNOSIS — Z5111 Encounter for antineoplastic chemotherapy: Secondary | ICD-10-CM | POA: Diagnosis present

## 2022-10-05 DIAGNOSIS — Z803 Family history of malignant neoplasm of breast: Secondary | ICD-10-CM

## 2022-10-05 DIAGNOSIS — Z79899 Other long term (current) drug therapy: Secondary | ICD-10-CM | POA: Diagnosis not present

## 2022-10-05 DIAGNOSIS — Z5189 Encounter for other specified aftercare: Secondary | ICD-10-CM | POA: Diagnosis not present

## 2022-10-05 DIAGNOSIS — C50212 Malignant neoplasm of upper-inner quadrant of left female breast: Secondary | ICD-10-CM | POA: Insufficient documentation

## 2022-10-05 DIAGNOSIS — I429 Cardiomyopathy, unspecified: Secondary | ICD-10-CM

## 2022-10-05 DIAGNOSIS — Z95828 Presence of other vascular implants and grafts: Secondary | ICD-10-CM

## 2022-10-05 HISTORY — DX: Cardiomyopathy, unspecified: I42.9

## 2022-10-05 LAB — CMP (CANCER CENTER ONLY)
ALT: 6 U/L (ref 0–44)
AST: 12 U/L — ABNORMAL LOW (ref 15–41)
Albumin: 3.4 g/dL — ABNORMAL LOW (ref 3.5–5.0)
Alkaline Phosphatase: 67 U/L (ref 38–126)
Anion gap: 4 — ABNORMAL LOW (ref 5–15)
BUN: <5 mg/dL — ABNORMAL LOW (ref 8–23)
CO2: 30 mmol/L (ref 22–32)
Calcium: 9 mg/dL (ref 8.9–10.3)
Chloride: 105 mmol/L (ref 98–111)
Creatinine: 0.52 mg/dL (ref 0.44–1.00)
GFR, Estimated: >60 mL/min (ref >60)
Glucose, Bld: 128 mg/dL — ABNORMAL HIGH (ref 70–99)
Potassium: 3.3 mmol/L — ABNORMAL LOW (ref 3.5–5.1)
Sodium: 139 mmol/L (ref 135–145)
Total Bilirubin: 0.6 mg/dL (ref 0.3–1.2)
Total Protein: 7.3 g/dL (ref 6.5–8.1)

## 2022-10-05 LAB — CBC WITH DIFFERENTIAL (CANCER CENTER ONLY)
Abs Immature Granulocytes: 0.02 10*3/uL (ref 0.00–0.07)
Basophils Absolute: 0 10*3/uL (ref 0.0–0.1)
Basophils Relative: 1 %
Eosinophils Absolute: 0 10*3/uL (ref 0.0–0.5)
Eosinophils Relative: 0 %
HCT: 27.2 % — ABNORMAL LOW (ref 36.0–46.0)
Hemoglobin: 8.9 g/dL — ABNORMAL LOW (ref 12.0–15.0)
Immature Granulocytes: 0 %
Lymphocytes Relative: 30 %
Lymphs Abs: 1.9 10*3/uL (ref 0.7–4.0)
MCH: 32.1 pg (ref 26.0–34.0)
MCHC: 32.7 g/dL (ref 30.0–36.0)
MCV: 98.2 fL (ref 80.0–100.0)
Monocytes Absolute: 1 10*3/uL (ref 0.1–1.0)
Monocytes Relative: 15 %
Neutro Abs: 3.4 10*3/uL (ref 1.7–7.7)
Neutrophils Relative %: 54 %
Platelet Count: 288 10*3/uL (ref 150–400)
RBC: 2.77 MIL/uL — ABNORMAL LOW (ref 3.87–5.11)
RDW: 20.3 % — ABNORMAL HIGH (ref 11.5–15.5)
WBC Count: 6.3 10*3/uL (ref 4.0–10.5)
nRBC: 0 % (ref 0.0–0.2)

## 2022-10-05 LAB — TSH: TSH: 0.952 u[IU]/mL (ref 0.350–4.500)

## 2022-10-05 MED ORDER — PALONOSETRON HCL INJECTION 0.25 MG/5ML
0.2500 mg | Freq: Once | INTRAVENOUS | Status: AC
  Administered 2022-10-05: 0.25 mg via INTRAVENOUS

## 2022-10-05 MED ORDER — SODIUM CHLORIDE 0.9% FLUSH
10.0000 mL | INTRAVENOUS | Status: DC | PRN
  Administered 2022-10-05: 10 mL

## 2022-10-05 MED ORDER — SODIUM CHLORIDE 0.9 % IV SOLN
200.0000 mg | Freq: Once | INTRAVENOUS | Status: AC
  Administered 2022-10-05: 200 mg via INTRAVENOUS
  Filled 2022-10-05: qty 200

## 2022-10-05 MED ORDER — SODIUM CHLORIDE 0.9% FLUSH
10.0000 mL | Freq: Once | INTRAVENOUS | Status: AC
  Administered 2022-10-05: 10 mL

## 2022-10-05 MED ORDER — SODIUM CHLORIDE 0.9 % IV SOLN
500.0000 mg/m2 | Freq: Once | INTRAVENOUS | Status: AC
  Administered 2022-10-05: 1080 mg via INTRAVENOUS
  Filled 2022-10-05: qty 54

## 2022-10-05 MED ORDER — SODIUM CHLORIDE 0.9 % IV SOLN: Freq: Once | INTRAVENOUS | Status: AC

## 2022-10-05 MED ORDER — HEPARIN SOD (PORK) LOCK FLUSH 100 UNIT/ML IV SOLN
500.0000 [IU] | Freq: Once | INTRAVENOUS | Status: AC | PRN
  Administered 2022-10-05: 500 [IU]

## 2022-10-05 MED ORDER — DOXORUBICIN HCL CHEMO IV INJECTION 2 MG/ML
46.0000 mg/m2 | Freq: Once | INTRAVENOUS | Status: AC
  Administered 2022-10-05: 100 mg via INTRAVENOUS
  Filled 2022-10-05: qty 50

## 2022-10-05 MED ORDER — SODIUM CHLORIDE 0.9 % IV SOLN
10.0000 mg | Freq: Once | INTRAVENOUS | Status: AC
  Administered 2022-10-05: 10 mg via INTRAVENOUS
  Filled 2022-10-05: qty 10

## 2022-10-05 NOTE — Progress Notes (Signed)
Per Dr Chryl Heck, ok to proceed with treatment today with elevated HR and ECHO results from 06/02/22.

## 2022-10-05 NOTE — Progress Notes (Signed)
[facility] Follow up:    Sydney Lee, [address] [place] [address]   DIAGNOSIS:  Cancer Staging  Malignant neoplasm of upper-inner quadrant of left breast in female, estrogen receptor negative ([place]) Staging form: Breast, AJCC 8th Edition - Clinical: Stage IIB (cT2, cN0, cM0, G3, ER-, PR-, HER2-) - Signed by Benay Pike, MD on 05/25/2022 Stage prefix: Initial diagnosis Histologic grading system: 3 grade system   SUMMARY OF ONCOLOGIC HISTORY: Oncology History  Malignant neoplasm of upper-inner quadrant of left breast in female, estrogen receptor negative ([place])  04/27/2022 Mammogram   Diagnostic mammogram showed indeterminate solid mass in the 10:00 location of the left breast.  Small satellite nodule adjacent to the index mass.  Borderline lymph node has cortical thickening of 2.9 mm.  Other lymph nodes have normal morphology.   05/16/2022 Pathology Results   Left breast needle core biopsy showed invasive poorly differentiated adenocarcinoma, grade 3 with tumor necrosis, lymph node biopsy benign reactive, negative for carcinoma.  Prognostics from the tumor showed ER 0%, negative, PR 0%, negative, Ki-67 of 80% and HER2 negative   05/23/2022 Initial Diagnosis   Malignant neoplasm of upper-inner quadrant of left breast in female, estrogen receptor negative ([place])   05/25/2022 Cancer Staging   Staging form: Breast, AJCC 8th Edition - Clinical: Stage IIB (cT2, cN0, cM0, G3, ER-, PR-, HER2-) - Signed by Benay Pike, MD on 05/25/2022 Stage prefix: Initial diagnosis Histologic grading system: 3 grade system   05/25/2022 Genetic Testing   [facility] CancerNext-Expanded Panel was Negative. Report date was 06/02/2022.  The CancerNext-Expanded gene panel offered by [facility] and includes sequencing, rearrangement, and RNA analysis for the following 77 genes: AIP, ALK, APC, ATM, AXIN2, BAP1, BARD1, BLM, BMPR1A, BRCA1, BRCA2, BRIP1, CDC73, CDH1, CDK4, CDKN1B,  CDKN2A, CHEK2, CTNNA1, DICER1, FANCC, FH, FLCN, GALNT12, KIF1B, LZTR1, MAX, MEN1, MET, MLH1, MSH2, MSH3, MSH6, MUTYH, NBN, NF1, NF2, NTHL1, PALB2, PHOX2B, PMS2, POT1, PRKAR1A, PTCH1, PTEN, RAD51C, RAD51D, RB1, RECQL, RET, SDHA, SDHAF2, SDHB, SDHC, SDHD, SMAD4, SMARCA4, SMARCB1, SMARCE1, STK11, SUFU, TMEM127, TP53, TSC1, TSC2, VHL and XRCC2 (sequencing and deletion/duplication); EGFR, EGLN1, HOXB13, KIT, MITF, PDGFRA, POLD1, and POLE (sequencing only); EPCAM and GREM1 (deletion/duplication only).    06/02/2022 Initial Biopsy   Additional left axillary node biopsy   06/06/2022 - 08/02/2022 Chemotherapy   Patient is on Treatment Plan : BREAST Pembrolizumab (200) D1 + Carboplatin (5) D1 + Paclitaxel (80) D1,8,15 q21d X 4 cycles / Pembrolizumab (200) D1 + AC D1 q21d x 4 cycles     06/06/2022 -  Chemotherapy   Patient is on Treatment Plan : BREAST Pembrolizumab (200) D1 + Carboplatin (5) D1 + Paclitaxel (80) D1,8,15 q21d X 4 cycles / Pembrolizumab (200) D1 + AC D1 q21d x 4 cycles       CURRENT THERAPY: Taxol/Carbo/Keytruda  INTERVAL HISTORY:  Sydney Lee 61 y.o. female returns for follow-up and evaluation prior to cycle 6 of day 1 of  AC Keytruda. After cycle 5 D1, she was admitted with streptococcal pneumonia infection and was on antibiotics, required transfusion, felt severe fatigue, debilitated after.  She is now here today with her daughter.  She continues to report worsening neuropathy, has been taking gabapentin 2 tablets at night.  She feels frustrated that she is unable to do all her activities like she used to.  She also tells me that she is very depressed, feels worthless and as if she is a burden to her family.  She however denies suicidal ideation at any cost.  She really is motivated to stay here for her daughter and granddaughter.  She would like to speak to a counselor.  Besides worsening neuropathy, she denies any other complaints.  She is slowly able to do more things at home and she is  willing to continue chemotherapy.  Rest of the pertinent 10 point ROS reviewed and negative  Patient Active Problem List   Diagnosis Date Noted   Malnutrition of moderate degree 09/17/2022   Severe sepsis with acute organ dysfunction ([place]) 09/15/2022   HTN (hypertension) 09/15/2022   Diabetes mellitus, type II ([place]) 09/15/2022   Anxiety 09/15/2022   Depression 09/15/2022   Port-A-Cath in place 06/06/2022   Infusion reaction 06/06/2022   Genetic testing 06/03/2022   Family history of breast cancer 05/25/2022   Family history of ovarian cancer 05/25/2022   Malignant neoplasm of upper-inner quadrant of left breast in female, estrogen receptor negative ([place]) 05/23/2022    is allergic to [facility] [aprepitant], codeine, and latex.  MEDICAL HISTORY: Past Medical History:  Diagnosis Date   Anxiety    Arthritis    Breast cancer ([place])    Depression    Diabetes mellitus without complication ([place])    Hypertension     SURGICAL HISTORY: Past Surgical History:  Procedure Laterality Date   PORTACATH PLACEMENT N/A 06/01/2022   Procedure: INSERTION PORT-A-CATH WITH ULTRASOUND GUIDANCE;  Surgeon: Stark Klein, MD;  Location: [facility];  Service: General;  Laterality: N/A;    SOCIAL HISTORY: Social History   Socioeconomic History   Marital status: Married    Spouse name: Not on file   Number of children: Not on file   Years of education: Not on file   Highest education level: Not on file  Occupational History   Not on file  Tobacco Use   Smoking status: Former    Packs/day: 1.00    Years: 40.00    Total pack years: 40.00    Types: Cigarettes    Quit date: 06/05/2022    Years since quitting: 0.3   Smokeless tobacco: Never  Substance and Sexual Activity   Alcohol use: No   Drug use: No   Sexual activity: Not on file  Other Topics Concern   Not on file  Social History Narrative   Not on file   Social Determinants of Health   Financial Resource Strain: High Risk (06/14/2022)    Overall Financial Resource Strain (CARDIA)    Difficulty of Paying Living Expenses: Hard  Food Insecurity: No Food Insecurity (09/15/2022)   Hunger Vital Sign    Worried About Running Out of Food in the Last Year: Never true    Ran Out of Food in the Last Year: Never true  Transportation Needs: No Transportation Needs (09/15/2022)   PRAPARE - Hydrologist (Medical): No    Lack of Transportation (Non-Medical): No  Physical Activity: Not on file  Stress: Not on file  Social Connections: Not on file  Intimate Partner Violence: Not At Risk (09/16/2022)   Humiliation, Afraid, Rape, and Kick questionnaire    Fear of Current or Ex-Partner: No    Emotionally Abused: No    Physically Abused: No    Sexually Abused: No    FAMILY HISTORY: Family History  Problem Relation Age of Onset   Leukemia Mother 102   Throat cancer Maternal Uncle 85   Prostate cancer Maternal Uncle    Breast cancer Cousin 15  maternal first cousin   Ovarian cancer Cousin        maternal first cousin   Breast cancer Cousin 6 - 37       paternal first cousin   Breast cancer Cousin 80 - 53       paternal first cousin    Review of Systems  Constitutional:  Negative for appetite change, chills, fatigue, fever and unexpected weight change.  HENT:   Negative for hearing loss, lump/mass and trouble swallowing.   Eyes:  Negative for eye problems and icterus.  Respiratory:  Negative for chest tightness, cough and shortness of breath.   Cardiovascular:  Negative for chest pain, leg swelling and palpitations.  Gastrointestinal:  Negative for abdominal distention, abdominal pain, constipation, diarrhea, nausea and vomiting.  Endocrine: Negative for hot flashes.  Genitourinary:  Negative for difficulty urinating.   Musculoskeletal:  Negative for arthralgias.  Skin:  Negative for itching and rash.  Neurological:  Negative for dizziness, extremity weakness, headaches and numbness.   Hematological:  Negative for adenopathy. Does not bruise/bleed easily.  Psychiatric/Behavioral:  Positive for depression. The patient is not nervous/anxious.       PHYSICAL EXAMINATION  ECOG PERFORMANCE STATUS: 1 - Symptomatic but completely ambulatory  Vitals:   10/05/22 1136  BP: 132/80  Pulse: (!) 109  Resp: 18  Temp: 97.6 F (36.4 C)  SpO2: 98%    Physical Exam Constitutional:      General: She is not in acute distress.    Appearance: Normal appearance. She is not toxic-appearing.  HENT:     Head: Normocephalic and atraumatic.  Eyes:     General: No scleral icterus. Cardiovascular:     Rate and Rhythm: Normal rate and regular rhythm.     Pulses: Normal pulses.     Heart sounds: Normal heart sounds.  Pulmonary:     Effort: Pulmonary effort is normal.     Breath sounds: Normal breath sounds.  Chest:     Comments:    Abdominal:     General: Abdomen is flat. Bowel sounds are normal. There is no distension.     Palpations: Abdomen is soft.     Tenderness: There is no abdominal tenderness.  Musculoskeletal:        General: No swelling.     Cervical back: Neck supple.  Lymphadenopathy:     Cervical: No cervical adenopathy.  Skin:    General: Skin is warm and dry.     Findings: No rash.  Neurological:     General: No focal deficit present.     Mental Status: She is alert.  Psychiatric:        Mood and Affect: Mood normal.        Behavior: Behavior normal.     LABORATORY DATA:  CBC    Component Value Date/Time   WBC 6.3 10/05/2022 1125   WBC 11.3 (H) 09/21/2022 0310   RBC 2.77 (L) 10/05/2022 1125   HGB 8.9 (L) 10/05/2022 1125   HCT 27.2 (L) 10/05/2022 1125   PLT 288 10/05/2022 1125   MCV 98.2 10/05/2022 1125   MCH 32.1 10/05/2022 1125   MCHC 32.7 10/05/2022 1125   RDW 20.3 (H) 10/05/2022 1125   LYMPHSABS 1.9 10/05/2022 1125   MONOABS 1.0 10/05/2022 1125   EOSABS 0.0 10/05/2022 1125   BASOSABS 0.0 10/05/2022 1125    CMP     Component Value  Date/Time   NA 139 10/05/2022 1125   K 3.3 (L) 10/05/2022 1125  CL 105 10/05/2022 1125   CO2 30 10/05/2022 1125   GLUCOSE 128 (H) 10/05/2022 1125   BUN <5 (L) 10/05/2022 1125   CREATININE 0.52 10/05/2022 1125   CALCIUM 9.0 10/05/2022 1125   PROT 7.3 10/05/2022 1125   ALBUMIN 3.4 (L) 10/05/2022 1125   AST 12 (L) 10/05/2022 1125   ALT 6 10/05/2022 1125   ALKPHOS 67 10/05/2022 1125   BILITOT 0.6 10/05/2022 1125   GFRNONAA >60 10/05/2022 1125   GFRAA >60 12/22/2018 2150       ASSESSMENT and THERAPY PLAN:  Sydney Lee is a 61 y.o. 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   #Stage IIb triple negative breast cancer  --Currently receiving neoadjuvant chemotherapy with carbo/taxol and keytruda x 4 cycles followed by adriamycin, cytoxan and keytruda x 4 cycles.  --Due for cycle 6 day 1 today.  She does not feel well today.  She just finished antibiotics yesterday.  She feels very fatigued.  She does not feel ready to proceed with chemotherapy again.  Given her recent hospitalization, pneumonia and bacteremia since she continues to feel fatigued, we will delay chemotherapy by a week and reassess.  I have discussed about dose adjustment of chemotherapy in case she improves.  Clinically she had complete clinical response so far.  We have discussed about trying 1 more cycle of dose reduction and if she cannot tolerate this well, she will proceed with surgery sooner than anticipated.  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  We have discussed about doing gabapentin 300 in the a.m. and 600 in the p.m.  She may have noticed a very mild improvement.  She tells me that the medication does not make her drowsy at all.  If gabapentin does not improve her neuropathy, we can try Lyrica or Cymbalta.  #Situational depression, she lost 2 parents in a short time, her husband had amputation of the right leg and she is  dealing with a lot personally as well as with her health.  She once again mentions about feeling depressed, worthless but denies any suicidal ideation.  We will make a referral to Dr. Cari Caraway from psychology at [facility] for further assistance.  #Echocardiogram, last echo in June 29.  No clinical symptoms concerning for cardiac compromise and she only received 1 cycle of Adriamycin so far.  Hands we have discussed about proceeding forward with chemotherapy and doing the echocardiogram as soon as possible.  All questions were answered. The patient knows to call the clinic with any problems, questions or concerns. We can certainly see the patient much sooner if necessary.  I have spent a total of 30 minutes minutes of face-to-face and non-face-to-face time, preparing to see the patient, performing a medically appropriate examination, counseling and educating the patient, documenting clinical information in the electronic health record, and care coordination.  Benay Pike MD

## 2022-10-05 NOTE — Patient Instructions (Signed)
[facility]  Discharge Instructions: Thank you for choosing [facility] to provide your oncology and hematology care.   If you have a lab appointment with the [facility], please go directly to the [facility] and check in at the registration area.   Wear comfortable clothing and clothing appropriate for easy access to any Portacath or PICC line.   We strive to give you quality time with your provider. You may need to reschedule your appointment if you arrive late (15 or more minutes).  Arriving late affects you and other patients whose appointments are after yours.  Also, if you miss three or more appointments without notifying the office, you may be dismissed from the clinic at the provider's discretion.      For prescription refill requests, have your pharmacy contact our office and allow 72 hours for refills to be completed.    Today you received the following chemotherapy and/or immunotherapy agents: pembrolizumab, doxorubicin, cyclophosphamide      To help prevent nausea and vomiting after your treatment, we encourage you to take your nausea medication as directed.  BELOW ARE SYMPTOMS THAT SHOULD BE REPORTED IMMEDIATELY: *FEVER GREATER THAN 100.4 F (38 C) OR HIGHER *CHILLS OR SWEATING *NAUSEA AND VOMITING THAT IS NOT CONTROLLED WITH YOUR NAUSEA MEDICATION *UNUSUAL SHORTNESS OF BREATH *UNUSUAL BRUISING OR BLEEDING *URINARY PROBLEMS (pain or burning when urinating, or frequent urination) *BOWEL PROBLEMS (unusual diarrhea, constipation, pain near the anus) TENDERNESS IN MOUTH AND THROAT WITH OR WITHOUT PRESENCE OF ULCERS (sore throat, sores in mouth, or a toothache) UNUSUAL RASH, SWELLING OR PAIN  UNUSUAL VAGINAL DISCHARGE OR ITCHING   Items with * indicate a potential emergency and should be followed up as soon as possible or go to the Emergency Department if any problems should occur.  Please show the CHEMOTHERAPY ALERT CARD or  IMMUNOTHERAPY ALERT CARD at check-in to the Emergency Department and triage nurse.  Should you have questions after your visit or need to cancel or reschedule your appointment, please contact [facility]  Dept: [phone]  and follow the prompts.  Office hours are 8:00 a.m. to 4:30 p.m. Monday - Friday. Please note that voicemails left after 4:00 p.m. may not be returned until the following business day.  We are closed weekends and major holidays. You have access to a nurse at all times for urgent questions. Please call the main number to the clinic Dept: [phone] and follow the prompts.   For any non-urgent questions, you may also contact your provider using MyChart. We now offer e-Visits for anyone 52 and older to request care online for non-urgent symptoms. For details visit [email or web address].   Also download the MyChart app! Go to the app store, search "MyChart", open the app, select [facility], and log in with your MyChart username and password.  Masks are optional in the cancer centers. If you would like for your care team to wear a mask while they are taking care of you, please let them know. You may have one support person who is at least 62 years old accompany you for your appointments.

## 2022-10-06 ENCOUNTER — Telehealth: Payer: Self-pay | Admitting: *Deleted

## 2022-10-06 LAB — T4: T4, Total: 7 ug/dL (ref 4.5–12.0)

## 2022-10-06 NOTE — Telephone Encounter (Signed)
Received signed leave of absence paperwork today.  Successfully returned to [facility] fax: [phone].  Copy to [employer] bin for items to be scanned.  E-mailed to [email or web address] and to [facility] folder near registrar area for pick-up on next scheduled appointment completes process/

## 2022-10-07 ENCOUNTER — Other Ambulatory Visit: Payer: Self-pay

## 2022-10-07 ENCOUNTER — Inpatient Hospital Stay: Payer: BC Managed Care – PPO

## 2022-10-07 ENCOUNTER — Telehealth: Payer: Self-pay | Admitting: *Deleted

## 2022-10-07 VITALS — BP 119/69 | HR 88 | Temp 98.6°F | Resp 17

## 2022-10-07 DIAGNOSIS — Z5112 Encounter for antineoplastic immunotherapy: Secondary | ICD-10-CM | POA: Diagnosis not present

## 2022-10-07 DIAGNOSIS — Z171 Estrogen receptor negative status [ER-]: Secondary | ICD-10-CM

## 2022-10-07 MED ORDER — PEGFILGRASTIM-CBQV 6 MG/0.6ML ~~LOC~~ SOSY
6.0000 mg | PREFILLED_SYRINGE | Freq: Once | SUBCUTANEOUS | Status: AC
  Administered 2022-10-07: 6 mg via SUBCUTANEOUS
  Filled 2022-10-07: qty 0.6

## 2022-10-07 NOTE — Telephone Encounter (Signed)
Sydney Lee's daughter Dionza [phone]) called with patient audible on the line asks, "Why does my FMLA form read leave of absence ends 10/10/2022.  How am I to return to work on 10/11/2022.  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 Eventually I would like to return to work but unable to at this time."   "She can't walk. I brought her in on November 1st and had to use wheelchair.  Uses a cane or walker around the house.  Unable to say how many feet she can walk or move.  Does not have a grip.  Has not driven in three weeks, unable to write, the pen drops out her hand. Recently discharged from the hospital.  How can she assist the kids at school?  Children also spread germs."  Attempted to reach nurse and provider to provide above information for advice or orders of    "worsening neuropathy, ECOG: 1.  Return call received from today's nurse.  Noted fall risk, neuropathy.  This nurse updating FMLA submitted 10/06/2022 for end date of return to work as inconclusive.

## 2022-10-10 ENCOUNTER — Telehealth: Payer: Self-pay | Admitting: Hematology and Oncology

## 2022-10-10 NOTE — Telephone Encounter (Signed)
Called patient per 11/6 regarding upcoming November appointments. Left voicemail.

## 2022-10-11 ENCOUNTER — Ambulatory Visit (HOSPITAL_COMMUNITY)
Admission: RE | Admit: 2022-10-11 | Discharge: 2022-10-11 | Disposition: A | Discharge status: Home / Self Care | Payer: BC Managed Care – PPO | Source: Ambulatory Visit | Attending: Hematology and Oncology | Admitting: Hematology and Oncology

## 2022-10-11 DIAGNOSIS — Z5111 Encounter for antineoplastic chemotherapy: Secondary | ICD-10-CM | POA: Insufficient documentation

## 2022-10-11 DIAGNOSIS — Z171 Estrogen receptor negative status [ER-]: Secondary | ICD-10-CM | POA: Diagnosis not present

## 2022-10-11 DIAGNOSIS — I10 Essential (primary) hypertension: Secondary | ICD-10-CM | POA: Insufficient documentation

## 2022-10-11 DIAGNOSIS — C50212 Malignant neoplasm of upper-inner quadrant of left female breast: Secondary | ICD-10-CM | POA: Insufficient documentation

## 2022-10-11 DIAGNOSIS — E119 Type 2 diabetes mellitus without complications: Secondary | ICD-10-CM | POA: Insufficient documentation

## 2022-10-11 DIAGNOSIS — Z0189 Encounter for other specified special examinations: Secondary | ICD-10-CM | POA: Diagnosis not present

## 2022-10-11 DIAGNOSIS — R7881 Bacteremia: Secondary | ICD-10-CM | POA: Diagnosis not present

## 2022-10-11 LAB — ECHOCARDIOGRAM COMPLETE
Area-P 1/2: 5.2 cm2
Calc EF: 41.8 %
S' Lateral: 3.1 cm
Single Plane A2C EF: 41.7 %
Single Plane A4C EF: 49 %

## 2022-10-11 NOTE — Progress Notes (Signed)
  Echocardiogram 2D Echocardiogram has been performed.  Bobbye Charleston 10/11/2022, 1:47 PM

## 2022-10-13 ENCOUNTER — Telehealth: Payer: Self-pay | Admitting: *Deleted

## 2022-10-13 ENCOUNTER — Other Ambulatory Visit: Payer: Self-pay | Admitting: *Deleted

## 2022-10-13 ENCOUNTER — Encounter: Payer: Self-pay | Admitting: Hematology and Oncology

## 2022-10-13 DIAGNOSIS — R5383 Other fatigue: Secondary | ICD-10-CM

## 2022-10-13 NOTE — Telephone Encounter (Signed)
This RN spoke with pt per her call stating she is having some of the same symptoms as prior treatments "even though I know she lowered my doses"  She states severe fatigue interfering with her ADL's including having strength in her legs - as well as she does not feel safe to drive.  She has continued neuropathy that she is dropping things and not able to grasp well.  She states she has not noted any improvement with the neuropathy since starting the neurontin.  She has to force herself to eat and drink that then seems to cause nausea ( using anti nausea meds).  " I know I am getting chemo and understand there will be side effects - but I just feel so weak and unable to do what I need to and have to depend on others "  This RN validated her feelings as well as possible benefit with administration of IVF - with pt is agreement.  Also informed her will review with MD for possible use of other prescription medication for neuropathy.

## 2022-10-13 NOTE — Telephone Encounter (Signed)
Orders being entered for IVFs

## 2022-10-14 ENCOUNTER — Encounter: Payer: Self-pay | Admitting: *Deleted

## 2022-10-14 ENCOUNTER — Encounter: Payer: Self-pay | Admitting: Hematology and Oncology

## 2022-10-14 ENCOUNTER — Inpatient Hospital Stay: Payer: BC Managed Care – PPO

## 2022-10-14 ENCOUNTER — Other Ambulatory Visit: Payer: Self-pay | Admitting: *Deleted

## 2022-10-14 ENCOUNTER — Other Ambulatory Visit: Payer: Self-pay | Admitting: Hematology and Oncology

## 2022-10-14 ENCOUNTER — Inpatient Hospital Stay (HOSPITAL_BASED_OUTPATIENT_CLINIC_OR_DEPARTMENT_OTHER): Payer: BC Managed Care – PPO | Admitting: Hematology and Oncology

## 2022-10-14 ENCOUNTER — Telehealth: Payer: Self-pay | Admitting: *Deleted

## 2022-10-14 ENCOUNTER — Other Ambulatory Visit: Payer: Self-pay

## 2022-10-14 VITALS — BP 119/69 | HR 86 | Temp 97.8°F | Resp 16 | Ht 69.0 in | Wt 189.1 lb

## 2022-10-14 VITALS — BP 117/67 | HR 85 | Temp 98.7°F | Resp 17

## 2022-10-14 DIAGNOSIS — R5383 Other fatigue: Secondary | ICD-10-CM

## 2022-10-14 DIAGNOSIS — C50212 Malignant neoplasm of upper-inner quadrant of left female breast: Secondary | ICD-10-CM

## 2022-10-14 DIAGNOSIS — Z95828 Presence of other vascular implants and grafts: Secondary | ICD-10-CM

## 2022-10-14 DIAGNOSIS — R931 Abnormal findings on diagnostic imaging of heart and coronary circulation: Secondary | ICD-10-CM

## 2022-10-14 DIAGNOSIS — Z5112 Encounter for antineoplastic immunotherapy: Secondary | ICD-10-CM | POA: Diagnosis not present

## 2022-10-14 DIAGNOSIS — E440 Moderate protein-calorie malnutrition: Secondary | ICD-10-CM

## 2022-10-14 DIAGNOSIS — Z171 Estrogen receptor negative status [ER-]: Secondary | ICD-10-CM

## 2022-10-14 LAB — CBC WITH DIFFERENTIAL (CANCER CENTER ONLY)
Abs Immature Granulocytes: 0.03 10*3/uL (ref 0.00–0.07)
Basophils Absolute: 0 10*3/uL (ref 0.0–0.1)
Basophils Relative: 2 %
Eosinophils Absolute: 0 10*3/uL (ref 0.0–0.5)
Eosinophils Relative: 1 %
HCT: 25.9 % — ABNORMAL LOW (ref 36.0–46.0)
Hemoglobin: 8.6 g/dL — ABNORMAL LOW (ref 12.0–15.0)
Immature Granulocytes: 1 %
Lymphocytes Relative: 38 %
Lymphs Abs: 1 10*3/uL (ref 0.7–4.0)
MCH: 32.6 pg (ref 26.0–34.0)
MCHC: 33.2 g/dL (ref 30.0–36.0)
MCV: 98.1 fL (ref 80.0–100.0)
Monocytes Absolute: 0.4 10*3/uL (ref 0.1–1.0)
Monocytes Relative: 17 %
Neutro Abs: 1 10*3/uL — ABNORMAL LOW (ref 1.7–7.7)
Neutrophils Relative %: 41 %
Platelet Count: 104 10*3/uL — ABNORMAL LOW (ref 150–400)
RBC: 2.64 MIL/uL — ABNORMAL LOW (ref 3.87–5.11)
RDW: 18 % — ABNORMAL HIGH (ref 11.5–15.5)
Smear Review: NORMAL
WBC Count: 2.6 10*3/uL — ABNORMAL LOW (ref 4.0–10.5)
nRBC: 0 % (ref 0.0–0.2)

## 2022-10-14 LAB — BRAIN NATRIURETIC PEPTIDE: B Natriuretic Peptide: 34 pg/mL (ref 0.0–100.0)

## 2022-10-14 LAB — CMP (CANCER CENTER ONLY)
ALT: 6 U/L (ref 0–44)
AST: 12 U/L — ABNORMAL LOW (ref 15–41)
Albumin: 3.7 g/dL (ref 3.5–5.0)
Alkaline Phosphatase: 78 U/L (ref 38–126)
Anion gap: 6 (ref 5–15)
BUN: 6 mg/dL — ABNORMAL LOW (ref 8–23)
CO2: 28 mmol/L (ref 22–32)
Calcium: 9.4 mg/dL (ref 8.9–10.3)
Chloride: 105 mmol/L (ref 98–111)
Creatinine: 0.43 mg/dL — ABNORMAL LOW (ref 0.44–1.00)
GFR, Estimated: >60 mL/min (ref >60)
Glucose, Bld: 83 mg/dL (ref 70–99)
Potassium: 3.6 mmol/L (ref 3.5–5.1)
Sodium: 139 mmol/L (ref 135–145)
Total Bilirubin: 0.5 mg/dL (ref 0.3–1.2)
Total Protein: 7.6 g/dL (ref 6.5–8.1)

## 2022-10-14 LAB — TROPONIN I (HIGH SENSITIVITY): Troponin I (High Sensitivity): 3 ng/L (ref <18)

## 2022-10-14 LAB — CORTISOL: Cortisol, Plasma: 6.1 ug/dL

## 2022-10-14 MED ORDER — METHYLPREDNISOLONE 4 MG PO TBPK: ORAL_TABLET | ORAL | 0 refills | Status: DC

## 2022-10-14 MED ORDER — SODIUM CHLORIDE 0.9 % IV SOLN: INTRAVENOUS | Status: DC

## 2022-10-14 MED ORDER — DULOXETINE HCL 30 MG PO CPEP: 30.0000 mg | ORAL_CAPSULE | Freq: Every day | ORAL | 3 refills | Status: DC

## 2022-10-14 MED ORDER — SODIUM CHLORIDE 0.9% FLUSH
10.0000 mL | Freq: Once | INTRAVENOUS | Status: AC
  Administered 2022-10-14: 10 mL

## 2022-10-14 MED ORDER — HEPARIN SOD (PORK) LOCK FLUSH 100 UNIT/ML IV SOLN
500.0000 [IU] | Freq: Once | INTRAVENOUS | Status: AC
  Administered 2022-10-14: 500 [IU]

## 2022-10-14 NOTE — Patient Instructions (Signed)

## 2022-10-14 NOTE — Telephone Encounter (Signed)
This RN contacted the [facility] per need for urgent referral for evaluation of noted decreased ejection fraction.  Information given to Rosalyn-with need for urgent appt- she will review with nurse to obtain an appt and they will call the patient.  This RN did ask for at least a 1 day notice due to need for pt to arrange transportation.

## 2022-10-14 NOTE — Progress Notes (Signed)
[facility] Follow up:    Sydney Lee, [address] [place] [address]   DIAGNOSIS:  Cancer Staging  Malignant neoplasm of upper-inner quadrant of left breast in female, estrogen receptor negative ([place]) Staging form: Breast, AJCC 8th Edition - Clinical: Stage IIB (cT2, cN0, cM0, G3, ER-, PR-, HER2-) - Signed by Benay Pike, MD on 05/25/2022 Stage prefix: Initial diagnosis Histologic grading system: 3 grade system   SUMMARY OF ONCOLOGIC HISTORY: Oncology History  Malignant neoplasm of upper-inner quadrant of left breast in female, estrogen receptor negative ([place])  04/27/2022 Mammogram   Diagnostic mammogram showed indeterminate solid mass in the 10:00 location of the left breast.  Small satellite nodule adjacent to the index mass.  Borderline lymph node has cortical thickening of 2.9 mm.  Other lymph nodes have normal morphology.   05/16/2022 Pathology Results   Left breast needle core biopsy showed invasive poorly differentiated adenocarcinoma, grade 3 with tumor necrosis, lymph node biopsy benign reactive, negative for carcinoma.  Prognostics from the tumor showed ER 0%, negative, PR 0%, negative, Ki-67 of 80% and HER2 negative   05/23/2022 Initial Diagnosis   Malignant neoplasm of upper-inner quadrant of left breast in female, estrogen receptor negative ([place])   05/25/2022 Cancer Staging   Staging form: Breast, AJCC 8th Edition - Clinical: Stage IIB (cT2, cN0, cM0, G3, ER-, PR-, HER2-) - Signed by Benay Pike, MD on 05/25/2022 Stage prefix: Initial diagnosis Histologic grading system: 3 grade system   05/25/2022 Genetic Testing   [facility] CancerNext-Expanded Panel was Negative. Report date was 06/02/2022.  The CancerNext-Expanded gene panel offered by [facility] and includes sequencing, rearrangement, and RNA analysis for the following 77 genes: AIP, ALK, APC, ATM, AXIN2, BAP1, BARD1, BLM, BMPR1A, BRCA1, BRCA2, BRIP1, CDC73, CDH1, CDK4,  CDKN1B, CDKN2A, CHEK2, CTNNA1, DICER1, FANCC, FH, FLCN, GALNT12, KIF1B, LZTR1, MAX, MEN1, MET, MLH1, MSH2, MSH3, MSH6, MUTYH, NBN, NF1, NF2, NTHL1, PALB2, PHOX2B, PMS2, POT1, PRKAR1A, PTCH1, PTEN, RAD51C, RAD51D, RB1, RECQL, RET, SDHA, SDHAF2, SDHB, SDHC, SDHD, SMAD4, SMARCA4, SMARCB1, SMARCE1, STK11, SUFU, TMEM127, TP53, TSC1, TSC2, VHL and XRCC2 (sequencing and deletion/duplication); EGFR, EGLN1, HOXB13, KIT, MITF, PDGFRA, POLD1, and POLE (sequencing only); EPCAM and GREM1 (deletion/duplication only).    06/02/2022 Initial Biopsy   Additional left axillary node biopsy   06/06/2022 - 08/02/2022 Chemotherapy   Patient is on Treatment Plan : BREAST Pembrolizumab (200) D1 + Carboplatin (5) D1 + Paclitaxel (80) D1,8,15 q21d X 4 cycles / Pembrolizumab (200) D1 + AC D1 q21d x 4 cycles     06/06/2022 - 10/07/2022 Chemotherapy   Patient is on Treatment Plan : BREAST Pembrolizumab (200) D1 + Carboplatin (5) D1 + Paclitaxel (80) D1,8,15 q21d X 4 cycles / Pembrolizumab (200) D1 + AC D1 q21d x 4 cycles       CURRENT THERAPY: Taxol/Carbo/Keytruda  INTERVAL HISTORY:  Sydney Lee 61 y.o. female returns for follow-up and evaluation prior to cycle 6 of day 1 of  AC Keytruda.  After cycle 5 D1, she was admitted with streptococcal pneumonia infection and was on antibiotics, required transfusion, felt severe fatigue, debilitated after.    When she came for her last visit, she was still recovering but willing to try more treatment.  She recently called that she continues to have a lot of fatigue and worsening neuropathy hence we brought her back in for some IV fluids.  I also asked for an echocardiogram because of this debilitating fatigue, she is here to  review the results.  She denies any chest pain, chest pressure, shortness of breath. No lower extremity swelling.  She once again struggles with the neuropathy, she tells me gabapentin does not do anything.  She is a bit relieved that her husband is now in the  facility so she does not have to worry about taking care of her since she is barely able to take care of herself.  She tells still tells me that she has plenty of grief and a lot of stress given the loss of her parents and her husband having amputation.  Besides neuropathy and fatigue, she denies any complaints today. Rest of the pertinent 10 point ROS reviewed and negative  Patient Active Problem List   Diagnosis Date Noted   Fatigue due to treatment 10/13/2022   Malnutrition of moderate degree 09/17/2022   Severe sepsis with acute organ dysfunction ([place]) 09/15/2022   HTN (hypertension) 09/15/2022   Diabetes mellitus, type II ([place]) 09/15/2022   Anxiety 09/15/2022   Depression 09/15/2022   Port-A-Cath in place 06/06/2022   Infusion reaction 06/06/2022   Genetic testing 06/03/2022   Family history of breast cancer 05/25/2022   Family history of ovarian cancer 05/25/2022   Malignant neoplasm of upper-inner quadrant of left breast in female, estrogen receptor negative ([place]) 05/23/2022    is allergic to [facility] [aprepitant], codeine, and latex.  MEDICAL HISTORY: Past Medical History:  Diagnosis Date   Anxiety    Arthritis    Breast cancer ([place])    Depression    Diabetes mellitus without complication ([place])    Hypertension     SURGICAL HISTORY: Past Surgical History:  Procedure Laterality Date   PORTACATH PLACEMENT N/A 06/01/2022   Procedure: INSERTION PORT-A-CATH WITH ULTRASOUND GUIDANCE;  Surgeon: Stark Klein, MD;  Location: [facility];  Service: General;  Laterality: N/A;    SOCIAL HISTORY: Social History   Socioeconomic History   Marital status: Married    Spouse name: Not on file   Number of children: Not on file   Years of education: Not on file   Highest education level: Not on file  Occupational History   Not on file  Tobacco Use   Smoking status: Former    Packs/day: 1.00    Years: 40.00    Total pack years: 40.00    Types: Cigarettes    Quit date: 06/05/2022     Years since quitting: 0.3   Smokeless tobacco: Never  Substance and Sexual Activity   Alcohol use: No   Drug use: No   Sexual activity: Not on file  Other Topics Concern   Not on file  Social History Narrative   Not on file   Social Determinants of Health   Financial Resource Strain: High Risk (06/14/2022)   Overall Financial Resource Strain (CARDIA)    Difficulty of Paying Living Expenses: Hard  Food Insecurity: No Food Insecurity (09/15/2022)   Hunger Vital Sign    Worried About Running Out of Food in the Last Year: Never true    Ran Out of Food in the Last Year: Never true  Transportation Needs: No Transportation Needs (09/15/2022)   PRAPARE - Hydrologist (Medical): No    Lack of Transportation (Non-Medical): No  Physical Activity: Not on file  Stress: Not on file  Social Connections: Not on file  Intimate Partner Violence: Not At Risk (09/16/2022)   Humiliation, Afraid, Rape, and Kick questionnaire    Fear of Current or Ex-Partner: No  Emotionally Abused: No    Physically Abused: No    Sexually Abused: No    FAMILY HISTORY: Family History  Problem Relation Age of Onset   Leukemia Mother 58   Throat cancer Maternal Uncle 28   Prostate cancer Maternal Uncle    Breast cancer Cousin 50       maternal first cousin   Ovarian cancer Cousin        maternal first cousin   Breast cancer Cousin 51 - 18       paternal first cousin   Breast cancer Cousin 34 - 68       paternal first cousin    Review of Systems  Constitutional:  Negative for appetite change, chills, fatigue, fever and unexpected weight change.  HENT:   Negative for hearing loss, lump/mass and trouble swallowing.   Eyes:  Negative for eye problems and icterus.  Respiratory:  Negative for chest tightness, cough and shortness of breath.   Cardiovascular:  Negative for chest pain, leg swelling and palpitations.  Gastrointestinal:  Negative for abdominal distention, abdominal  pain, constipation, diarrhea, nausea and vomiting.  Endocrine: Negative for hot flashes.  Genitourinary:  Negative for difficulty urinating.   Musculoskeletal:  Negative for arthralgias.  Skin:  Negative for itching and rash.  Neurological:  Negative for dizziness, extremity weakness, headaches and numbness.  Hematological:  Negative for adenopathy. Does not bruise/bleed easily.  Psychiatric/Behavioral:  Positive for depression. The patient is not nervous/anxious.       PHYSICAL EXAMINATION  ECOG PERFORMANCE STATUS: 1 - Symptomatic but completely ambulatory  Vitals:   10/14/22 0950  BP: 119/69  Pulse: 86  Resp: 16  Temp: 97.8 F (36.6 C)  SpO2: 99%    Physical Exam Constitutional:      General: She is not in acute distress.    Appearance: Normal appearance. She is not toxic-appearing.     Comments: She is in a wheelchair today mostly because of neuropathy  HENT:     Head: Normocephalic and atraumatic.  Eyes:     General: No scleral icterus. Cardiovascular:     Rate and Rhythm: Normal rate and regular rhythm.     Pulses: Normal pulses.     Heart sounds: Normal heart sounds.  Pulmonary:     Effort: Pulmonary effort is normal.     Breath sounds: Normal breath sounds.  Chest:     Comments:    Abdominal:     General: Abdomen is flat. Bowel sounds are normal. There is no distension.     Palpations: Abdomen is soft.     Tenderness: There is no abdominal tenderness.  Musculoskeletal:        General: No swelling.     Cervical back: Neck supple.  Lymphadenopathy:     Cervical: No cervical adenopathy.  Skin:    General: Skin is warm and dry.     Findings: No rash.  Neurological:     General: No focal deficit present.     Mental Status: She is alert.  Psychiatric:        Mood and Affect: Mood normal.        Behavior: Behavior normal.    Breast exam deferred today LABORATORY DATA:  CBC    Component Value Date/Time   WBC 2.6 (L) 10/14/2022 1017   WBC 11.3 (H)  09/21/2022 0310   RBC 2.64 (L) 10/14/2022 1017   HGB 8.6 (L) 10/14/2022 1017   HCT 25.9 (L) 10/14/2022 1017   PLT 104 (  L) 10/14/2022 1017   MCV 98.1 10/14/2022 1017   MCH 32.6 10/14/2022 1017   MCHC 33.2 10/14/2022 1017   RDW 18.0 (H) 10/14/2022 1017   LYMPHSABS 1.0 10/14/2022 1017   MONOABS 0.4 10/14/2022 1017   EOSABS 0.0 10/14/2022 1017   BASOSABS 0.0 10/14/2022 1017    CMP     Component Value Date/Time   NA 139 10/14/2022 1017   K 3.6 10/14/2022 1017   CL 105 10/14/2022 1017   CO2 28 10/14/2022 1017   GLUCOSE 83 10/14/2022 1017   BUN 6 (L) 10/14/2022 1017   CREATININE 0.43 (L) 10/14/2022 1017   CALCIUM 9.4 10/14/2022 1017   PROT 7.6 10/14/2022 1017   ALBUMIN 3.7 10/14/2022 1017   AST 12 (L) 10/14/2022 1017   ALT 6 10/14/2022 1017   ALKPHOS 78 10/14/2022 1017   BILITOT 0.5 10/14/2022 1017   GFRNONAA >60 10/14/2022 1017   GFRAA >60 12/22/2018 2150       ASSESSMENT and THERAPY PLAN:  Sydney Lee is a 61 y.o. 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   #Stage IIb triple negative breast cancer  --Currently receiving neoadjuvant chemotherapy with carbo/taxol and keytruda x 4 cycles followed by adriamycin, cytoxan and keytruda x 4 cycles.  -- She received 4 cycles of CarboTaxol Keytruda followed by 1 cycle of AC Keytruda. Baseline echo end of June with no concern for cardiac dysfunction.  She has no underlying coronary artery disease. -- Before cycle 1 of AC, she once again has no clinical concerns for cardiac compromise.  She only reported fatigue since the hospitalization and since she is on chemo and the just hospitalized, we wondered if the fatigue was posthospitalization.  I also ordered another echocardiogram when she came for her last visit.   This showed a significant drop in EF compared to the baseline echo.  I wonder if this could be immunotherapy related versus Takotsubo cardiomyopathy.  I do not believe this is Adriamycin  related because she only had 1 dose of it.  She once again has no clinical signs of cardiac compromise.  No evidence of right heart or left heart failure on exam.  However given the precipitous drop in EF, have discussed with the cardio oncology team who have kindly agreed to see her right away.  We will hold off on all treatment at this time.  She thankfully had complete clinical response.  Once we have further recommendations from cardiology team, I will engage with Dr. Barry Dienes to see if she can proceed with surgery sooner than later.  I have also started her on Medrol Dosepak just to see if this can be immunotherapy related and if this will help.  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  She reports no improvement in gabapentin whatsoever.  Hence have started her on Cymbalta 30 mg daily for 1 week followed by 60 mg daily. If she has no relief with this also I will send her to neurology for further recommendations.  She is agreeable with this.  #Situational depression, she lost 2 parents in a short time, her husband had amputation of the right leg and she is dealing with a lot personally as well as with her health.  This makes me wonder if she has any evidence of Takotsubo cardiomyopathy I also ordered some additional labs to look for any evidence of hypopituitarism, TSH and free T4 normal.  I requested a cortisol,  BN peptide and troponin today. Total time spent: 40 minutes including history, physical exam, review of records, counseling and coordination of care All questions were answered. The patient knows to call the clinic with any problems, questions or concerns. We can certainly see the patient much sooner if necessary.  I have spent a total of 30 minutes minutes of face-to-face and non-face-to-face time, preparing to see the patient, performing a medically appropriate examination, counseling and educating the patient, documenting clinical  information in the electronic health record, and care coordination.  Benay Pike MD

## 2022-10-14 NOTE — Progress Notes (Signed)
ECHO recently showed drop in EF.  We will hold treatment and send her to cardiology for further recommendations. I sent a referral to cardiology STAT.

## 2022-10-15 ENCOUNTER — Ambulatory Visit: Payer: BC Managed Care – PPO

## 2022-10-16 LAB — T4: T4, Total: 8.2 ug/dL (ref 4.5–12.0)

## 2022-10-17 ENCOUNTER — Ambulatory Visit (HOSPITAL_COMMUNITY)
Admission: RE | Admit: 2022-10-17 | Discharge: 2022-10-17 | Disposition: A | Discharge status: Home / Self Care | Payer: BC Managed Care – PPO | Source: Ambulatory Visit | Attending: Internal Medicine | Admitting: Internal Medicine

## 2022-10-17 ENCOUNTER — Encounter (HOSPITAL_COMMUNITY): Payer: Self-pay | Admitting: Internal Medicine

## 2022-10-17 ENCOUNTER — Telehealth (HOSPITAL_COMMUNITY): Payer: Self-pay | Admitting: Vascular Surgery

## 2022-10-17 VITALS — BP 118/76 | HR 94 | Wt 183.6 lb

## 2022-10-17 DIAGNOSIS — C50212 Malignant neoplasm of upper-inner quadrant of left female breast: Secondary | ICD-10-CM

## 2022-10-17 DIAGNOSIS — I509 Heart failure, unspecified: Secondary | ICD-10-CM | POA: Diagnosis not present

## 2022-10-17 DIAGNOSIS — Z171 Estrogen receptor negative status [ER-]: Secondary | ICD-10-CM

## 2022-10-17 DIAGNOSIS — I110 Hypertensive heart disease with heart failure: Secondary | ICD-10-CM | POA: Insufficient documentation

## 2022-10-17 DIAGNOSIS — Z87891 Personal history of nicotine dependence: Secondary | ICD-10-CM | POA: Insufficient documentation

## 2022-10-17 DIAGNOSIS — Z79899 Other long term (current) drug therapy: Secondary | ICD-10-CM | POA: Insufficient documentation

## 2022-10-17 DIAGNOSIS — Z01810 Encounter for preprocedural cardiovascular examination: Secondary | ICD-10-CM

## 2022-10-17 DIAGNOSIS — E119 Type 2 diabetes mellitus without complications: Secondary | ICD-10-CM | POA: Diagnosis not present

## 2022-10-17 DIAGNOSIS — I427 Cardiomyopathy due to drug and external agent: Secondary | ICD-10-CM

## 2022-10-17 DIAGNOSIS — C50912 Malignant neoplasm of unspecified site of left female breast: Secondary | ICD-10-CM | POA: Insufficient documentation

## 2022-10-17 DIAGNOSIS — Z7984 Long term (current) use of oral hypoglycemic drugs: Secondary | ICD-10-CM | POA: Insufficient documentation

## 2022-10-17 MED ORDER — METOPROLOL SUCCINATE ER 50 MG PO TB24: 50.0000 mg | ORAL_TABLET | Freq: Every evening | ORAL | 5 refills | Status: DC

## 2022-10-17 NOTE — Telephone Encounter (Signed)
Spoke to pt to make new chf appt w/ db / adi today this afternoon

## 2022-10-17 NOTE — Patient Instructions (Signed)
Good to see you today!    START Toprol 50 mg nightly   Your physician has requested that you have a cardiac MRI. Cardiac MRI uses a computer to create images of your heart as its beating, producing both still and moving pictures of your heart and major blood vessels. For further information please visit [email or web address] Please follow the instruction sheet given to you today for more information. We will schedule appointment once insurance approves   Your physician recommends that you schedule a follow-up appointment in: 4 weeks  If you have any questions or concerns before your next appointment please send [place] a message through [place] or call our office at [phone].    TO LEAVE A MESSAGE FOR THE NURSE SELECT OPTION 2, PLEASE LEAVE A MESSAGE INCLUDING: YOUR NAME DATE OF BIRTH CALL BACK NUMBER REASON FOR CALL**this is important as we prioritize the call backs  YOU WILL RECEIVE A CALL BACK THE SAME DAY AS LONG AS YOU CALL BEFORE 4:00 PM At the [facility], you and your health needs are our priority. As part of our continuing mission to provide you with exceptional heart care, we have created designated Provider Care Teams. These Care Teams include your primary Cardiologist (physician) and Advanced Practice Providers (APPs- Physician Assistants and Nurse Practitioners) who all work together to provide you with the care you need, when you need it.   You may see any of the following providers on your designated Care Team at your next follow up: Dr Glori Bickers Dr Loralie Champagne Dr. Roxana Hires, NP Lyda Jester, [place] [facility] [place], [place] Forestine Na, NP Audry Riles, PharmD   Please be sure to bring in all your medications bottles to every appointment.

## 2022-10-17 NOTE — Progress Notes (Signed)
CARDIO-ONCOLOGY CLINIC CONSULT NOTE  Referring Physician: Dr. Chryl Heck Primary Care: [name], MD Primary Cardiologist: New  HPI:  Sydney Lee is a 61 y.o. female with DM2, HTN, smoker (quit 1 month ago) left breast cancer referred by Dr. Chryl Heck for enrollment into the Cardio-Oncology program.  Diagnosed in 5/23. Stage IIB triple negative. Ki-67 80%  7/3- 10/07/22 treated with Pembrolizumab (200) D1 + Carboplatin (5) D1 + Paclitaxel (80) D1,8,15 q21d X 4 cycles  followed by Pembrolizumab (200) D1 + AC D1 q21d x 4 cycles (received 2 cycles of AC including adriamycin)   After cycle 5 D1, she was admitted (09/15/22-09/21/22) with streptococcal pneumonia infection/sepsis and was on antibiotics, required transfusion, felt severe fatigue, debilitated after.    Chest CT 09/17/22: No coronary calcifications  Denies any h/o known heart disease. Had DM2 and HTN but well controlled.  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    Labs on 11/10 hstrop 3, BNP 34  Feels ok. Denies CP or SOB. Under a lot of stress with death of her parents. No edema, orthopnea or PND.    ECHO 06/02/22 EF 55-60% GLS -22.4% ECHO: 10/11/22  read as 30-35%. I reviewed echo and feel like EF closer to 40-45%   Review of Systems: [y] = yes, _0  = no   General: Weight gain _1 ; Weight loss _2 ; Anorexia _3 ; Fatigue [ y]; Fever _4 ; Chills _5 ; Weakness _6   Cardiac: Chest pain/pressure _7 ; Resting SOB _8 ; Exertional SOB _9 ; Orthopnea _10 ; Pedal Edema _11 ; Palpitations _12 ; Syncope _13 ; Presyncope _14 ; Paroxysmal nocturnal dyspnea_15   Pulmonary: Cough _16 ; Wheezing_17 ; Hemoptysis_18 ; Sputum _19 ; Snoring _20   GI: Vomiting_21 ; Dysphagia_22 ; Melena_23 ; Hematochezia _24 ; Heartburn_25 ; Abdominal pain _26 ; Constipation _27 ; Diarrhea _28 ; BRBPR _29   GU: Hematuria_30 ; Dysuria _31 ; Nocturia_32   Vascular: Pain in legs with walking _33 ; Pain in feet with  lying flat _34 ; Non-healing sores _35 ; Stroke _36 ; TIA _37 ; Slurred speech _38 ;  Neuro: Headaches_39 ; Vertigo_40 ; Seizures_41 ; Paresthesias_42 ;Blurred vision _43 ; Diplopia _44 ; Vision changes _45   Ortho/Skin: Arthritis _46 ; Joint pain _47 ; Muscle pain _48 ; Joint swelling _49 ; Back Pain _50 ; Rash _51   Psych: Depression[y ]; Anxiety[ y]  Heme: Bleeding problems _52 ; Clotting disorders _53 ; Anemia _54   Endocrine: Diabetes _55 ; Thyroid dysfunction_56    Past Medical History:  Diagnosis Date   Anxiety    Arthritis    Breast cancer ([place])    Depression    Diabetes mellitus without complication ([facility])    Hypertension     Current Outpatient Medications  Medication Sig Dispense Refill   acetaminophen (TYLENOL) 500 MG tablet Take 1,000 mg by mouth See admin instructions. Take 1,000 mg by mouth one to four times a day for pain     ALPRAZolam (XANAX) 0.25 MG tablet Take 0.25 mg by mouth 2 (two) times daily as needed for anxiety.     atorvastatin (LIPITOR) 10 MG tablet Take 10 mg by mouth every Monday, Wednesday, and Friday.     DULoxetine (CYMBALTA) 30 MG capsule Take 1 capsule (30 mg total) by mouth daily. 1 capsule once daily for one week and then 2 a  day from there on. 60 capsule 3   JARDIANCE 10 MG TABS tablet Take 10 mg by mouth in the morning.     lidocaine-prilocaine (EMLA) cream Apply to affected area once (Patient taking differently: Apply 1 Application topically See admin instructions. Apply as directed for port access) 30 g 3   losartan (COZAAR) 100 MG tablet Take 100 mg by mouth in the morning.     meclizine (ANTIVERT) 25 MG tablet Take 25 mg by mouth 3 (three) times daily as needed for dizziness.     metFORMIN (GLUCOPHAGE) 1000 MG tablet Take 1,000 mg by mouth in the morning and at bedtime.     methylPREDNISolone (MEDROL DOSEPAK) 4 MG TBPK tablet As instructed 1 each 0   nicotine (NICODERM CQ - DOSED IN MG/24 HOURS) 14 mg/24hr patch Place 14 mg onto the skin daily as needed for smoking  cessation.     ondansetron (ZOFRAN) 8 MG tablet Take 1 tablet (8 mg total) by mouth 2 (two) times daily as needed. Start on the third day after carboplatin and AC chemotherapy. 30 tablet 1   OZEMPIC, 1 MG/DOSE, 4 MG/3ML SOPN Inject 1 mg into the skin every Sunday.     prochlorperazine (COMPAZINE) 10 MG tablet Take 1 tablet (10 mg total) by mouth every 6 (six) hours as needed (Nausea or vomiting). 30 tablet 1   No current facility-administered medications for this encounter.    Allergies  Allergen Reactions   Emend [Aprepitant] Shortness Of Breath and Other (See Comments)    Flushing and shortness of breath    Codeine Nausea Only and Other (See Comments)    "Sick on the stomach"   Latex Itching and Other (See Comments)    Gloves=itching/burning      Social History   Socioeconomic History   Marital status: Married    Spouse name: Not on file   Number of children: Not on file   Years of education: Not on file   Highest education level: Not on file  Occupational History   Not on file  Tobacco Use   Smoking status: Former    Packs/day: 1.00    Years: 40.00    Total pack years: 40.00    Types: Cigarettes    Quit date: 06/05/2022    Years since quitting: 0.3   Smokeless tobacco: Never  Substance and Sexual Activity   Alcohol use: No   Drug use: No   Sexual activity: Not on file  Other Topics Concern   Not on file  Social History Narrative   Not on file   Social Determinants of Health   Financial Resource Strain: High Risk (06/14/2022)   Overall Financial Resource Strain (CARDIA)    Difficulty of Paying Living Expenses: Hard  Food Insecurity: No Food Insecurity (09/15/2022)   Hunger Vital Sign    Worried About Running Out of Food in the Last Year: Never true    Ran Out of Food in the Last Year: Never true  Transportation Needs: No Transportation Needs (09/15/2022)   PRAPARE - Hydrologist (Medical): No    Lack of Transportation  (Non-Medical): No  Physical Activity: Not on file  Stress: Not on file  Social Connections: Not on file  Intimate Partner Violence: Not At Risk (09/16/2022)   Humiliation, Afraid, Rape, and Kick questionnaire    Fear of Current or Ex-Partner: No    Emotionally Abused: No    Physically Abused: No    Sexually Abused: No  Family History  Problem Relation Age of Onset   Leukemia Mother 40   Throat cancer Maternal Uncle 87   Prostate cancer Maternal Uncle    Breast cancer Cousin 39       maternal first cousin   Ovarian cancer Cousin        maternal first cousin   Breast cancer Cousin 43 - 110       paternal first cousin   Breast cancer Cousin 24 - 47       paternal first cousin    Vitals:   10/17/22 1344  BP: 118/76  Pulse: 94  SpO2: 98%  Weight: 83.3 kg (183 lb 9.6 oz)    PHYSICAL EXAM: General:  Well appearing. No respiratory difficulty HEENT: normal Neck: supple. no JVD. Carotids 2+ bilat; no bruits. No lymphadenopathy or thryomegaly appreciated. Cor: PMI nondisplaced. Regular rate & rhythm. No rubs, gallops or murmurs. Lungs: clear Abdomen: soft, nontender, nondistended. No hepatosplenomegaly. No bruits or masses. Good bowel sounds. Extremities: no cyanosis, clubbing, rash, edema Neuro: alert & oriented x 3, cranial nerves grossly intact. moves all 4 extremities w/o difficulty. Affect pleasant.  ECG: NSR No ST-T wave abnormalities. Personally reviewed   ASSESSMENT & PLAN:  1. Left Breast Cancer - 7/3- 10/07/22 treated with Pembrolizumab (200) D1 + Carboplatin (5) D1 + Paclitaxel (80) D1,8,15 q21d X 4 cycles  followed by Pembrolizumab (200) D1 + AC D1 q21d x 4 cycles (received 2 cycles of AC including adriamycin) - plan as below  2. Chemotherapy-induced cardiomyopathy - ECHO 06/02/22 EF 55-60% GLS -22.4% - ECHO11/7/23  read as 30-35%. I reviewed echo and feel like EF closer to 40-45% - Labs on 11/10 hstrop 3, BNP 34 - With normal hstrop active  pembrolizumab-related myocarditis very unlikely. Suspect likely adriamycin toxicity  - Doubt ischemic with no CAD on chest CT - Echo pattern not c/w Tako-Tsubo - Will check cMRI - Continue Jardiance and losartan - Add Toprol 50 - If cMRI with normalized EF and no evidence of myocarditis can consider very careful re-introduction of Keytruda. (Spoke with Dr. Chryl Heck who has planned to stop A/C so no further Adriamycin)  3. Pre-op risk stratification - pending L breast surgery and LN sampling  - with EF 45% and preserved cfunctional capacity she is at low risk for peri-op CV complications - Can proceed to OR from our standpoint  Glori Bickers, MD  2:36 PM

## 2022-10-20 ENCOUNTER — Telehealth: Payer: Self-pay | Admitting: *Deleted

## 2022-10-20 ENCOUNTER — Other Ambulatory Visit: Payer: Self-pay

## 2022-10-20 ENCOUNTER — Inpatient Hospital Stay (HOSPITAL_BASED_OUTPATIENT_CLINIC_OR_DEPARTMENT_OTHER): Payer: BC Managed Care – PPO | Admitting: Hematology and Oncology

## 2022-10-20 VITALS — BP 99/66 | HR 67 | Temp 97.2°F | Resp 17 | Wt 180.4 lb

## 2022-10-20 DIAGNOSIS — G620 Drug-induced polyneuropathy: Secondary | ICD-10-CM | POA: Diagnosis not present

## 2022-10-20 DIAGNOSIS — R931 Abnormal findings on diagnostic imaging of heart and coronary circulation: Secondary | ICD-10-CM

## 2022-10-20 DIAGNOSIS — F419 Anxiety disorder, unspecified: Secondary | ICD-10-CM | POA: Diagnosis not present

## 2022-10-20 DIAGNOSIS — Z5112 Encounter for antineoplastic immunotherapy: Secondary | ICD-10-CM | POA: Diagnosis not present

## 2022-10-20 DIAGNOSIS — C50212 Malignant neoplasm of upper-inner quadrant of left female breast: Secondary | ICD-10-CM | POA: Diagnosis not present

## 2022-10-20 DIAGNOSIS — Z171 Estrogen receptor negative status [ER-]: Secondary | ICD-10-CM

## 2022-10-20 DIAGNOSIS — T451X5A Adverse effect of antineoplastic and immunosuppressive drugs, initial encounter: Secondary | ICD-10-CM

## 2022-10-20 DIAGNOSIS — R630 Anorexia: Secondary | ICD-10-CM

## 2022-10-20 MED ORDER — PREDNISONE 5 MG PO TABS: 5.0000 mg | ORAL_TABLET | Freq: Every day | ORAL | 0 refills | Status: DC

## 2022-10-20 MED ORDER — ALPRAZOLAM 0.25 MG PO TABS: 0.2500 mg | ORAL_TABLET | Freq: Two times a day (BID) | ORAL | 0 refills | Status: AC | PRN

## 2022-10-20 NOTE — Telephone Encounter (Signed)
This RN spoke with pt per MD request due to discussion at visit for medication for appetite stimulation- medication Remeron unfortunately has interaction with pt's current Cymbalta. Dr Chryl Heck is prescribing low dose prednisone instead.  Above discussed with pt - no further questions or needs at this time.

## 2022-10-20 NOTE — Progress Notes (Signed)
[facility] Follow up:    Sydney Lee, [address] [place] [address]   DIAGNOSIS:  Cancer Staging  Malignant neoplasm of upper-inner quadrant of left breast in female, estrogen receptor negative ([employer]) Staging form: Breast, AJCC 8th Edition - Clinical: Stage IIB (cT2, cN0, cM0, G3, ER-, PR-, HER2-) - Signed by Benay Pike, MD on 05/25/2022 Stage prefix: Initial diagnosis Histologic grading system: 3 grade system   SUMMARY OF ONCOLOGIC HISTORY: Oncology History  Malignant neoplasm of upper-inner quadrant of left breast in female, estrogen receptor negative ([place])  04/27/2022 Mammogram   Diagnostic mammogram showed indeterminate solid mass in the 10:00 location of the left breast.  Small satellite nodule adjacent to the index mass.  Borderline lymph node has cortical thickening of 2.9 mm.  Other lymph nodes have normal morphology.   05/16/2022 Pathology Results   Left breast needle core biopsy showed invasive poorly differentiated adenocarcinoma, grade 3 with tumor necrosis, lymph node biopsy benign reactive, negative for carcinoma.  Prognostics from the tumor showed ER 0%, negative, PR 0%, negative, Ki-67 of 80% and HER2 negative   05/23/2022 Initial Diagnosis   Malignant neoplasm of upper-inner quadrant of left breast in female, estrogen receptor negative ([place])   05/25/2022 Cancer Staging   Staging form: Breast, AJCC 8th Edition - Clinical: Stage IIB (cT2, cN0, cM0, G3, ER-, PR-, HER2-) - Signed by Benay Pike, MD on 05/25/2022 Stage prefix: Initial diagnosis Histologic grading system: 3 grade system   05/25/2022 Genetic Testing   [facility] CancerNext-Expanded Panel was Negative. Report date was 06/02/2022.  The CancerNext-Expanded gene panel offered by [facility] and includes sequencing, rearrangement, and RNA analysis for the following 77 genes: AIP, ALK, APC, ATM, AXIN2, BAP1, BARD1, BLM, BMPR1A, BRCA1, BRCA2, BRIP1, CDC73, CDH1, CDK4,  CDKN1B, CDKN2A, CHEK2, CTNNA1, DICER1, FANCC, FH, FLCN, GALNT12, KIF1B, LZTR1, MAX, MEN1, MET, MLH1, MSH2, MSH3, MSH6, MUTYH, NBN, NF1, NF2, NTHL1, PALB2, PHOX2B, PMS2, POT1, PRKAR1A, PTCH1, PTEN, RAD51C, RAD51D, RB1, RECQL, RET, SDHA, SDHAF2, SDHB, SDHC, SDHD, SMAD4, SMARCA4, SMARCB1, SMARCE1, STK11, SUFU, TMEM127, TP53, TSC1, TSC2, VHL and XRCC2 (sequencing and deletion/duplication); EGFR, EGLN1, HOXB13, KIT, MITF, PDGFRA, POLD1, and POLE (sequencing only); EPCAM and GREM1 (deletion/duplication only).    06/02/2022 Initial Biopsy   Additional left axillary node biopsy   06/06/2022 - 08/02/2022 Chemotherapy   Patient is on Treatment Plan : BREAST Pembrolizumab (200) D1 + Carboplatin (5) D1 + Paclitaxel (80) D1,8,15 q21d X 4 cycles / Pembrolizumab (200) D1 + AC D1 q21d x 4 cycles     06/06/2022 - 10/07/2022 Chemotherapy   Patient is on Treatment Plan : BREAST Pembrolizumab (200) D1 + Carboplatin (5) D1 + Paclitaxel (80) D1,8,15 q21d X 4 cycles / Pembrolizumab (200) D1 + AC D1 q21d x 4 cycles       CURRENT THERAPY: Taxol/Carbo/Keytruda  INTERVAL HISTORY:  Sydney Lee 61 y.o. female returns for follow-up and evaluation prior to cycle 6 of day 1 of  AC Keytruda.  After cycle 5 D1, she was admitted with streptococcal pneumonia infection and was on antibiotics, required transfusion, felt severe fatigue, debilitated after.    When she came for her last visit, she was still recovering but willing to try more treatment.  She recently called that she continues to have a lot of fatigue and worsening neuropathy hence we brought her back in for some IV fluids.  I also asked for an echocardiogram because of this debilitating fatigue, she is here to  review the results.  She denies any chest pain, chest pressure, shortness of breath.  She is here for follow-up after her visit with cardiology.  She feels relieved after meeting Dr. Haroldine Laws who mentioned to her that the higher does not look as bad as it was  thought.  She tells me however that she is dealing with a lot of anxiety, cannot eat well, was hoping that we can refill her alprazolam.  She was also wondering if we can give her appetite stimulant.  Her energy is slowly getting better.  Her neuropathy is slightly better.  She denies any complaints today except for the stress. Rest of the pertinent 10 point ROS reviewed and negative  Patient Active Problem List   Diagnosis Date Noted   Fatigue due to treatment 10/13/2022   Malnutrition of moderate degree 09/17/2022   Severe sepsis with acute organ dysfunction ([place]) 09/15/2022   HTN (hypertension) 09/15/2022   Diabetes mellitus, type II ([place]) 09/15/2022   Anxiety 09/15/2022   Depression 09/15/2022   Port-A-Cath in place 06/06/2022   Infusion reaction 06/06/2022   Genetic testing 06/03/2022   Family history of breast cancer 05/25/2022   Family history of ovarian cancer 05/25/2022   Malignant neoplasm of upper-inner quadrant of left breast in female, estrogen receptor negative ([place]) 05/23/2022    is allergic to [facility] [aprepitant], codeine, and latex.  MEDICAL HISTORY: Past Medical History:  Diagnosis Date   Anxiety    Arthritis    Breast cancer ([place])    Depression    Diabetes mellitus without complication ([place])    Hypertension     SURGICAL HISTORY: Past Surgical History:  Procedure Laterality Date   PORTACATH PLACEMENT N/A 06/01/2022   Procedure: INSERTION PORT-A-CATH WITH ULTRASOUND GUIDANCE;  Surgeon: Stark Klein, MD;  Location: [facility];  Service: General;  Laterality: N/A;    SOCIAL HISTORY: Social History   Socioeconomic History   Marital status: Married    Spouse name: Not on file   Number of children: Not on file   Years of education: Not on file   Highest education level: Not on file  Occupational History   Not on file  Tobacco Use   Smoking status: Former    Packs/day: 1.00    Years: 40.00    Total pack years: 40.00    Types: Cigarettes    Quit date:  06/05/2022    Years since quitting: 0.3   Smokeless tobacco: Never  Substance and Sexual Activity   Alcohol use: No   Drug use: No   Sexual activity: Not on file  Other Topics Concern   Not on file  Social History Narrative   Not on file   Social Determinants of Health   Financial Resource Strain: High Risk (06/14/2022)   Overall Financial Resource Strain (CARDIA)    Difficulty of Paying Living Expenses: Hard  Food Insecurity: No Food Insecurity (09/15/2022)   Hunger Vital Sign    Worried About Running Out of Food in the Last Year: Never true    Ran Out of Food in the Last Year: Never true  Transportation Needs: No Transportation Needs (09/15/2022)   PRAPARE - Hydrologist (Medical): No    Lack of Transportation (Non-Medical): No  Physical Activity: Not on file  Stress: Not on file  Social Connections: Not on file  Intimate Partner Violence: Not At Risk (09/16/2022)   Humiliation, Afraid, Rape, and Kick questionnaire    Fear of Current or Ex-Partner: No  Emotionally Abused: No    Physically Abused: No    Sexually Abused: No    FAMILY HISTORY: Family History  Problem Relation Age of Onset   Leukemia Mother 53   Throat cancer Maternal Uncle 32   Prostate cancer Maternal Uncle    Breast cancer Cousin 48       maternal first cousin   Ovarian cancer Cousin        maternal first cousin   Breast cancer Cousin 39 - 31       paternal first cousin   Breast cancer Cousin 21 - 63       paternal first cousin    Review of Systems  Constitutional:  Negative for appetite change, chills, fatigue, fever and unexpected weight change.  HENT:   Negative for hearing loss, lump/mass and trouble swallowing.   Eyes:  Negative for eye problems and icterus.  Respiratory:  Negative for chest tightness, cough and shortness of breath.   Cardiovascular:  Negative for chest pain, leg swelling and palpitations.  Gastrointestinal:  Negative for abdominal distention,  abdominal pain, constipation, diarrhea, nausea and vomiting.  Endocrine: Negative for hot flashes.  Genitourinary:  Negative for difficulty urinating.   Musculoskeletal:  Negative for arthralgias.  Skin:  Negative for itching and rash.  Neurological:  Negative for dizziness, extremity weakness, headaches and numbness.  Hematological:  Negative for adenopathy. Does not bruise/bleed easily.  Psychiatric/Behavioral:  Positive for depression. The patient is not nervous/anxious.       PHYSICAL EXAMINATION  ECOG PERFORMANCE STATUS: 1 - Symptomatic but completely ambulatory  Vitals:   10/20/22 1323  BP: 99/66  Pulse: 67  Resp: 17  Temp: (!) 97.2 F (36.2 C)  SpO2: 99%    Physical Exam Constitutional:      General: She is not in acute distress.    Appearance: Normal appearance. She is not toxic-appearing.     Comments: She is in a wheelchair today mostly because of neuropathy  HENT:     Head: Normocephalic and atraumatic.  Eyes:     General: No scleral icterus. Chest:     Comments:    Abdominal:     General: There is no distension.     Tenderness: There is no abdominal tenderness.  Musculoskeletal:        General: No swelling.     Cervical back: Neck supple.  Lymphadenopathy:     Cervical: No cervical adenopathy.  Skin:    General: Skin is warm and dry.     Findings: No rash.  Neurological:     Mental Status: She is alert.  Psychiatric:        Mood and Affect: Mood normal.        Behavior: Behavior normal.    Breast exam deferred today LABORATORY DATA:  CBC    Component Value Date/Time   WBC 2.6 (L) 10/14/2022 1017   WBC 11.3 (H) 09/21/2022 0310   RBC 2.64 (L) 10/14/2022 1017   HGB 8.6 (L) 10/14/2022 1017   HCT 25.9 (L) 10/14/2022 1017   PLT 104 (L) 10/14/2022 1017   MCV 98.1 10/14/2022 1017   MCH 32.6 10/14/2022 1017   MCHC 33.2 10/14/2022 1017   RDW 18.0 (H) 10/14/2022 1017   LYMPHSABS 1.0 10/14/2022 1017   MONOABS 0.4 10/14/2022 1017   EOSABS 0.0  10/14/2022 1017   BASOSABS 0.0 10/14/2022 1017    CMP     Component Value Date/Time   NA 139 10/14/2022 1017   K 3.6  10/14/2022 1017   CL 105 10/14/2022 1017   CO2 28 10/14/2022 1017   GLUCOSE 83 10/14/2022 1017   BUN 6 (L) 10/14/2022 1017   CREATININE 0.43 (L) 10/14/2022 1017   CALCIUM 9.4 10/14/2022 1017   PROT 7.6 10/14/2022 1017   ALBUMIN 3.7 10/14/2022 1017   AST 12 (L) 10/14/2022 1017   ALT 6 10/14/2022 1017   ALKPHOS 78 10/14/2022 1017   BILITOT 0.5 10/14/2022 1017   GFRNONAA >60 10/14/2022 1017   GFRAA >60 12/22/2018 2150       ASSESSMENT and THERAPY PLAN:  Sydney Lee is a 61 y.o. 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   #Stage IIb triple negative breast cancer  --Currently receiving neoadjuvant chemotherapy with carbo/taxol and keytruda x 4 cycles followed by adriamycin, cytoxan and keytruda x 4 cycles.  -- She received 4 cycles of CarboTaxol Keytruda followed by 1 cycle of AC Keytruda. Baseline echo end of June with no concern for cardiac dysfunction.  She has no underlying coronary artery disease. -- Before cycle 1 of AC, she once again has no clinical concerns for cardiac compromise.  She only reported fatigue since the hospitalization and since she is on chemo and the just hospitalized, we wondered if the fatigue was posthospitalization.  I also ordered another echocardiogram when she came for her last visit.   This showed a significant drop in EF compared to the baseline echo.  I wonder if this could be immunotherapy related versus Takotsubo cardiomyopathy.  I do not believe this is Adriamycin related because she only had 1 dose of it.  She once again has no clinical signs of cardiac compromise.  No evidence of right heart or left heart failure on exam.  However given the precipitous drop in EF, have discussed with the cardio oncology team who have kindly agreed to see her right away.  Dr. Haroldine Laws has seen the patient.   His recommendations were to consider an MRI of her heart and follow her with close surveillance if we decide to restart Keytruda.  He mentioned to me that his EF read as about 45% or so and it is not as low as it was mentioned in the original echo report.  He felt that this could be related to the [facility].  However he was relieved that there is no myocarditis and she has normal troponin and BN peptide.  Unfortunately her MRI of the heart has not been scheduled until February.  I will try to reach out to him again if we can have this expedited by any means.  I will also reach out to Dr. Marlowe Aschoff team if she can be considered for surgery sooner.  Per my discussion with Dr. Haroldine Laws, she should be cleared to go to surgery.  Postsurgery, I may have to the address the issue if you want to try Keytruda adjuvant versus adjuvant Xeloda.  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  She reports no improvement in gabapentin whatsoever.  Hence have started her on Cymbalta 30 mg daily for 1 week followed by 60 mg daily.  She feels some improvement. =  #Situational depression, she lost 2 parents in a short time, her husband had amputation of the right leg and she is dealing with a lot personally as well as with her health.  This makes me wonder if she has any evidence of Takotsubo cardiomyopathy.  From my discussion with Dr. Haroldine Laws,  there is no evidence of Takotsubo cardiomyopathy on echocardiogram.  #Situational anxiety, refilled her alprazolam.  #Loss of appetite.  I have considered Remeron but this has interaction with Cymbalta.  I do not want to give her any Megace because of risk of blood clots with her sedentary status.  Hence have started on prednisone 5 mg p.o. daily.  We will readdress this in about a few days and then slowly taper it off.  Total time spent: 40 minutes including history, physical exam, review of records, counseling and coordination of  care All questions were answered. The patient knows to call the clinic with any problems, questions or concerns. We can certainly see the patient much sooner if necessary. Benay Pike MD

## 2022-10-26 ENCOUNTER — Ambulatory Visit: Payer: BC Managed Care – PPO

## 2022-10-26 ENCOUNTER — Telehealth: Payer: Self-pay | Admitting: *Deleted

## 2022-10-26 ENCOUNTER — Encounter: Payer: Self-pay | Admitting: *Deleted

## 2022-10-26 ENCOUNTER — Other Ambulatory Visit: Payer: BC Managed Care – PPO

## 2022-10-26 ENCOUNTER — Ambulatory Visit: Payer: BC Managed Care – PPO | Admitting: Hematology and Oncology

## 2022-10-26 ENCOUNTER — Other Ambulatory Visit: Payer: Self-pay | Admitting: *Deleted

## 2022-10-26 DIAGNOSIS — C50212 Malignant neoplasm of upper-inner quadrant of left female breast: Secondary | ICD-10-CM

## 2022-10-26 NOTE — Telephone Encounter (Signed)
Spoke with patient to inform her that we need to get a breast MRI post neoadjuvant chemo to assess response to treatment. Orders placed and let her know that someone will call her with an appt. Patient verbalized understanding.

## 2022-10-28 ENCOUNTER — Ambulatory Visit: Payer: BC Managed Care – PPO

## 2022-11-02 ENCOUNTER — Ambulatory Visit
Admission: RE | Admit: 2022-11-02 | Discharge: 2022-11-02 | Disposition: A | Discharge status: Home / Self Care | Payer: BC Managed Care – PPO | Source: Ambulatory Visit | Attending: Hematology and Oncology | Admitting: Hematology and Oncology

## 2022-11-02 ENCOUNTER — Telehealth (HOSPITAL_COMMUNITY): Payer: Self-pay | Admitting: *Deleted

## 2022-11-02 DIAGNOSIS — Z171 Estrogen receptor negative status [ER-]: Secondary | ICD-10-CM

## 2022-11-02 MED ORDER — GADOPICLENOL 0.5 MMOL/ML IV SOLN
9.0000 mL | Freq: Once | INTRAVENOUS | Status: AC | PRN
  Administered 2022-11-02: 9 mL via INTRAVENOUS

## 2022-11-03 ENCOUNTER — Encounter: Payer: Self-pay | Admitting: *Deleted

## 2022-11-08 ENCOUNTER — Ambulatory Visit (INDEPENDENT_AMBULATORY_CARE_PROVIDER_SITE_OTHER): Payer: BC Managed Care – PPO | Admitting: Psychologist

## 2022-11-08 DIAGNOSIS — Z634 Disappearance and death of family member: Secondary | ICD-10-CM | POA: Diagnosis not present

## 2022-11-08 DIAGNOSIS — F330 Major depressive disorder, recurrent, mild: Secondary | ICD-10-CM | POA: Diagnosis not present

## 2022-11-08 DIAGNOSIS — F411 Generalized anxiety disorder: Secondary | ICD-10-CM | POA: Diagnosis not present

## 2022-11-08 NOTE — Progress Notes (Signed)
[facility] Counselor Initial Adult Exam  Name: Sydney Lee Date: 11/08/2022 MRN: [id number] DOB: [birth year] PCP: [name], MD  Time spent: 01:05 pm to 01:45 pm; total time: 40 minutes  This session was held via video webex teletherapy due to the coronavirus risk at this time. The patient consented to video teletherapy and was located at her home during this session. She is aware it is the responsibility of the patient to secure confidentiality on her end of the session. The provider was in a private home office for the duration of this session. Limits of confidentiality were discussed with the patient.   Guardian/Payee:  NA    Paperwork requested: No   Reason for Visit /Presenting Problem: Depression and anxiety secondary to breast cancer  Mental Status Exam: Appearance:   Well Groomed     Behavior:  Appropriate  Motor:  Normal  Speech/Language:   Clear and Coherent  Affect:  Appropriate  Mood:  normal  Thought process:  normal  Thought content:    WNL  Sensory/Perceptual disturbances:    WNL  Orientation:  oriented to person, place, and time/date  Attention:  Good  Concentration:  Good  Memory:  WNL  Fund of knowledge:   Good  Insight:    Good  Judgment:   Good  Impulse Control:  Good    Reported Symptoms:  The patient endorsed experiencing the following: racing thoughts, feeling on  edge, feeling restless, unable to control worries, and easily feeling overwhelmed. She denied suicidal and homicidal ideation.   The patient endorsed experiencing the following: feeling down, sad, tearful, social isolation, avoiding pleasurable activities, fatigue, lack of motivation, rumination of thoughts, feeling down and hopeless. She denied suicidal and homicidal ideation.   Risk Assessment: Danger to Self:  No Self-injurious Behavior: No Danger to Others: No Duty to Warn:no Physical Aggression / Violence:No  Access to Firearms a concern: No  Gang Involvement:No   Patient / guardian was educated about steps to take if suicide or homicide risk level increases between visits: n/a While future psychiatric events cannot be accurately predicted, the patient does not currently require acute inpatient psychiatric care and does not currently meet [facility] involuntary commitment criteria.  Substance Abuse History: Current substance abuse: No     Past Psychiatric History:   Previous psychological history is significant for anxiety Outpatient Providers:NA History of Psych Hospitalization: No  Psychological Testing:  NA    Abuse History:  Victim of: No.,  NA    Report needed: No. Victim of Neglect:No. Perpetrator of  NA   Witness / Exposure to Domestic Violence: No   Protective Services Involvement: No  Witness to [employer] Violence:  No   Family History:  Family History  Problem Relation Age of Onset   Leukemia Mother 28   Throat cancer Maternal Uncle 85   Prostate cancer Maternal Uncle    Breast cancer Cousin 52       maternal first cousin   Ovarian cancer Cousin        maternal first cousin   Breast cancer Cousin 28 - 63       paternal first cousin   Breast cancer Cousin 56 - 36       paternal first cousin    Living situation: the patient lives with their family  Sexual Orientation: Straight  Relationship Status: married  Name of spouse / other:Clarence. Married for 44 years.  If a parent, number of children / ages:Patient has a 32 year  old daughter and a granddaughter who is two years old. Patient had an older daughter who died.   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 History of legal issue / charges:  NA  Medical History/Surgical History: reviewed Past Medical History:  Diagnosis Date   Anxiety    Arthritis    Breast cancer ([place])    Depression    Diabetes mellitus without complication ([place])    Hypertension     Past Surgical History:  Procedure Laterality Date   PORTACATH PLACEMENT N/A 06/01/2022   Procedure: INSERTION PORT-A-CATH WITH ULTRASOUND GUIDANCE;  Surgeon: Stark Klein, MD;  Location: [facility];  Service: General;  Laterality: N/A;    Medications: Current Outpatient Medications  Medication Sig Dispense Refill   acetaminophen (TYLENOL) 500 MG tablet Take 1,000 mg by mouth See admin instructions. Take 1,000 mg by mouth one to four times a day for pain     ALPRAZolam (XANAX) 0.25 MG tablet Take 1 tablet (0.25 mg total) by mouth 2 (two) times daily as needed for anxiety. 30 tablet 0   atorvastatin (LIPITOR) 10 MG tablet Take 10 mg by mouth every Monday, Wednesday, and Friday.     DULoxetine (CYMBALTA) 30 MG capsule Take 1 capsule (30 mg total) by mouth daily. 1 capsule once daily for one week and then 2 a day from there on. 60 capsule 3   JARDIANCE 10 MG TABS tablet Take 10 mg by mouth in the morning.     lidocaine-prilocaine (EMLA) cream Apply to affected area once (Patient taking differently: Apply 1 Application topically See admin instructions. Apply as directed for port access) 30 g 3   losartan (COZAAR) 100 MG tablet Take 100 mg by mouth in the morning.     meclizine (ANTIVERT) 25 MG tablet Take 25 mg by mouth 3 (three) times daily as needed for dizziness.     metFORMIN (GLUCOPHAGE) 1000 MG tablet Take 1,000 mg by mouth in the morning and at bedtime.     metoprolol succinate (TOPROL XL) 50 MG 24 hr tablet Take 1 tablet (50 mg total) by mouth  at bedtime. Take with or immediately following a meal. 30 tablet 5   nicotine (NICODERM CQ - DOSED IN MG/24 HOURS) 14 mg/24hr patch Place 14 mg onto the skin daily as needed for smoking cessation.     ondansetron (ZOFRAN) 8 MG tablet Take 1 tablet (8 mg total) by mouth 2 (two) times daily as needed. Start on the third day after carboplatin and AC chemotherapy. 30 tablet 1   OZEMPIC, 1 MG/DOSE, 4 MG/3ML SOPN Inject 1 mg into the skin every Sunday.     predniSONE (DELTASONE) 5 MG tablet Take 1 tablet (5 mg total) by mouth daily with breakfast. 30 tablet 0   prochlorperazine (COMPAZINE) 10 MG tablet Take 1 tablet (10 mg total) by mouth every 6 (six) hours as needed (Nausea or vomiting). 30 tablet 1   No current facility-administered medications for this visit.    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   Plan of Care: The patient is a 61 year old Black woman who was referred due to experiencing anxiety and depression secondary to breast cancer. The patient lives at home with her family. The patient meets criteria for a diagnosis of F33.0 major depressive affective disorder, recurrent, mild based off of the following: feeling down, sad, tearful, social isolation, avoiding pleasurable activities, fatigue, lack of motivation, rumination of thoughts, feeling down and hopeless. She denied suicidal and homicidal ideation. The patient meets criteria for a diagnosis of F41.1 generalized anxiety disorder based off of the following: racing thoughts, feeling on  edge, feeling restless, unable to control worries, and easily feeling  overwhelmed. She denied suicidal and homicidal ideation. She also meets criteria for R42.7 uncomplicated bereavement.   The patient stated that she wants to process thoughts and emotions.   This psychologist makes the recommendation that the patient participate in counseling bi-weekly if possible.    Conception Chancy, PsyD

## 2022-11-08 NOTE — Progress Notes (Signed)
                Fritzi Scripter, PsyD 

## 2022-11-09 ENCOUNTER — Encounter: Payer: Self-pay | Admitting: *Deleted

## 2022-11-10 ENCOUNTER — Encounter: Payer: Self-pay | Admitting: *Deleted

## 2022-11-10 ENCOUNTER — Telehealth: Payer: Self-pay | Admitting: *Deleted

## 2022-11-10 NOTE — Telephone Encounter (Signed)
Connected with ELY SPRAGG, [phone] (home) as advised per 1815 collaborative message yesterday "stating she needed an updated letter for her job".  "Last day of work was 08/30/2026.  Spoke with Vista Mink with [employer] and was told I need a letter stating I have been out of work since 08/31/2022 to present and ongoing.  Letter needs to be faxed to her attention to fax no.# [phone] .  Also unable to return to work due to surgery.  Waiting to hear from Dr. Barry Dienes with a date for surgery."  Advised this nurse will notify provider's nurse as form staff does not create letters. Currently no further questions or needs.

## 2022-11-15 ENCOUNTER — Telehealth (HOSPITAL_COMMUNITY): Payer: Self-pay | Admitting: *Deleted

## 2022-11-15 ENCOUNTER — Encounter (HOSPITAL_COMMUNITY): Payer: Self-pay | Admitting: Internal Medicine

## 2022-11-15 ENCOUNTER — Ambulatory Visit (HOSPITAL_COMMUNITY)
Admission: RE | Admit: 2022-11-15 | Discharge: 2022-11-15 | Disposition: A | Discharge status: Home / Self Care | Payer: BC Managed Care – PPO | Source: Ambulatory Visit | Attending: Internal Medicine | Admitting: Internal Medicine

## 2022-11-15 VITALS — BP 100/60 | HR 82 | Wt 188.4 lb

## 2022-11-15 DIAGNOSIS — Z01810 Encounter for preprocedural cardiovascular examination: Secondary | ICD-10-CM | POA: Diagnosis not present

## 2022-11-15 DIAGNOSIS — C50212 Malignant neoplasm of upper-inner quadrant of left female breast: Secondary | ICD-10-CM | POA: Diagnosis not present

## 2022-11-15 DIAGNOSIS — I10 Essential (primary) hypertension: Secondary | ICD-10-CM | POA: Insufficient documentation

## 2022-11-15 DIAGNOSIS — Z7984 Long term (current) use of oral hypoglycemic drugs: Secondary | ICD-10-CM | POA: Insufficient documentation

## 2022-11-15 DIAGNOSIS — T451X5A Adverse effect of antineoplastic and immunosuppressive drugs, initial encounter: Secondary | ICD-10-CM

## 2022-11-15 DIAGNOSIS — Z87891 Personal history of nicotine dependence: Secondary | ICD-10-CM | POA: Insufficient documentation

## 2022-11-15 DIAGNOSIS — C50912 Malignant neoplasm of unspecified site of left female breast: Secondary | ICD-10-CM | POA: Insufficient documentation

## 2022-11-15 DIAGNOSIS — E119 Type 2 diabetes mellitus without complications: Secondary | ICD-10-CM | POA: Diagnosis not present

## 2022-11-15 DIAGNOSIS — Z79899 Other long term (current) drug therapy: Secondary | ICD-10-CM | POA: Diagnosis not present

## 2022-11-15 DIAGNOSIS — I427 Cardiomyopathy due to drug and external agent: Secondary | ICD-10-CM | POA: Diagnosis not present

## 2022-11-15 DIAGNOSIS — Z171 Estrogen receptor negative status [ER-]: Secondary | ICD-10-CM | POA: Insufficient documentation

## 2022-11-15 NOTE — Progress Notes (Signed)
CARDIO-ONCOLOGY CLINIC CONSULT NOTE  Referring Physician: Dr. Chryl Heck Primary Care: Sydney Beard, MD Primary Cardiologist: New  HPI:  Sydney Lee is a 61 y.o. female with DM2, HTN, smoker (quit 1 month ago) left breast cancer referred by Dr. Chryl Heck for enrollment into the Cardio-Oncology program.  Diagnosed in 5/23. Stage IIB triple negative. Ki-67 80%  7/3- 10/07/22 treated with Pembrolizumab (200) D1 + Carboplatin (5) D1 + Paclitaxel (80) D1,8,15 q21d X 4 cycles  followed by Pembrolizumab (200) D1 + AC D1 q21d x 4 cycles (received 2 cycles of AC including adriamycin)   After cycle 5 D1, she was admitted (09/15/22-09/21/22) with streptococcal pneumonia infection/sepsis and was on antibiotics, required transfusion, felt severe fatigue, debilitated after.    Chest CT 09/17/22: No coronary calcifications  Denies any h/o known heart disease. Had DM2 and HTN but well controlled.  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    Labs on 11/10 hstrop 3, BNP 34  Here for f/u. Feels ok. Says she is taking it easy. Denies CP, SOB, edema,    ECHO 06/02/22 EF 55-60% GLS -22.4% ECHO: 10/11/22  read as 30-35%. I reviewed echo and feel like EF closer to 40-45%  Past Medical History:  Diagnosis Date   Anxiety    Arthritis    Breast cancer ([place])    Depression    Diabetes mellitus without complication ([place])    Hypertension     Current Outpatient Medications  Medication Sig Dispense Refill   acetaminophen (TYLENOL) 500 MG tablet Take 1,000 mg by mouth See admin instructions. Take 1,000 mg by mouth one to four times a day for pain     ALPRAZolam (XANAX) 0.25 MG tablet Take 1 tablet (0.25 mg total) by mouth 2 (two) times daily as needed for anxiety. 30 tablet 0   atorvastatin (LIPITOR) 10 MG tablet Take 10 mg by mouth every Monday, Wednesday, and Friday.     DULoxetine (CYMBALTA) 30 MG capsule Take 30 mg by mouth 2 (two) times  daily.     JARDIANCE 10 MG TABS tablet Take 10 mg by mouth in the morning.     lidocaine-prilocaine (EMLA) cream Apply to affected area once 30 g 3   losartan (COZAAR) 100 MG tablet Take 100 mg by mouth in the morning.     meclizine (ANTIVERT) 25 MG tablet Take 25 mg by mouth 3 (three) times daily as needed for dizziness.     metFORMIN (GLUCOPHAGE) 1000 MG tablet Take 1,000 mg by mouth in the morning and at bedtime.     metoprolol succinate (TOPROL XL) 50 MG 24 hr tablet Take 1 tablet (50 mg total) by mouth at bedtime. Take with or immediately following a meal. 30 tablet 5   ondansetron (ZOFRAN) 8 MG tablet Take 1 tablet (8 mg total) by mouth 2 (two) times daily as needed. Start on the third day after carboplatin and AC chemotherapy. 30 tablet 1   OZEMPIC, 1 MG/DOSE, 4 MG/3ML SOPN Inject 1 mg into the skin every Sunday.     predniSONE (DELTASONE) 5 MG tablet Take 1 tablet (5 mg total) by mouth daily with breakfast. 30 tablet 0   prochlorperazine (COMPAZINE) 10 MG tablet Take 1 tablet (10 mg total) by mouth every 6 (six) hours as needed (Nausea or vomiting). 30 tablet 1   No current facility-administered medications for this encounter.    Allergies  Allergen Reactions  Emend [Aprepitant] Shortness Of Breath and Other (See Comments)    Flushing and shortness of breath    Codeine Nausea Only and Other (See Comments)    "Sick on the stomach"   Latex Itching and Other (See Comments)    Gloves=itching/burning      Social History   Socioeconomic History   Marital status: Married    Spouse name: Not on file   Number of children: Not on file   Years of education: Not on file   Highest education level: Not on file  Occupational History   Not on file  Tobacco Use   Smoking status: Former    Packs/day: 1.00    Years: 40.00    Total pack years: 40.00    Types: Cigarettes    Quit date: 06/05/2022    Years since quitting: 0.4   Smokeless tobacco: Never  Substance and Sexual Activity    Alcohol use: No   Drug use: No   Sexual activity: Not on file  Other Topics Concern   Not on file  Social History Narrative   Not on file   Social Determinants of Health   Financial Resource Strain: High Risk (06/14/2022)   Overall Financial Resource Strain (CARDIA)    Difficulty of Paying Living Expenses: Hard  Food Insecurity: No Food Insecurity (09/15/2022)   Hunger Vital Sign    Worried About Running Out of Food in the Last Year: Never true    Ran Out of Food in the Last Year: Never true  Transportation Needs: No Transportation Needs (09/15/2022)   PRAPARE - Hydrologist (Medical): No    Lack of Transportation (Non-Medical): No  Physical Activity: Not on file  Stress: Not on file  Social Connections: Not on file  Intimate Partner Violence: Not At Risk (09/16/2022)   Humiliation, Afraid, Rape, and Kick questionnaire    Fear of Current or Ex-Partner: No    Emotionally Abused: No    Physically Abused: No    Sexually Abused: No      Family History  Problem Relation Age of Onset   Leukemia Mother 39   Throat cancer Maternal Uncle 85   Prostate cancer Maternal Uncle    Breast cancer Cousin 58       maternal first cousin   Ovarian cancer Cousin        maternal first cousin   Breast cancer Cousin 69 - 44       paternal first cousin   Breast cancer Cousin 104 - 31       paternal first cousin    Vitals:   11/15/22 0851  BP: 100/60  Pulse: 82  SpO2: 99%  Weight: 85.5 kg (188 lb 6.4 oz)    PHYSICAL EXAM: General:  Well appearing. No resp difficulty HEENT: normal Neck: supple. no JVD. Carotids 2+ bilat; no bruits. No lymphadenopathy or thryomegaly appreciated. Cor: PMI nondisplaced. Regular rate & rhythm. No rubs, gallops or murmurs. Lungs: clear Abdomen: soft, nontender, nondistended. No hepatosplenomegaly. No bruits or masses. Good bowel sounds. Extremities: no cyanosis, clubbing, rash, edema Neuro: alert & orientedx3, cranial nerves  grossly intact. moves all 4 extremities w/o difficulty. Affect pleasant    ASSESSMENT & PLAN:  1. Left Breast Cancer - 7/3- 10/07/22 treated with Pembrolizumab (200) D1 + Carboplatin (5) D1 + Paclitaxel (80) D1,8,15 q21d X 4 cycles  followed by Pembrolizumab (200) D1 + AC D1 q21d x 4 cycles (received 2 cycles of AC including adriamycin) -  Has completed therapy. Plan as below  2. Chemotherapy-induced cardiomyopathy - ECHO 06/02/22 EF 55-60% GLS -22.4% - ECHO11/7/23  read as 30-35%. I reviewed echo and feel like EF closer to 40-45% - Labs on 11/10 hstrop 3, BNP 34 - With normal hstrop active pembrolizumab-related myocarditis very unlikely. Suspect likely adriamycin toxicity  - Doubt ischemic with no CAD on chest CT - Echo pattern not c/w [place] - Pending cardiac MRI  tomorrow  - Continue Jardiance and losartan - Continue Toprol 50 - BP too low for spiro  - If cMRI with normalized EF and no evidence of myocarditis can consider very careful re-introduction of Keytruda. (Spoke with Dr. Chryl Heck who has planned to stop A/C so no further Adriamycin)  3. DM2 - on Ozempic and Jardiance  4. Pre-op risk stratification - pending L breast surgery and LN sampling  - with EF 45% and preserved functional capacity she is at low risk for peri-op CV complications - Can proceed to OR from our standpoint    Glori Bickers, MD  9:05 AM

## 2022-11-15 NOTE — Addendum Note (Signed)
Encounter addended by: Scarlette Calico, RN on: 11/15/2022 9:13 AM  Actions taken: Clinical Note Signed

## 2022-11-15 NOTE — Telephone Encounter (Signed)
Reaching out to patient to offer assistance regarding upcoming cardiac imaging study; pt verbalizes understanding of appt date/time, parking situation and where to check in, and verified current allergies; name and call back number provided for further questions should they arise  Gordy Clement RN Navigator Cardiac Imaging [facility] [phone] office [phone] cell  Patient has had an MRI w/ contrast in the past without incident.

## 2022-11-15 NOTE — Patient Instructions (Signed)
Your physician recommends that you schedule a follow-up appointment in: 3 months  Do the following things EVERYDAY: Weigh yourself in the morning before breakfast. Write it down and keep it in a log. Take your medicines as prescribed Eat low salt foods--Limit salt (sodium) to 2000 mg per day.  Stay as active as you can everyday Limit all fluids for the day to less than 2 liters  If you have any questions or concerns before your next appointment please send [place] a message through [place] or call our office at [phone].    TO LEAVE A MESSAGE FOR THE NURSE SELECT OPTION 2, PLEASE LEAVE A MESSAGE INCLUDING: YOUR NAME DATE OF BIRTH CALL BACK NUMBER REASON FOR CALL**this is important as we prioritize the call backs  YOU WILL RECEIVE A CALL BACK THE SAME DAY AS LONG AS YOU CALL BEFORE 4:00 PM

## 2022-11-16 ENCOUNTER — Inpatient Hospital Stay: Payer: BC Managed Care – PPO | Attending: Physician Assistant | Admitting: Hematology and Oncology

## 2022-11-16 ENCOUNTER — Other Ambulatory Visit (HOSPITAL_COMMUNITY): Payer: Self-pay | Admitting: Internal Medicine

## 2022-11-16 ENCOUNTER — Inpatient Hospital Stay: Payer: BC Managed Care – PPO

## 2022-11-16 ENCOUNTER — Telehealth: Payer: Self-pay | Admitting: *Deleted

## 2022-11-16 ENCOUNTER — Ambulatory Visit (HOSPITAL_BASED_OUTPATIENT_CLINIC_OR_DEPARTMENT_OTHER)
Admission: RE | Admit: 2022-11-16 | Discharge: 2022-11-16 | Disposition: A | Discharge status: Home / Self Care | Payer: BC Managed Care – PPO | Source: Ambulatory Visit | Attending: Internal Medicine | Admitting: Internal Medicine

## 2022-11-16 ENCOUNTER — Ambulatory Visit: Payer: BC Managed Care – PPO

## 2022-11-16 VITALS — BP 106/73 | HR 74 | Temp 97.5°F | Resp 16 | Ht 69.0 in | Wt 185.1 lb

## 2022-11-16 DIAGNOSIS — Z8041 Family history of malignant neoplasm of ovary: Secondary | ICD-10-CM | POA: Insufficient documentation

## 2022-11-16 DIAGNOSIS — G620 Drug-induced polyneuropathy: Secondary | ICD-10-CM | POA: Diagnosis not present

## 2022-11-16 DIAGNOSIS — Z171 Estrogen receptor negative status [ER-]: Secondary | ICD-10-CM

## 2022-11-16 DIAGNOSIS — I509 Heart failure, unspecified: Secondary | ICD-10-CM

## 2022-11-16 DIAGNOSIS — Z87891 Personal history of nicotine dependence: Secondary | ICD-10-CM | POA: Diagnosis not present

## 2022-11-16 DIAGNOSIS — C50212 Malignant neoplasm of upper-inner quadrant of left female breast: Secondary | ICD-10-CM | POA: Diagnosis present

## 2022-11-16 DIAGNOSIS — Z9221 Personal history of antineoplastic chemotherapy: Secondary | ICD-10-CM | POA: Diagnosis not present

## 2022-11-16 DIAGNOSIS — T451X5A Adverse effect of antineoplastic and immunosuppressive drugs, initial encounter: Secondary | ICD-10-CM | POA: Insufficient documentation

## 2022-11-16 DIAGNOSIS — R931 Abnormal findings on diagnostic imaging of heart and coronary circulation: Secondary | ICD-10-CM

## 2022-11-16 DIAGNOSIS — Z79899 Other long term (current) drug therapy: Secondary | ICD-10-CM | POA: Diagnosis not present

## 2022-11-16 DIAGNOSIS — R630 Anorexia: Secondary | ICD-10-CM | POA: Diagnosis not present

## 2022-11-16 DIAGNOSIS — Z803 Family history of malignant neoplasm of breast: Secondary | ICD-10-CM | POA: Insufficient documentation

## 2022-11-16 DIAGNOSIS — Z8042 Family history of malignant neoplasm of prostate: Secondary | ICD-10-CM | POA: Insufficient documentation

## 2022-11-16 DIAGNOSIS — Z808 Family history of malignant neoplasm of other organs or systems: Secondary | ICD-10-CM | POA: Insufficient documentation

## 2022-11-16 DIAGNOSIS — F419 Anxiety disorder, unspecified: Secondary | ICD-10-CM

## 2022-11-16 DIAGNOSIS — Z806 Family history of leukemia: Secondary | ICD-10-CM | POA: Insufficient documentation

## 2022-11-16 DIAGNOSIS — C50912 Malignant neoplasm of unspecified site of left female breast: Secondary | ICD-10-CM | POA: Diagnosis not present

## 2022-11-16 LAB — CMP (CANCER CENTER ONLY)
ALT: 9 U/L (ref 0–44)
AST: 12 U/L — ABNORMAL LOW (ref 15–41)
Albumin: 3.9 g/dL (ref 3.5–5.0)
Alkaline Phosphatase: 60 U/L (ref 38–126)
Anion gap: 8 (ref 5–15)
BUN: 12 mg/dL (ref 8–23)
CO2: 28 mmol/L (ref 22–32)
Calcium: 9.2 mg/dL (ref 8.9–10.3)
Chloride: 103 mmol/L (ref 98–111)
Creatinine: 0.59 mg/dL (ref 0.44–1.00)
GFR, Estimated: >60 mL/min (ref >60)
Glucose, Bld: 98 mg/dL (ref 70–99)
Potassium: 3.7 mmol/L (ref 3.5–5.1)
Sodium: 139 mmol/L (ref 135–145)
Total Bilirubin: 0.5 mg/dL (ref 0.3–1.2)
Total Protein: 6.9 g/dL (ref 6.5–8.1)

## 2022-11-16 LAB — CBC WITH DIFFERENTIAL (CANCER CENTER ONLY)
Abs Immature Granulocytes: 0.01 10*3/uL (ref 0.00–0.07)
Basophils Absolute: 0 10*3/uL (ref 0.0–0.1)
Basophils Relative: 1 %
Eosinophils Absolute: 0.1 10*3/uL (ref 0.0–0.5)
Eosinophils Relative: 2 %
HCT: 31.4 % — ABNORMAL LOW (ref 36.0–46.0)
Hemoglobin: 10.6 g/dL — ABNORMAL LOW (ref 12.0–15.0)
Immature Granulocytes: 0 %
Lymphocytes Relative: 33 %
Lymphs Abs: 1.5 10*3/uL (ref 0.7–4.0)
MCH: 34.2 pg — ABNORMAL HIGH (ref 26.0–34.0)
MCHC: 33.8 g/dL (ref 30.0–36.0)
MCV: 101.3 fL — ABNORMAL HIGH (ref 80.0–100.0)
Monocytes Absolute: 0.5 10*3/uL (ref 0.1–1.0)
Monocytes Relative: 10 %
Neutro Abs: 2.3 10*3/uL (ref 1.7–7.7)
Neutrophils Relative %: 54 %
Platelet Count: 171 10*3/uL (ref 150–400)
RBC: 3.1 MIL/uL — ABNORMAL LOW (ref 3.87–5.11)
RDW: 15.1 % (ref 11.5–15.5)
WBC Count: 4.3 10*3/uL (ref 4.0–10.5)
nRBC: 0 % (ref 0.0–0.2)

## 2022-11-16 LAB — TSH: TSH: 1.381 u[IU]/mL (ref 0.350–4.500)

## 2022-11-16 MED ORDER — GADOBUTROL 1 MMOL/ML IV SOLN
10.0000 mL | Freq: Once | INTRAVENOUS | Status: AC | PRN
  Administered 2022-11-16: 10 mL via INTRAVENOUS

## 2022-11-16 NOTE — Telephone Encounter (Signed)
Late entry from 11/15/22 - entered 11/16/22: Letter in patient's chart composed 11/10/22, printed and signed by Dr. Chryl Heck 11/15/22. Faxed to [facility], attn Vista Mink '@[phone]. Fax confirmation received Contents of letter- "Ms. Quinby is a patient at the [facility]. Her last day of work was on August 30, 2022. She will need to be out of work through the end of January 2024. The work absence is secondary to her medical treatment." Patient given letter while in office for appt 11/16/22

## 2022-11-17 ENCOUNTER — Encounter: Payer: Self-pay | Admitting: Hematology and Oncology

## 2022-11-17 MED ORDER — PREDNISONE 2.5 MG PO TABS: 2.5000 mg | ORAL_TABLET | Freq: Every day | ORAL | 0 refills | Status: DC

## 2022-11-18 ENCOUNTER — Other Ambulatory Visit: Payer: Self-pay | Admitting: General Surgery

## 2022-11-18 ENCOUNTER — Ambulatory Visit: Payer: BC Managed Care – PPO

## 2022-11-18 DIAGNOSIS — C50212 Malignant neoplasm of upper-inner quadrant of left female breast: Secondary | ICD-10-CM

## 2022-11-18 LAB — T4: T4, Total: 6.9 ug/dL (ref 4.5–12.0)

## 2022-11-21 ENCOUNTER — Other Ambulatory Visit: Payer: Self-pay | Admitting: Hematology and Oncology

## 2022-11-22 ENCOUNTER — Encounter: Payer: Self-pay | Admitting: Hematology and Oncology

## 2022-11-22 NOTE — Telephone Encounter (Signed)
Spoke with pt. She states she meant to request refill from PCP, but she is having difficulty getting in touch with them to refill. Advised pt if she is needing Xanax consistently BID, she will need to receive one month supply from PCP. She verbalized thanks and understanding and knows to call PCP once again to check on them.

## 2022-11-23 ENCOUNTER — Other Ambulatory Visit: Payer: Self-pay | Admitting: General Surgery

## 2022-11-23 ENCOUNTER — Encounter: Payer: Self-pay | Admitting: *Deleted

## 2022-11-23 DIAGNOSIS — Z171 Estrogen receptor negative status [ER-]: Secondary | ICD-10-CM

## 2022-12-05 HISTORY — PX: BREAST LUMPECTOMY: SHX2

## 2022-12-09 NOTE — Progress Notes (Signed)
Surgical Instructions    Your procedure is scheduled on Thursday, 12/15/22.  Report to [facility] Main Entrance "A" at 7:30 A.M., then check in with the Admitting office.  Call this number if you have problems the morning of surgery:  [phone]   If you have any questions prior to your surgery date call [phone]: Open Monday-Friday 8am-4pm If you experience any cold or flu symptoms such as cough, fever, chills, shortness of breath, etc. between now and your scheduled surgery, please notify us at the above number     Remember:  Do not eat after midnight the night before your surgery  You may drink clear liquids until 6:30am the morning of your surgery.   Clear liquids allowed are: Water, Non-Citrus Juices (without pulp), Carbonated Beverages, Clear Tea, Black Coffee ONLY (NO MILK, CREAM OR POWDERED CREAMER of any kind), and Gatorade    Take these medicines the morning of surgery with A SIP OF WATER:  DULoxetine (CYMBALTA)  predniSONE (DELTASONE)   IF NEEDED: acetaminophen (TYLENOL)  ALPRAZolam (XANAX)  meclizine (ANTIVERT)   As of today, STOP taking any Aspirin (unless otherwise instructed by your surgeon) Aleve, Naproxen, Ibuprofen, Motrin, Advil, Goody's, BC's, all herbal medications, fish oil, and all vitamins.  WHAT DO I DO ABOUT MY DIABETES MEDICATION?   Do not take oral diabetes medicines (pills) the morning of surgery.  Do not take JARDIANCE 72 hours prior to surgery. Your last dose will be 10/12/23.  Do not take [place] 1 week prior to surgery. Your last dose will be 10/08/23.     THE MORNING OF SURGERY, do not take metFORMIN (GLUCOPHAGE).  The day of surgery, do not take other diabetes injectables, including Byetta (exenatide), Bydureon (exenatide ER), Victoza (liraglutide), or Trulicity (dulaglutide).  If your CBG is greater than 220 mg/dL, you may take  of your sliding scale (correction) dose of insulin.   HOW TO MANAGE YOUR DIABETES BEFORE AND AFTER  SURGERY  Why is it important to control my blood sugar before and after surgery? Improving blood sugar levels before and after surgery helps healing and can limit problems. A way of improving blood sugar control is eating a healthy diet by:  Eating less sugar and carbohydrates  Increasing activity/exercise  Talking with your doctor about reaching your blood sugar goals High blood sugars (greater than 180 mg/dL) can raise your risk of infections and slow your recovery, so you will need to focus on controlling your diabetes during the weeks before surgery. Make sure that the doctor who takes care of your diabetes knows about your planned surgery including the date and location.  How do I manage my blood sugar before surgery? Check your blood sugar at least 4 times a day, starting 2 days before surgery, to make sure that the level is not too high or low.  Check your blood sugar the morning of your surgery when you wake up and every 2 hours until you get to the Short Stay unit.  If your blood sugar is less than 70 mg/dL, you will need to treat for low blood sugar: Do not take insulin. Treat a low blood sugar (less than 70 mg/dL) with  cup of clear juice (cranberry or apple), 4 glucose tablets, OR glucose gel. Recheck blood sugar in 15 minutes after treatment (to make sure it is greater than 70 mg/dL). If your blood sugar is not greater than 70 mg/dL on recheck, call [phone] for further instructions. Report your blood sugar to the short stay  nurse when you get to Short Stay.  If you are admitted to the hospital after surgery: Your blood sugar will be checked by the staff and you will probably be given insulin after surgery (instead of oral diabetes medicines) to make sure you have good blood sugar levels. The goal for blood sugar control after surgery is 80-180 mg/dL.          Do not wear jewelry or makeup. Do not wear lotions, powders, perfumes or deodorant. Do not shave 48 hours prior  to surgery.  Do not bring valuables to the hospital. Do not wear nail polish, gel polish, artificial nails, or any other type of covering on natural nails (fingers and toes) If you have artificial nails or gel coating that need to be removed by a nail salon, please have this removed prior to surgery. Artificial nails or gel coating may interfere with anesthesia's ability to adequately monitor your vital signs.  [facility] is not responsible for any belongings or valuables.    Do NOT Smoke (Tobacco/Vaping)  24 hours prior to your procedure  If you use a CPAP at night, you may bring your mask for your overnight stay.   Contacts, glasses, hearing aids, dentures or partials may not be worn into surgery, please bring cases for these belongings   For patients admitted to the hospital, discharge time will be determined by your treatment team.   Patients discharged the day of surgery will not be allowed to drive home, and someone needs to stay with them for 24 hours.   SURGICAL WAITING ROOM VISITATION Patients having surgery or a procedure may have no more than 2 support people in the waiting area - these visitors may rotate.   Children under the age of 32 must have an adult with them who is not the patient. If the patient needs to stay at the hospital during part of their recovery, the visitor guidelines for inpatient rooms apply. Pre-op nurse will coordinate an appropriate time for 1 support person to accompany patient in pre-op.  This support person may not rotate.   Please refer to [email or web address] for the visitor guidelines for Inpatients (after your surgery is over and you are in a regular room).    Special instructions:    Oral Hygiene is also important to reduce your risk of infection.  Remember - BRUSH YOUR TEETH THE MORNING OF SURGERY WITH YOUR REGULAR TOOTHPASTE   [place]- Preparing For Surgery  Before surgery, you can  play an important role. Because skin is not sterile, your skin needs to be as free of germs as possible. You can reduce the number of germs on your skin by washing with CHG (chlorahexidine gluconate) Soap before surgery.  CHG is an antiseptic cleaner which kills germs and bonds with the skin to continue killing germs even after washing.     Please do not use if you have an allergy to CHG or antibacterial soaps. If your skin becomes reddened/irritated stop using the CHG.  Do not shave (including legs and underarms) for at least 48 hours prior to first CHG shower. It is OK to shave your face.  Please follow these instructions carefully.     Shower the NIGHT BEFORE SURGERY and the MORNING OF SURGERY with CHG Soap.   If you chose to wash your hair, wash your hair first as usual with your normal shampoo. After you shampoo, rinse your hair and body thoroughly to remove the shampoo.  Then [employer] and  genitals (private parts) with your normal soap and rinse thoroughly to remove soap.  After that Use CHG Soap as you would any other liquid soap. You can apply CHG directly to the skin and wash gently with a scrungie or a clean washcloth.   Apply the CHG Soap to your body ONLY FROM THE NECK DOWN.  Do not use on open wounds or open sores. Avoid contact with your eyes, ears, mouth and genitals (private parts). Wash Face and genitals (private parts)  with your normal soap.   Wash thoroughly, paying special attention to the area where your surgery will be performed.  Thoroughly rinse your body with warm water from the neck down.  DO NOT shower/wash with your normal soap after using and rinsing off the CHG Soap.  Pat yourself dry with a CLEAN TOWEL.  Wear CLEAN PAJAMAS to bed the night before surgery  Place CLEAN SHEETS on your bed the night before your surgery  DO NOT SLEEP WITH PETS.   Day of Surgery: Take a shower with CHG soap. Wear Clean/Comfortable clothing the morning of surgery Do not  apply any deodorants/lotions.   Remember to brush your teeth WITH YOUR REGULAR TOOTHPASTE.    If you received a COVID test during your pre-op visit, it is requested that you wear a mask when out in public, stay away from anyone that may not be feeling well, and notify your surgeon if you develop symptoms. If you have been in contact with anyone that has tested positive in the last 10 days, please notify your surgeon.    Please read over the following fact sheets that you were given.

## 2022-12-12 ENCOUNTER — Encounter (HOSPITAL_COMMUNITY): Payer: Self-pay

## 2022-12-12 ENCOUNTER — Encounter (HOSPITAL_COMMUNITY)
Admission: RE | Admit: 2022-12-12 | Discharge: 2022-12-12 | Disposition: A | Discharge status: Home / Self Care | Payer: BC Managed Care – PPO | Source: Ambulatory Visit | Attending: General Surgery | Admitting: General Surgery

## 2022-12-12 ENCOUNTER — Ambulatory Visit: Payer: BC Managed Care – PPO | Admitting: Psychologist

## 2022-12-12 ENCOUNTER — Other Ambulatory Visit: Payer: Self-pay

## 2022-12-12 VITALS — BP 94/66 | HR 80 | Temp 98.1°F | Resp 17 | Ht 69.0 in | Wt 191.4 lb

## 2022-12-12 DIAGNOSIS — Z87891 Personal history of nicotine dependence: Secondary | ICD-10-CM | POA: Insufficient documentation

## 2022-12-12 DIAGNOSIS — Z7984 Long term (current) use of oral hypoglycemic drugs: Secondary | ICD-10-CM | POA: Insufficient documentation

## 2022-12-12 DIAGNOSIS — Z853 Personal history of malignant neoplasm of breast: Secondary | ICD-10-CM | POA: Insufficient documentation

## 2022-12-12 DIAGNOSIS — Z7985 Long-term (current) use of injectable non-insulin antidiabetic drugs: Secondary | ICD-10-CM | POA: Insufficient documentation

## 2022-12-12 DIAGNOSIS — M1990 Unspecified osteoarthritis, unspecified site: Secondary | ICD-10-CM | POA: Diagnosis not present

## 2022-12-12 DIAGNOSIS — G709 Myoneural disorder, unspecified: Secondary | ICD-10-CM | POA: Diagnosis not present

## 2022-12-12 DIAGNOSIS — Z794 Long term (current) use of insulin: Secondary | ICD-10-CM | POA: Insufficient documentation

## 2022-12-12 DIAGNOSIS — Z01812 Encounter for preprocedural laboratory examination: Secondary | ICD-10-CM | POA: Insufficient documentation

## 2022-12-12 DIAGNOSIS — F419 Anxiety disorder, unspecified: Secondary | ICD-10-CM | POA: Diagnosis not present

## 2022-12-12 DIAGNOSIS — Z9221 Personal history of antineoplastic chemotherapy: Secondary | ICD-10-CM | POA: Insufficient documentation

## 2022-12-12 DIAGNOSIS — Z8249 Family history of ischemic heart disease and other diseases of the circulatory system: Secondary | ICD-10-CM | POA: Diagnosis not present

## 2022-12-12 DIAGNOSIS — C50912 Malignant neoplasm of unspecified site of left female breast: Secondary | ICD-10-CM | POA: Insufficient documentation

## 2022-12-12 DIAGNOSIS — Z79899 Other long term (current) drug therapy: Secondary | ICD-10-CM | POA: Diagnosis not present

## 2022-12-12 DIAGNOSIS — C50212 Malignant neoplasm of upper-inner quadrant of left female breast: Secondary | ICD-10-CM | POA: Diagnosis present

## 2022-12-12 DIAGNOSIS — Z833 Family history of diabetes mellitus: Secondary | ICD-10-CM | POA: Diagnosis not present

## 2022-12-12 DIAGNOSIS — Z171 Estrogen receptor negative status [ER-]: Secondary | ICD-10-CM | POA: Diagnosis not present

## 2022-12-12 DIAGNOSIS — I10 Essential (primary) hypertension: Secondary | ICD-10-CM | POA: Diagnosis not present

## 2022-12-12 DIAGNOSIS — E119 Type 2 diabetes mellitus without complications: Secondary | ICD-10-CM

## 2022-12-12 HISTORY — DX: Myoneural disorder, unspecified: G70.9

## 2022-12-12 LAB — HEMOGLOBIN A1C
Hgb A1c MFr Bld: 6.1 % — ABNORMAL HIGH (ref 4.8–5.6)
Mean Plasma Glucose: 128 mg/dL

## 2022-12-12 LAB — GLUCOSE, CAPILLARY: Glucose-Capillary: 166 mg/dL — ABNORMAL HIGH (ref 70–99)

## 2022-12-12 NOTE — H&P (Signed)
PROVIDER:  Georgianne Fick, MD Patient Care Team: Maggie Font, MD as PCP - General (Family Medicine) Georgianne Fick, MD as Consulting Provider (Surgical Oncology) Blair Promise, MD (Radiation Oncology) Iruku, Flo Shanks, MD (Hematology and Oncology)   MRN: [id number] DOB: 1961/04/17 DATE OF ENCOUNTER: 11/18/2022    Chief Complaint: Left Breast Cancer       History of Present Illness: Sydney Lee is a 62 y.o. female who is seen today for breast cancer follow up.   Initial history:    Pt presented with new diagnosis of left breast cancer 05/2022.  She had a palpable mass "pop up out of nowhere" around 1-2 months ago.  She was able to get dx mammo and ultrasound which showed a 2.9 cm dominant mass at 10 o'clock with a 0.8 cm satellite mass also at 10 o'clock.  There was also an abnormal appearing lymph node.     The dominant mass and the node were biopsied.  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       Family cancer history none Menarche 79 Menopause 25 Parity 2   Work: Air cabin crew on school bus   Interval history:    Pt had an MRI which showed the tumor to be larger with the satellite nodule incorporated at around 4.3 cm.  Another node was visible and this was also biopsied and was negative.  She has been receiving chemotherapy.  She couldn't fell the tumor after 1 cycle of chemo.  She had some drop in her EF and adriamycin was dropped.  Genetic testing was negative.     Review of Systems: A complete review of systems was obtained from the patient.  I have reviewed this information and discussed as appropriate with the patient.  See HPI as well for other ROS.   Review of Systems  Skin:  Positive for itching.  Psychiatric/Behavioral:  The patient is nervous/anxious.   All other systems reviewed and are negative.       Medical History: Past Medical History      Past Medical History:  Diagnosis  Date   Anxiety     Diabetes mellitus without complication (CMS-HCC)     Hyperlipidemia     Hypertension             Patient Active Problem List  Diagnosis   Malignant neoplasm of upper-inner quadrant of left breast in female, estrogen receptor negative    Itching   Nervousness   Anxiousness      Past Surgical History       Past Surgical History:  Procedure Laterality Date   INSERTION PORT-A-CATH WITH ULTRASOUND GUIDANCE   06/01/2022    Dr. Barry Dienes        Allergies       Allergies  Allergen Reactions   Codeine Other (See Comments)      "Makes me sick to my stomach"              Current Outpatient Medications on File Prior to Visit  Medication Sig Dispense Refill   ALPRAZolam (XANAX) 0.25 MG tablet Take 0.25 mg by mouth 2 (two) times daily       atorvastatin (LIPITOR) 10 MG tablet Take 10 mg by mouth every Monday, Wednesday, and Friday       JARDIANCE 10 mg tablet Take 10 mg by mouth once daily       losartan (COZAAR) 100 MG tablet Take 1 tablet  by mouth 2 (two) times daily       meclizine (ANTIVERT) 25 mg tablet Take 25 mg by mouth once a week       metFORMIN (GLUCOPHAGE) 1000 MG tablet Take 1,000 mg by mouth 2 (two) times daily       semaglutide (OZEMPIC) 1 mg/dose (4 mg/3 mL) pen injector Inject 1 mg subcutaneously once a week        No current facility-administered medications on file prior to visit.      Family History       Family History  Problem Relation Age of Onset   High blood pressure (Hypertension) Mother     Coronary Artery Disease (Blocked arteries around heart) Brother     Hyperlipidemia (Elevated cholesterol) Brother     Diabetes Maternal Aunt          Social History        Tobacco Use  Smoking Status Former   Packs/day: 3   Types: Cigarettes   Quit date: 08/2022   Years since quitting: 0.2  Smokeless Tobacco Never      Social History  Social History         Socioeconomic History   Marital status: Single  Tobacco Use    Smoking status: Former      Packs/day: 3      Types: Cigarettes      Quit date: 08/2022      Years since quitting: 0.2   Smokeless tobacco: Never  Vaping Use   Vaping Use: Unknown  Substance and Sexual Activity   Alcohol use: Never   Drug use: Never   Sexual activity: Defer        Objective:         Vitals:    11/18/22 0846  BP: 112/68  Pulse: 110  Temp: 36.4 C (97.5 F)  SpO2: 96%  Weight: 84.6 kg (186 lb 6.4 oz)  Height: 175.9 cm (5' 9.25")  PainSc: 0-No pain    Body mass index is 27.33 kg/m.   Head:   Normocephalic and atraumatic.  Eyes:    Conjunctivae are normal. Pupils are equal, round, and reactive to light. No scleral icterus.  Neck:   Normal range of motion. Neck supple. No tracheal deviation present. No thyromegaly present.  Resp:   No respiratory distress, normal effort. Breast:Cannot palpate tumor anymore.  Breasts very ptotic.  No LAD. No skin changes/dimpling.  No nipple retraction or nipple discharge.   Abd:      Abdomen is soft, non distended and non tender. No masses are palpable.  There is no rebound and no guarding.  Neurological: Alert and oriented to person, place, and time. Coordination normal.  Skin:    Skin is warm and dry. No rash noted. No diaphoretic. No erythema. No pallor. Skin discoloration on arms at site of prior itching/reaction to cream.   Psychiatric: Normal mood and affect. Normal behavior. Judgment and thought content normal.      Labs, Imaging and Diagnostic Testing:   Labs 11/16/2022 HCT 31.4 CMET essentially normal     Assessment and Plan:     Diagnoses and all orders for this visit:   Malignant neoplasm of upper-inner quadrant of left breast in female, estrogen receptor negative      Will plan seed localized lumpectomy and sentinel node biopsy.  I will plan dual tracers now that we are s/p chemotherapy.  Also, I have placed orders for 2 seeds in case BCG would like a bracketed resection.  Her daughter was present  via facetime.     The surgical procedure was described to the patient.  I discussed the incision type and location and that we will may need radiology involved for seed marker and sentinel node injection.       We discussed the risks bleeding, infection, damage to other structures, need for further procedures/surgeries.  We discussed the risk of seroma.  The patient was advised if the breast has cancer, we may need to go back to surgery for additional tissue to obtain negative margins or for a lymph node biopsy. The patient was advised that these are the most common complications, but that others can occur as well. I discussed the risk of alteration in breast contour or size.  I discussed risk of chronic pain.  There are rare instances of heart/lung issues post op as well as blood clots.      They were advised against taking aspirin or other anti-inflammatory agents/blood thinners the week before surgery.     The risks and benefits of the procedure were described to the patient and she wishes to proceed.

## 2022-12-12 NOTE — Progress Notes (Signed)
PCP - [facility] Cardiologist - Glori Bickers Oncology: Chryl Heck  PPM/ICD -  Device Orders -  Rep Notified -   Chest x-ray - n/a EKG - 10/17/22 Stress Test - denies ECHO - 10/11/22 Cardiac Cath - denies  Sleep Study - denies   Fasting Blood Sugar - 110s Checks Blood Sugar twice a day  Last dose of GLP1 agonist-  12/07/22 GLP1 instructions: do not take 7 days prior to surgery  As of today, STOP taking any Aspirin (unless otherwise instructed by your surgeon) Aleve, Naproxen, Ibuprofen, Motrin, Advil, Goody's, BC's, all herbal medications, fish oil, and all vitamins.   ERAS Protcol -yes PRE-SURGERY Ensure or G2- not ordered  COVID TEST- not needed   Anesthesia review: yes, cardiac history  Patient denies shortness of breath, fever, cough and chest pain at PAT appointment   All instructions explained to the patient, with a verbal understanding of the material. Patient agrees to go over the instructions while at home for a better understanding. Patient also instructed to self quarantine after being tested for COVID-19. The opportunity to ask questions was provided.

## 2022-12-13 ENCOUNTER — Ambulatory Visit
Admission: RE | Admit: 2022-12-13 | Discharge: 2022-12-13 | Disposition: A | Discharge status: Home / Self Care | Payer: BC Managed Care – PPO | Source: Ambulatory Visit | Attending: General Surgery | Admitting: General Surgery

## 2022-12-13 ENCOUNTER — Encounter (HOSPITAL_COMMUNITY): Payer: Self-pay

## 2022-12-13 DIAGNOSIS — C50212 Malignant neoplasm of upper-inner quadrant of left female breast: Secondary | ICD-10-CM

## 2022-12-13 HISTORY — PX: BREAST BIOPSY: SHX20

## 2022-12-13 NOTE — Anesthesia Preprocedure Evaluation (Signed)
Anesthesia Evaluation  Patient identified by MRN, date of birth, ID band Patient awake    Reviewed: Allergy & Precautions, NPO status , Patient's Chart, lab work & pertinent test results, reviewed documented beta blocker date and time   Airway Mallampati: III  TM Distance: >3 FB Neck ROM: Full    Dental  (+) Dental Advisory Given, Upper Dentures, Partial Lower   Pulmonary Patient abstained from smoking., former smoker   Pulmonary exam normal breath sounds clear to auscultation       Cardiovascular hypertension, Pt. on home beta blockers and Pt. on medications Normal cardiovascular exam Rhythm:Regular Rate:Normal  MRI Cardiac 11/16/22: IMPRESSION: 1. Mild biventricular dysfunction (LV EF 49%, RV EF 48%). 2. No myocardial LGE noted. 3. Diffuse elevation in extracellular volume percentage at 37%. This suggests diffusely increased myocardial fibrosis. Lack of LGE and ECV< 40% make cardiac amyloidosis unlikely.   Echo 10/11/22: IMPRESSIONS  1. LV function is newly reduced compared to 06/02/22.  2. Left ventricular ejection fraction, by estimation, is 30 to 35%. The  left ventricle has moderately decreased function. The left ventricle  demonstrates global hypokinesis. Left ventricular diastolic parameters are  consistent with Grade I diastolic  dysfunction (impaired relaxation). The average left ventricular global  longitudinal strain is -12.4 %. The global longitudinal strain is  abnormal.  3. Right ventricular systolic function is normal. The right ventricular  size is normal. There is normal pulmonary artery systolic pressure.  4. The mitral valve is normal in structure. No evidence of mitral valve  regurgitation. No evidence of mitral stenosis.  5. The aortic valve is tricuspid. Aortic valve regurgitation is not  visualized. No aortic stenosis is present.  6. The inferior vena cava is normal in size with greater than 50%   respiratory variability, suggesting right atrial pressure of 3 mmHg.  - Comparison echo 06/02/22: LVEF 55-60%, no RWMA, normal RVSF, trivial MR.     Neuro/Psych  PSYCHIATRIC DISORDERS Anxiety Depression     Neuromuscular disease    GI/Hepatic negative GI ROS, Neg liver ROS,,,  Endo/Other  diabetes, Type 2, Oral Hypoglycemic Agents    Renal/GU negative Renal ROS     Musculoskeletal  (+) Arthritis ,    Abdominal   Peds  Hematology negative hematology ROS (+)   Anesthesia Other Findings Day of surgery medications reviewed with the patient.  Left breast cancer   Reproductive/Obstetrics                             Anesthesia Physical Anesthesia Plan  ASA: 3  Anesthesia Plan: General   Post-op Pain Management: Regional block* and Tylenol PO (pre-op)*   Induction: Intravenous  PONV Risk Score and Plan: 3 and Midazolam, Dexamethasone and Ondansetron  Airway Management Planned: LMA  Additional Equipment:   Intra-op Plan:   Post-operative Plan: Extubation in OR  Informed Consent: I have reviewed the patients History and Physical, chart, labs and discussed the procedure including the risks, benefits and alternatives for the proposed anesthesia with the patient or authorized representative who has indicated his/her understanding and acceptance.     Dental advisory given  Plan Discussed with: CRNA  Anesthesia Plan Comments: (PAT note written 12/13/2022 by Myra Gianotti, PA-C. 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   Per Dr. Haroldine Laws, "Pre-op risk stratification - pending L breast surgery and LN sampling  - with EF 45% and preserved functional capacity she is at low risk for peri-op CV complications -  Can proceed to OR from our standpoint". )        Anesthesia Quick Evaluation

## 2022-12-13 NOTE — Progress Notes (Signed)
Anesthesia Chart Review:  Case: [id number] Date/Time: 12/15/22 0915   Procedure: LEFT BREAST LUMPECTOMY WITH RADIOACTIVE SEED AND SENTINEL LYMPH NODE BIOPSY (Left: Breast)   Anesthesia type: General   Pre-op diagnosis: LEFT BREAST CANCER   Location: [facility] OR ROOM 02 / [place] OR   Surgeons: Stark Klein, MD       DISCUSSION: Patient is a 62 year old female scheduled for the above procedure.  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  She was last evaluated by cardiologist Dr. Haroldine Laws on 11/15/22 for follow-up in the Cardio-Oncology Program. She was diagnosed with left breast cancer in June 2023 and is s/p "treated with Pembrolizumab (200) D1 + Carboplatin (5) D1 + Paclitaxel (80) D1,8,15 q21d X 4 cycles  followed by Pembrolizumab (200) D1 + AC D1 q21d x 4 cycles (received 2 cycles of AC including adriamycin)" from ~ July - November 2023. After cycle 5 D1, she was admitted (09/15/22-09/21/22) with Streptococcal pneumonia with sepsis (in setting of chemotherapy neutropenia), anemia requiring transfusion, severe fatigue. S/p Ceftrizxone. Discharged home once clinically improved.   Pre-chemo 05/2022 echocardiogram showed normal LVEF 55-60%. Follow-up echo 10/2022 showed LVEF 30-35% (Dr. Haroldine Laws reviewed and felt EF was closer to 40-45%.) He doubted ischemic cardiomyopathy as no coronary calcifications on 09/17/22 chest CT. He did not feel echo pattern was consistent with Takotsubo cardiomyopathy. He added, "With normal hstrop active pembrolizumab-related myocarditis very unlikely. Suspect likely adriamycin toxicity." Dr. Chryl Heck stopped A/C, so no further Adriamycin. Cardiac MRI was done on 11/16/22 and showed mild biventricular dysfunction (LV EF 49%, RV EF 48%),  no myocardial LGE noted, diffuse elevation in extracellular volume percentage at 37% suggesting diffusely increased myocardial fibrosis. Cardiac amyloidosis felt unlikely given lack of LGE and ECV< 40%.  In regards to Preoperative Risk stratification, Dr. Haroldine Laws wrote, "Pre-op risk stratification - pending L breast surgery and LN sampling  - with EF 45% and preserved functional capacity she is at low risk for peri-op CV complications - Can proceed to OR from our standpoint".  For diabetes, she is on Jardiance, metformin, and Ozempic. At PAT RN visit, advised to hold Jardiance for 72 hours and Ozempic for 1 week prior to surgery. Hold metformin on the morning of surgery. Her last A1c was 6.1%on 12/12/22.  She had CBC and CMP on 11/16/22 at [facility]. Anesthesia team to evaluate on the day of surgery. RSL placed on 12/13/22.    VS: BP 94/66   Pulse 80   Temp 36.7 C   Resp 17   Ht '5\' 9"'$  (1.753 m)   Wt 86.8 kg   SpO2 99%   BMI 28.26 kg/m    PROVIDERS: [name], MD is PCP  Benay Pike, MD is HEM-ONC Glori Bickers, MD is HF cardiologist   LABS: Most recent lab results in [facility] include: Lab Results  Component Value Date   WBC 4.3 11/16/2022   HGB 10.6 (L) 11/16/2022   HCT 31.4 (L) 11/16/2022   PLT 171 11/16/2022   GLUCOSE 98 11/16/2022   ALT 9 11/16/2022   AST 12 (L) 11/16/2022   NA 139 11/16/2022   K 3.7 11/16/2022   CL 103 11/16/2022   CREATININE 0.59 11/16/2022   BUN 12 11/16/2022   CO2 28 11/16/2022   TSH 1.381 11/16/2022   INR 1.2 09/16/2022   HGBA1C 6.1 (H) 12/12/2022  IMAGES: CT Chest 09/17/22: IMPRESSION: 1. Findings in the left lower lobe likely reflect a round pneumonia. Strictly speaking, however, the possibility of neoplasm is not entirely excluded, and follow-up noncontrast chest CT is recommended in 3 months to ensure complete regression of these findings. - Aortic Atherosclerosis (ICD10-I70.0).    EKG: 10/17/22:  Sinus tachycardia at 103  bpm Low voltage QRS Cannot rule out Anterior infarct , age undetermined Abnormal ECG When compared with ECG of 15-Sep-2022 21:41, Nonspecific T wave abnormality now evident in Lateral leads Confirmed by Dixie Dials [phone]) on 10/19/2022 10:28:58 PM   CV: MRI Cardiac 11/16/22: IMPRESSION: 1.  Mild biventricular dysfunction (LV EF 49%, RV EF 48%). 2.  No myocardial LGE noted. 3. Diffuse elevation in extracellular volume percentage at 37%. This suggests diffusely increased myocardial fibrosis. Lack of LGE and ECV< 40% make cardiac amyloidosis unlikely.   Echo 10/11/22: IMPRESSIONS   1. LV function is newly reduced compared to 06/02/22.   2. Left ventricular ejection fraction, by estimation, is 30 to 35%. The  left ventricle has moderately decreased function. The left ventricle  demonstrates global hypokinesis. Left ventricular diastolic parameters are  consistent with Grade I diastolic  dysfunction (impaired relaxation). The average left ventricular global  longitudinal strain is -12.4 %. The global longitudinal strain is  abnormal.   3. Right ventricular systolic function is normal. The right ventricular  size is normal. There is normal pulmonary artery systolic pressure.   4. The mitral valve is normal in structure. No evidence of mitral valve  regurgitation. No evidence of mitral stenosis.   5. The aortic valve is tricuspid. Aortic valve regurgitation is not  visualized. No aortic stenosis is present.   6. The inferior vena cava is normal in size with greater than 50%  respiratory variability, suggesting right atrial pressure of 3 mmHg.  - Comparison echo 06/02/22: LVEF 55-60%, no RWMA, normal RVSF, trivial MR.   Past Medical History:  Diagnosis Date   Anxiety    Arthritis    Breast cancer ([place])    Cardiomyopathy ([place]) 10/2022   likley chemotherapy/adriamycin induced cardiomyopathy   Depression    Diabetes mellitus without complication ([place])    type 2   Hypertension     Neuromuscular disorder ([place])    neuropathy hands/feets    Past Surgical History:  Procedure Laterality Date   PORTACATH PLACEMENT N/A 06/01/2022   Procedure: INSERTION PORT-A-CATH WITH ULTRASOUND GUIDANCE;  Surgeon: Stark Klein, MD;  Location: [facility] ORS;  Service: General;  Laterality: N/A;    MEDICATIONS:  acetaminophen (TYLENOL) 500 MG tablet   ALPRAZolam (XANAX) 0.25 MG tablet   atorvastatin (LIPITOR) 10 MG tablet   DULoxetine (CYMBALTA) 30 MG capsule   JARDIANCE 10 MG TABS tablet   lidocaine-prilocaine (EMLA) cream   losartan (COZAAR) 100 MG tablet   meclizine (ANTIVERT) 25 MG tablet   metFORMIN (GLUCOPHAGE) 1000 MG tablet   metoprolol succinate (TOPROL XL) 50 MG 24 hr tablet   ondansetron (ZOFRAN) 8 MG tablet   OZEMPIC, 1 MG/DOSE, 4 MG/3ML SOPN   predniSONE (DELTASONE) 2.5 MG tablet   prochlorperazine (COMPAZINE) 10 MG tablet   No current facility-administered medications for this encounter.    Myra Gianotti, PA-C Surgical Short Stay/Anesthesiology [facility] Phone [phone] [facility] Phone [phone] 12/13/2022 2:25 PM

## 2022-12-15 ENCOUNTER — Other Ambulatory Visit: Payer: Self-pay

## 2022-12-15 ENCOUNTER — Ambulatory Visit
Admission: RE | Admit: 2022-12-15 | Discharge: 2022-12-15 | Disposition: A | Discharge status: Home / Self Care | Payer: BC Managed Care – PPO | Source: Ambulatory Visit | Attending: General Surgery | Admitting: General Surgery

## 2022-12-15 ENCOUNTER — Encounter (HOSPITAL_COMMUNITY)
Admission: RE | Admit: 2022-12-15 | Discharge: 2022-12-15 | Disposition: A | Discharge status: Home / Self Care | Payer: BC Managed Care – PPO | Source: Ambulatory Visit | Attending: General Surgery | Admitting: General Surgery

## 2022-12-15 ENCOUNTER — Encounter (HOSPITAL_COMMUNITY)
Admission: RE | Disposition: A | Discharge status: Home / Self Care | Payer: Self-pay | Source: Home / Self Care | Attending: General Surgery

## 2022-12-15 ENCOUNTER — Encounter (HOSPITAL_COMMUNITY): Payer: Self-pay | Admitting: General Surgery

## 2022-12-15 ENCOUNTER — Ambulatory Visit (HOSPITAL_COMMUNITY)
Admission: RE | Admit: 2022-12-15 | Discharge: 2022-12-15 | Disposition: A | Discharge status: Home / Self Care | Payer: BC Managed Care – PPO | Attending: General Surgery | Admitting: General Surgery

## 2022-12-15 ENCOUNTER — Ambulatory Visit (HOSPITAL_COMMUNITY): Payer: BC Managed Care – PPO | Admitting: Vascular Surgery

## 2022-12-15 ENCOUNTER — Ambulatory Visit (HOSPITAL_COMMUNITY): Payer: BC Managed Care – PPO | Admitting: Anesthesiology

## 2022-12-15 DIAGNOSIS — Z171 Estrogen receptor negative status [ER-]: Secondary | ICD-10-CM | POA: Insufficient documentation

## 2022-12-15 DIAGNOSIS — Z79899 Other long term (current) drug therapy: Secondary | ICD-10-CM | POA: Insufficient documentation

## 2022-12-15 DIAGNOSIS — C50212 Malignant neoplasm of upper-inner quadrant of left female breast: Secondary | ICD-10-CM | POA: Diagnosis not present

## 2022-12-15 DIAGNOSIS — Z833 Family history of diabetes mellitus: Secondary | ICD-10-CM | POA: Insufficient documentation

## 2022-12-15 DIAGNOSIS — Z9221 Personal history of antineoplastic chemotherapy: Secondary | ICD-10-CM | POA: Insufficient documentation

## 2022-12-15 DIAGNOSIS — M1990 Unspecified osteoarthritis, unspecified site: Secondary | ICD-10-CM | POA: Insufficient documentation

## 2022-12-15 DIAGNOSIS — E119 Type 2 diabetes mellitus without complications: Secondary | ICD-10-CM

## 2022-12-15 DIAGNOSIS — I10 Essential (primary) hypertension: Secondary | ICD-10-CM | POA: Insufficient documentation

## 2022-12-15 DIAGNOSIS — Z8249 Family history of ischemic heart disease and other diseases of the circulatory system: Secondary | ICD-10-CM | POA: Insufficient documentation

## 2022-12-15 DIAGNOSIS — G709 Myoneural disorder, unspecified: Secondary | ICD-10-CM | POA: Insufficient documentation

## 2022-12-15 DIAGNOSIS — F419 Anxiety disorder, unspecified: Secondary | ICD-10-CM | POA: Insufficient documentation

## 2022-12-15 DIAGNOSIS — Z87891 Personal history of nicotine dependence: Secondary | ICD-10-CM | POA: Insufficient documentation

## 2022-12-15 DIAGNOSIS — Z7984 Long term (current) use of oral hypoglycemic drugs: Secondary | ICD-10-CM | POA: Insufficient documentation

## 2022-12-15 HISTORY — PX: BREAST LUMPECTOMY WITH RADIOACTIVE SEED AND SENTINEL LYMPH NODE BIOPSY: SHX6550

## 2022-12-15 LAB — GLUCOSE, CAPILLARY
Glucose-Capillary: 102 mg/dL — ABNORMAL HIGH (ref 70–99)
Glucose-Capillary: 138 mg/dL — ABNORMAL HIGH (ref 70–99)
Glucose-Capillary: 164 mg/dL — ABNORMAL HIGH (ref 70–99)

## 2022-12-15 SURGERY — BREAST LUMPECTOMY WITH RADIOACTIVE SEED AND SENTINEL LYMPH NODE BIOPSY
Anesthesia: General | Site: Breast | Laterality: Left

## 2022-12-15 MED ORDER — EPHEDRINE SULFATE-NACL 50-0.9 MG/10ML-% IV SOSY
PREFILLED_SYRINGE | INTRAVENOUS | Status: DC | PRN
  Administered 2022-12-15 (×3): 10 mg via INTRAVENOUS
  Administered 2022-12-15: 5 mg via INTRAVENOUS

## 2022-12-15 MED ORDER — PHENYLEPHRINE 80 MCG/ML (10ML) SYRINGE FOR IV PUSH (FOR BLOOD PRESSURE SUPPORT)
PREFILLED_SYRINGE | INTRAVENOUS | Status: DC | PRN
  Administered 2022-12-15 (×6): 80 ug via INTRAVENOUS

## 2022-12-15 MED ORDER — LIDOCAINE-EPINEPHRINE 1 %-1:100000 IJ SOLN
INTRAMUSCULAR | Status: DC | PRN
  Administered 2022-12-15: 40 mL via SURGICAL_CAVITY

## 2022-12-15 MED ORDER — FENTANYL CITRATE (PF) 100 MCG/2ML IJ SOLN
INTRAMUSCULAR | Status: AC
  Administered 2022-12-15: 50 ug
  Filled 2022-12-15: qty 2

## 2022-12-15 MED ORDER — FENTANYL CITRATE PF 50 MCG/ML IJ SOSY: 50.0000 ug | PREFILLED_SYRINGE | Freq: Once | INTRAMUSCULAR | Status: DC

## 2022-12-15 MED ORDER — FENTANYL CITRATE (PF) 250 MCG/5ML IJ SOLN
INTRAMUSCULAR | Status: AC
  Filled 2022-12-15: qty 5

## 2022-12-15 MED ORDER — TECHNETIUM TC 99M TILMANOCEPT KIT
1.0000 | PACK | Freq: Once | INTRAVENOUS | Status: AC | PRN
  Administered 2022-12-15: 1 via INTRADERMAL

## 2022-12-15 MED ORDER — PROPOFOL 10 MG/ML IV BOLUS
INTRAVENOUS | Status: DC | PRN
  Administered 2022-12-15: 150 mg via INTRAVENOUS

## 2022-12-15 MED ORDER — INSULIN ASPART 100 UNIT/ML IJ SOLN: 0.0000 [IU] | INTRAMUSCULAR | Status: DC | PRN

## 2022-12-15 MED ORDER — CEFAZOLIN SODIUM-DEXTROSE 2-4 GM/100ML-% IV SOLN
2.0000 g | INTRAVENOUS | Status: AC
  Administered 2022-12-15: 2 g via INTRAVENOUS
  Filled 2022-12-15: qty 100

## 2022-12-15 MED ORDER — MIDAZOLAM HCL 2 MG/2ML IJ SOLN
INTRAMUSCULAR | Status: AC
  Administered 2022-12-15: 1 mg via INTRAVENOUS
  Filled 2022-12-15: qty 2

## 2022-12-15 MED ORDER — CHLORHEXIDINE GLUCONATE CLOTH 2 % EX PADS: 6.0000 | MEDICATED_PAD | Freq: Once | CUTANEOUS | Status: DC

## 2022-12-15 MED ORDER — OXYCODONE HCL 5 MG PO TABS: 5.0000 mg | ORAL_TABLET | Freq: Four times a day (QID) | ORAL | 0 refills | Status: DC | PRN

## 2022-12-15 MED ORDER — ONDANSETRON HCL 4 MG/2ML IJ SOLN
INTRAMUSCULAR | Status: DC | PRN
  Administered 2022-12-15: 4 mg via INTRAVENOUS

## 2022-12-15 MED ORDER — ORAL CARE MOUTH RINSE: 15.0000 mL | Freq: Once | OROMUCOSAL | Status: AC

## 2022-12-15 MED ORDER — DEXAMETHASONE SODIUM PHOSPHATE 10 MG/ML IJ SOLN
INTRAMUSCULAR | Status: DC | PRN
  Administered 2022-12-15: 10 mg

## 2022-12-15 MED ORDER — ONDANSETRON HCL 4 MG/2ML IJ SOLN
INTRAMUSCULAR | Status: AC
  Filled 2022-12-15: qty 2

## 2022-12-15 MED ORDER — LIDOCAINE 2% (20 MG/ML) 5 ML SYRINGE
INTRAMUSCULAR | Status: AC
  Filled 2022-12-15: qty 15

## 2022-12-15 MED ORDER — EPHEDRINE 5 MG/ML INJ
INTRAVENOUS | Status: AC
  Filled 2022-12-15: qty 5

## 2022-12-15 MED ORDER — CHLORHEXIDINE GLUCONATE 0.12 % MT SOLN
15.0000 mL | Freq: Once | OROMUCOSAL | Status: AC
  Administered 2022-12-15: 15 mL via OROMUCOSAL
  Filled 2022-12-15: qty 15

## 2022-12-15 MED ORDER — KETOROLAC TROMETHAMINE 30 MG/ML IJ SOLN
INTRAMUSCULAR | Status: AC
  Filled 2022-12-15: qty 1

## 2022-12-15 MED ORDER — BUPIVACAINE HCL (PF) 0.25 % IJ SOLN
INTRAMUSCULAR | Status: AC
  Filled 2022-12-15: qty 30

## 2022-12-15 MED ORDER — LIDOCAINE 2% (20 MG/ML) 5 ML SYRINGE
INTRAMUSCULAR | Status: DC | PRN
  Administered 2022-12-15: 80 mg via INTRAVENOUS

## 2022-12-15 MED ORDER — MAGTRACE LYMPHATIC TRACER
INTRAMUSCULAR | Status: DC | PRN
  Administered 2022-12-15: 2 mL via INTRAMUSCULAR

## 2022-12-15 MED ORDER — 0.9 % SODIUM CHLORIDE (POUR BTL) OPTIME
TOPICAL | Status: DC | PRN
  Administered 2022-12-15: 1000 mL

## 2022-12-15 MED ORDER — MIDAZOLAM HCL 2 MG/2ML IJ SOLN: 1.0000 mg | Freq: Once | INTRAMUSCULAR | Status: AC

## 2022-12-15 MED ORDER — FENTANYL CITRATE (PF) 250 MCG/5ML IJ SOLN
INTRAMUSCULAR | Status: DC | PRN
  Administered 2022-12-15 (×2): 25 ug via INTRAVENOUS

## 2022-12-15 MED ORDER — LIDOCAINE-EPINEPHRINE 1 %-1:100000 IJ SOLN
INTRAMUSCULAR | Status: AC
  Filled 2022-12-15: qty 1

## 2022-12-15 MED ORDER — PROPOFOL 10 MG/ML IV BOLUS
INTRAVENOUS | Status: AC
  Filled 2022-12-15: qty 20

## 2022-12-15 MED ORDER — PHENYLEPHRINE 80 MCG/ML (10ML) SYRINGE FOR IV PUSH (FOR BLOOD PRESSURE SUPPORT)
PREFILLED_SYRINGE | INTRAVENOUS | Status: AC
  Filled 2022-12-15: qty 10

## 2022-12-15 MED ORDER — LACTATED RINGERS IV SOLN: INTRAVENOUS | Status: DC

## 2022-12-15 MED ORDER — ACETAMINOPHEN 500 MG PO TABS
1000.0000 mg | ORAL_TABLET | ORAL | Status: AC
  Administered 2022-12-15: 1000 mg via ORAL
  Filled 2022-12-15: qty 2

## 2022-12-15 MED ORDER — BUPIVACAINE-EPINEPHRINE (PF) 0.5% -1:200000 IJ SOLN
INTRAMUSCULAR | Status: DC | PRN
  Administered 2022-12-15: 30 mL via PERINEURAL

## 2022-12-15 MED ORDER — MIDAZOLAM HCL 2 MG/2ML IJ SOLN: 2.0000 mg | Freq: Once | INTRAMUSCULAR | Status: DC

## 2022-12-15 SURGICAL SUPPLY — 46 items
BAG COUNTER SPONGE SURGICOUNT (BAG) ×1 IMPLANT
BINDER BREAST LRG (GAUZE/BANDAGES/DRESSINGS) IMPLANT
BINDER BREAST XLRG (GAUZE/BANDAGES/DRESSINGS) IMPLANT
BNDG COHESIVE 4X5 TAN STRL (GAUZE/BANDAGES/DRESSINGS) ×1 IMPLANT
CANISTER SUCT 3000ML PPV (MISCELLANEOUS) ×1 IMPLANT
CHLORAPREP W/TINT 26 (MISCELLANEOUS) ×1 IMPLANT
CLIP TI LARGE 6 (CLIP) ×1 IMPLANT
CLIP TI MEDIUM 24 (CLIP) ×1 IMPLANT
CNTNR URN SCR LID CUP LEK RST (MISCELLANEOUS) IMPLANT
CONT SPEC 4OZ STRL OR WHT (MISCELLANEOUS)
COVER PROBE W GEL 5X96 (DRAPES) ×2 IMPLANT
COVER SURGICAL LIGHT HANDLE (MISCELLANEOUS) ×1 IMPLANT
DERMABOND ADVANCED .7 DNX12 (GAUZE/BANDAGES/DRESSINGS) ×1 IMPLANT
DEVICE DUBIN SPECIMEN MAMMOGRA (MISCELLANEOUS) IMPLANT
DRAPE CHEST BREAST 15X10 FENES (DRAPES) ×1 IMPLANT
DRAPE SURG 17X23 STRL (DRAPES) IMPLANT
ELECT COATED BLADE 2.86 ST (ELECTRODE) ×1 IMPLANT
ELECT REM PT RETURN 9FT ADLT (ELECTROSURGICAL) ×1
ELECTRODE REM PT RTRN 9FT ADLT (ELECTROSURGICAL) ×1 IMPLANT
GAUZE PAD ABD 8X10 STRL (GAUZE/BANDAGES/DRESSINGS) IMPLANT
GAUZE SPONGE 4X4 12PLY STRL (GAUZE/BANDAGES/DRESSINGS) IMPLANT
GLOVE BIO SURGEON STRL SZ 6 (GLOVE) ×1 IMPLANT
GLOVE INDICATOR 6.5 STRL GRN (GLOVE) ×1 IMPLANT
GOWN STRL REUS W/ TWL LRG LVL3 (GOWN DISPOSABLE) ×1 IMPLANT
GOWN STRL REUS W/TWL 2XL LVL3 (GOWN DISPOSABLE) ×1 IMPLANT
GOWN STRL REUS W/TWL LRG LVL3 (GOWN DISPOSABLE) ×1
KIT BASIN OR (CUSTOM PROCEDURE TRAY) ×1 IMPLANT
KIT MARKER MARGIN INK (KITS) ×1 IMPLANT
LIGHT WAVEGUIDE WIDE FLAT (MISCELLANEOUS) IMPLANT
NDL 18GX1X1/2 (RX/OR ONLY) (NEEDLE) IMPLANT
NDL FILTER BLUNT 18X1 1/2 (NEEDLE) IMPLANT
NDL HYPO 25GX1X1/2 BEV (NEEDLE) ×1 IMPLANT
NEEDLE 18GX1X1/2 (RX/OR ONLY) (NEEDLE) IMPLANT
NEEDLE FILTER BLUNT 18X1 1/2 (NEEDLE) IMPLANT
NEEDLE HYPO 25GX1X1/2 BEV (NEEDLE) ×1 IMPLANT
NS IRRIG 1000ML POUR BTL (IV SOLUTION) ×1 IMPLANT
PACK GENERAL/GYN (CUSTOM PROCEDURE TRAY) ×1 IMPLANT
PACK UNIVERSAL I (CUSTOM PROCEDURE TRAY) ×1 IMPLANT
STOCKINETTE IMPERVIOUS 9X36 MD (GAUZE/BANDAGES/DRESSINGS) ×1 IMPLANT
STRIP CLOSURE SKIN 1/2X4 (GAUZE/BANDAGES/DRESSINGS) IMPLANT
SUT MNCRL AB 4-0 PS2 18 (SUTURE) ×1 IMPLANT
SUT VIC AB 3-0 SH 8-18 (SUTURE) ×1 IMPLANT
SYR CONTROL 10ML LL (SYRINGE) ×1 IMPLANT
TOWEL GREEN STERILE (TOWEL DISPOSABLE) ×1 IMPLANT
TOWEL GREEN STERILE FF (TOWEL DISPOSABLE) ×1 IMPLANT
TRACER MAGTRACE VIAL (MISCELLANEOUS) IMPLANT

## 2022-12-15 NOTE — Anesthesia Postprocedure Evaluation (Signed)
Anesthesia Post Note  Patient: Sydney Lee  Procedure(s) Performed: LEFT BREAST LUMPECTOMY WITH RADIOACTIVE SEED AND SENTINEL LYMPH NODE BIOPSY (Left: Breast)     Patient location during evaluation: PACU Anesthesia Type: General Level of consciousness: awake and alert Pain management: pain level controlled Vital Signs Assessment: post-procedure vital signs reviewed and stable Respiratory status: spontaneous breathing, nonlabored ventilation, respiratory function stable and patient connected to nasal cannula oxygen Cardiovascular status: blood pressure returned to baseline and stable Postop Assessment: no apparent nausea or vomiting Anesthetic complications: no  No notable events documented.  Last Vitals:  Vitals:   12/15/22 1207 12/15/22 1222  BP: 100/63 (!) 100/59  Pulse: 79 79  Resp: 20 15  Temp:  36.6 C  SpO2: 100% 100%    Last Pain:  Vitals:   12/15/22 1222  TempSrc:   PainSc: 0-No pain                 Santa Lighter

## 2022-12-15 NOTE — Op Note (Signed)
Left Breast Radioactive seed localized lumpectomy and sentinel lymph node mapping and biopsy  Indications: This patient presents with history of left breast cancer, cT2N0 grade 3 invasive ductal carcinoma, upper inner quadrant, ER-/PR-/Her2-, s/p neoadjuvant chemotherapy, "near complete imaging resolution" on follow up MRI  Pre-operative Diagnosis: left breast cancer  Post-operative Diagnosis: Same  Surgeon: Stark Klein   Assistant: Celene Squibb, RNFA  Anesthesia: General endotracheal anesthesia  ASA Class: 3  Procedure Details  The patient was seen in the Holding Room. The risks, benefits, complications, treatment options, and expected outcomes were discussed with the patient. The possibilities of bleeding, infection, the need for additional procedures, failure to diagnose a condition, and creating a complication requiring transfusion or operation were discussed with the patient. The patient concurred with the proposed plan, giving informed consent.  The site of surgery properly noted/marked. The patient was taken to Operating Room # 2, identified, and the procedure verified as Left Breast Seed localized Lumpectomy with sentinel lymph node biopsy. The left arm, breast, and chest were prepped and draped in standard fashion. A Time Out was held and the above information confirmed. The MagTrace was injected into the subareolar position.  The lumpectomy was performed by creating a curvilinear trasnverse incision near the previously placed radioactive seed in the upper inner quadrant.  Dissection was carried down to around the point of maximum signal intensity. The cautery was used to perform the dissection.  Hemostasis was achieved with cautery.    The specimen was inked with the margin marker paint kit.    Specimen radiography confirmed inclusion of the mammographic lesion, the clip, and the seed.  3D specimen evaluation showed that the clip was close anteriorly, medially, and superiorly.   Anteriorly there was only skin.  However additional margins were taken medially and superiorly.  The edges of the cavity were marked with large clips.  The background signal in the breast was zero. The wound was irrigated and reinspected for hemostasis.   The breast tissue was rearranged and mastopexy sutures were placed to minimize the defect.   The skin was then closed with 3-0 vicryl in layers and 4-0 monocryl subcuticular suture.    Using a neoprobe, left axillary sentinel nodes were identified transcutaneously.  An oblique incision was created below the axillary hairline.  Dissection was carried through the clavipectoral fascia.  The MagTrace did not appear to travel well to the axilla, but the Tc99 did appropriately map.  Five deep level two axillary sentinel nodes were removed.  Counts per second were 2900 at the highest and 50 at the lowest.    The background count was 10 cps.  The wound was irrigated.  Hemostasis was achieved with cautery.  The axillary incision was closed with a 3-0 vicryl deep dermal interrupted sutures and a 4-0 monocryl subcuticular closure.    Sterile dressings were applied. At the end of the operation, all sponge, instrument, and needle counts were correct.  Findings: grossly clear surgical margins and no adenopathy, anterior margin is skin, posterior margin is pectoralis.  Estimated Blood Loss:  min         Specimens: left breast tissue with seed, additional medial margin, additional superior margin, and five left axillary sentinel lymph nodes.             Complications:  None; patient tolerated the procedure well.         Disposition: PACU - hemodynamically stable.         Condition: stable

## 2022-12-15 NOTE — Transfer of Care (Addendum)
Immediate Anesthesia Transfer of Care Note  Patient: Sydney Lee  Procedure(s) Performed: LEFT BREAST LUMPECTOMY WITH RADIOACTIVE SEED AND SENTINEL LYMPH NODE BIOPSY (Left: Breast)  Patient Location: PACU  Anesthesia Type:General  Level of Consciousness: awake and alert   Airway & Oxygen Therapy: Patient Spontanous Breathing  Post-op Assessment: Report given to RN and Post -op Vital signs reviewed and stable  Post vital signs: Reviewed and stable  Last Vitals:  Vitals Value Taken Time  BP 164/68 12/15/22 1152  Temp    Pulse 80 12/15/22 1154  Resp 14 12/15/22 1155  SpO2 100 % 12/15/22 1154  Vitals shown include unvalidated device data.  Last Pain:  Vitals:   12/15/22 0813  TempSrc:   PainSc: 0-No pain         Complications: No notable events documented.

## 2022-12-15 NOTE — Anesthesia Procedure Notes (Signed)
Procedure Name: LMA Insertion Date/Time: 12/15/2022 10:03 AM  Performed by: Reeves Dam, CRNAPre-anesthesia Checklist: Patient identified, Patient being monitored, Timeout performed, Emergency Drugs available and Suction available Patient Re-evaluated:Patient Re-evaluated prior to induction Oxygen Delivery Method: Circle system utilized Preoxygenation: Pre-oxygenation with 100% oxygen Induction Type: IV induction Ventilation: Mask ventilation without difficulty LMA: LMA inserted LMA Size: 4.0 Tube type: Oral Number of attempts: 1 Placement Confirmation: positive ETCO2 and breath sounds checked- equal and bilateral Tube secured with: Tape Dental Injury: Teeth and Oropharynx as per pre-operative assessment

## 2022-12-15 NOTE — Interval H&P Note (Signed)
History and Physical Interval Note:  12/15/2022 9:39 AM  Sydney Lee  has presented today for surgery, with the diagnosis of LEFT BREAST CANCER.  The various methods of treatment have been discussed with the patient and family. After consideration of risks, benefits and other options for treatment, the patient has consented to  Procedure(s): LEFT BREAST LUMPECTOMY WITH RADIOACTIVE SEED AND SENTINEL LYMPH NODE BIOPSY (Left) as a surgical intervention.  The patient's history has been reviewed, patient examined, no change in status, stable for surgery.  I have reviewed the patient's chart and labs.  Questions were answered to the patient's satisfaction.     Stark Klein

## 2022-12-15 NOTE — Anesthesia Procedure Notes (Signed)
Anesthesia Regional Block: Pectoralis block   Pre-Anesthetic Checklist: , timeout performed,  Correct Patient, Correct Site, Correct Laterality,  Correct Procedure, Correct Position, site marked,  Risks and benefits discussed,  Surgical consent,  Pre-op evaluation,  At surgeon's request and post-op pain management  Laterality: Left  Prep: chloraprep       Needles:  Injection technique: Single-shot  Needle Type: Echogenic Needle     Needle Length: 9cm  Needle Gauge: 21     Additional Needles:   Procedures:,,,, ultrasound used (permanent image in chart),,    Narrative:  Start time: 12/15/2022 8:47 AM End time: 12/15/2022 8:55 AM Injection made incrementally with aspirations every 5 mL.  Performed by: Personally  Anesthesiologist: Santa Lighter, MD  Additional Notes: No pain on injection. No increased resistance to injection. Injection made in 5cc increments.  Good needle visualization.  Patient tolerated procedure well.

## 2022-12-16 ENCOUNTER — Encounter (HOSPITAL_COMMUNITY): Payer: Self-pay | Admitting: General Surgery

## 2022-12-16 ENCOUNTER — Ambulatory Visit: Payer: BC Managed Care – PPO | Admitting: Hematology and Oncology

## 2022-12-16 LAB — SURGICAL PATHOLOGY

## 2022-12-22 ENCOUNTER — Encounter (HOSPITAL_COMMUNITY): Payer: Self-pay

## 2022-12-23 ENCOUNTER — Encounter: Payer: Self-pay | Admitting: *Deleted

## 2022-12-29 ENCOUNTER — Encounter: Payer: Self-pay | Admitting: *Deleted

## 2022-12-29 ENCOUNTER — Inpatient Hospital Stay: Payer: BC Managed Care – PPO | Attending: Physician Assistant | Admitting: Hematology and Oncology

## 2022-12-29 ENCOUNTER — Other Ambulatory Visit: Payer: Self-pay

## 2022-12-29 VITALS — BP 120/66 | HR 71 | Temp 97.0°F | Resp 16 | Wt 195.4 lb

## 2022-12-29 DIAGNOSIS — C50212 Malignant neoplasm of upper-inner quadrant of left female breast: Secondary | ICD-10-CM

## 2022-12-29 DIAGNOSIS — G620 Drug-induced polyneuropathy: Secondary | ICD-10-CM | POA: Diagnosis not present

## 2022-12-29 DIAGNOSIS — R931 Abnormal findings on diagnostic imaging of heart and coronary circulation: Secondary | ICD-10-CM

## 2022-12-29 DIAGNOSIS — T451X5A Adverse effect of antineoplastic and immunosuppressive drugs, initial encounter: Secondary | ICD-10-CM

## 2022-12-29 DIAGNOSIS — G629 Polyneuropathy, unspecified: Secondary | ICD-10-CM | POA: Diagnosis not present

## 2022-12-29 DIAGNOSIS — Z171 Estrogen receptor negative status [ER-]: Secondary | ICD-10-CM

## 2022-12-29 NOTE — Progress Notes (Signed)
[facility] Follow up:    Sydney Lee, [address] [place] [address]   DIAGNOSIS:  Cancer Staging  Malignant neoplasm of upper-inner quadrant of left breast in female, estrogen receptor negative ([place]) Staging form: Breast, AJCC 8th Edition - Clinical: Stage IIB (cT2, cN0, cM0, G3, ER-, PR-, HER2-) - Signed by Benay Pike, MD on 05/25/2022 Stage prefix: Initial diagnosis Histologic grading system: 3 grade system   SUMMARY OF ONCOLOGIC HISTORY: Oncology History  Malignant neoplasm of upper-inner quadrant of left breast in female, estrogen receptor negative ([facility])  04/27/2022 Mammogram   Diagnostic mammogram showed indeterminate solid mass in the 10:00 location of the left breast.  Small satellite nodule adjacent to the index mass.  Borderline lymph node has cortical thickening of 2.9 mm.  Other lymph nodes have normal morphology.   05/16/2022 Pathology Results   Left breast needle core biopsy showed invasive poorly differentiated adenocarcinoma, grade 3 with tumor necrosis, lymph node biopsy benign reactive, negative for carcinoma.  Prognostics from the tumor showed ER 0%, negative, PR 0%, negative, Ki-67 of 80% and HER2 negative   05/23/2022 Initial Diagnosis   Malignant neoplasm of upper-inner quadrant of left breast in female, estrogen receptor negative ([facility])   05/25/2022 Cancer Staging   Staging form: Breast, AJCC 8th Edition - Clinical: Stage IIB (cT2, cN0, cM0, G3, ER-, PR-, HER2-) - Signed by Benay Pike, MD on 05/25/2022 Stage prefix: Initial diagnosis Histologic grading system: 3 grade system   05/25/2022 Genetic Testing   [facility] CancerNext-Expanded Panel was Negative. Report date was 06/02/2022.  The CancerNext-Expanded gene panel offered by [facility] and includes sequencing, rearrangement, and RNA analysis for the following 77 genes: AIP, ALK, APC, ATM, AXIN2, BAP1, BARD1, BLM, BMPR1A, BRCA1, BRCA2, BRIP1, CDC73, CDH1, CDK4,  CDKN1B, CDKN2A, CHEK2, CTNNA1, DICER1, FANCC, FH, FLCN, GALNT12, KIF1B, LZTR1, MAX, MEN1, MET, MLH1, MSH2, MSH3, MSH6, MUTYH, NBN, NF1, NF2, NTHL1, PALB2, PHOX2B, PMS2, POT1, PRKAR1A, PTCH1, PTEN, RAD51C, RAD51D, RB1, RECQL, RET, SDHA, SDHAF2, SDHB, SDHC, SDHD, SMAD4, SMARCA4, SMARCB1, SMARCE1, STK11, SUFU, TMEM127, TP53, TSC1, TSC2, VHL and XRCC2 (sequencing and deletion/duplication); EGFR, EGLN1, HOXB13, KIT, MITF, PDGFRA, POLD1, and POLE (sequencing only); EPCAM and GREM1 (deletion/duplication only).    06/02/2022 Initial Biopsy   Additional left axillary node biopsy   06/06/2022 - 08/02/2022 Chemotherapy   Patient is on Treatment Plan : BREAST Pembrolizumab (200) D1 + Carboplatin (5) D1 + Paclitaxel (80) D1,8,15 q21d X 4 cycles / Pembrolizumab (200) D1 + AC D1 q21d x 4 cycles     06/06/2022 - 10/07/2022 Chemotherapy   Patient is on Treatment Plan : BREAST Pembrolizumab (200) D1 + Carboplatin (5) D1 + Paclitaxel (80) D1,8,15 q21d X 4 cycles / Pembrolizumab (200) D1 + AC D1 q21d x 4 cycles      CURRENT THERAPY: Taxol/Carbo/Keytruda  INTERVAL HISTORY:  Sydney Lee 62 y.o. female returns for follow-up and evaluation.  She had left breast lumpectomy which showed no residual carcinoma in situ or invasive carcinoma identified ypT0 all margins negative for carcinoma in situ or invasive carcinoma.  5 sentinel lymph nodes removed all negative for carcinoma Since surgery, she has been healing well however her left axilla has now developed a swelling which is uncomfortable.  She denies any fevers or chills.  She has good days and bad days from neuropathy standpoint.  She is still taking Cymbalta as instructed.  Her appetite is back her energy levels are better.  She is hoping  to go back to work.  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  She denies any chest pain or chest pressure or shortness of breath today.  She has a follow-up appointment with Dr. Barry Dienes.  Rest of the pertinent 10  point ROS reviewed and negative  Patient Active Problem List   Diagnosis Date Noted   Fatigue due to treatment 10/13/2022   Malnutrition of moderate degree 09/17/2022   Severe sepsis with acute organ dysfunction ([place]) 09/15/2022   HTN (hypertension) 09/15/2022   Diabetes mellitus, type II ([place]) 09/15/2022   Anxiety 09/15/2022   Depression 09/15/2022   Port-A-Cath in place 06/06/2022   Infusion reaction 06/06/2022   Genetic testing 06/03/2022   Family history of breast cancer 05/25/2022   Family history of ovarian cancer 05/25/2022   Malignant neoplasm of upper-inner quadrant of left breast in female, estrogen receptor negative ([place]) 05/23/2022    is allergic to [facility] [aprepitant], codeine, and latex.  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   Depression    Diabetes mellitus without complication (H[facility])    type 2   Hypertension    Neuromuscular disorder ([facility])    neuropathy hands/feets    SURGICAL HISTORY: Past Surgical History:  Procedure Laterality Date   BREAST BIOPSY  12/13/2022   MM LT RADIOACTIVE SEED LOC MAMMO GUIDE 12/13/2022 [facility] MAMMOGRAPHY   BREAST LUMPECTOMY WITH RADIOACTIVE SEED AND SENTINEL LYMPH NODE BIOPSY Left 12/15/2022   Procedure: LEFT BREAST LUMPECTOMY WITH RADIOACTIVE SEED AND SENTINEL LYMPH NODE BIOPSY;  Surgeon: Stark Klein, MD;  Location: [facility];  Service: General;  Laterality: Left;   PORTACATH PLACEMENT N/A 06/01/2022   Procedure: INSERTION PORT-A-CATH WITH ULTRASOUND GUIDANCE;  Surgeon: Stark Klein, MD;  Location: [facility];  Service: General;  Laterality: N/A;    SOCIAL HISTORY: Social History   Socioeconomic History   Marital status: Married    Spouse name: Not on file   Number of children: Not on file   Years of education: Not on file   Highest education level: Not on file  Occupational  History   Not on file  Tobacco Use   Smoking status: Former    Packs/day: 1.00    Years: 40.00    Total pack years: 40.00    Types: Cigarettes    Quit date: 06/05/2022    Years since quitting: 0.5   Smokeless tobacco: Never  Vaping Use   Vaping Use: Never used  Substance and Sexual Activity   Alcohol use: No   Drug use: No   Sexual activity: Not on file  Other Topics Concern   Not on file  Social History Narrative   Not on file   Social Determinants of Health   Financial Resource Strain: High Risk (06/14/2022)   Overall Financial Resource Strain (CARDIA)    Difficulty of Paying Living Expenses: Hard  Food Insecurity: No Food Insecurity (09/15/2022)   Hunger Vital Sign    Worried About Running Out of Food in the Last Year: Never true    Ran Out of Food in the Last Year: Never true  Transportation Needs: No Transportation Needs (09/15/2022)   PRAPARE - Hydrologist (Medical): No    Lack of Transportation (Non-Medical): No  Physical Activity: Not on file  Stress: Not on file  Social Connections:  Not on file  Intimate Partner Violence: Not At Risk (09/16/2022)   Humiliation, Afraid, Rape, and Kick questionnaire    Fear of Current or Ex-Partner: No    Emotionally Abused: No    Physically Abused: No    Sexually Abused: No    FAMILY HISTORY: Family History  Problem Relation Age of Onset   Leukemia Mother 74   Throat cancer Maternal Uncle 85   Prostate cancer Maternal Uncle    Breast cancer Cousin 49       maternal first cousin   Ovarian cancer Cousin        maternal first cousin   Breast cancer Cousin 45 - 35       paternal first cousin   Breast cancer Cousin 10 - 25       paternal first cousin    Review of Systems  Constitutional:  Negative for appetite change, chills, fatigue, fever and unexpected weight change.  HENT:   Negative for hearing loss, lump/mass and trouble swallowing.   Eyes:  Negative for eye problems and icterus.   Respiratory:  Negative for chest tightness, cough and shortness of breath.   Cardiovascular:  Negative for chest pain, leg swelling and palpitations.  Gastrointestinal:  Negative for abdominal distention, abdominal pain, constipation, diarrhea, nausea and vomiting.  Endocrine: Negative for hot flashes.  Genitourinary:  Negative for difficulty urinating.   Musculoskeletal:  Negative for arthralgias.  Skin:  Negative for itching and rash.  Neurological:  Negative for dizziness, extremity weakness, headaches and numbness.  Hematological:  Negative for adenopathy. Does not bruise/bleed easily.  Psychiatric/Behavioral:  Positive for depression. The patient is not nervous/anxious.       PHYSICAL EXAMINATION  ECOG PERFORMANCE STATUS: 1 - Symptomatic but completely ambulatory  Vitals:   12/29/22 0858  BP: 120/66  Pulse: 71  Resp: 16  Temp: (!) 97 F (36.1 C)  SpO2: 98%    Physical Exam Constitutional:      General: She is not in acute distress.    Appearance: Normal appearance. She is not toxic-appearing.  HENT:     Head: Normocephalic and atraumatic.  Eyes:     General: No scleral icterus. Chest:     Comments:  Left breast incision appears to be healing well.  There is a postop seroma in the left axilla with no evidence of infection. Abdominal:     General: There is no distension.     Tenderness: There is no abdominal tenderness.  Musculoskeletal:        General: No swelling.     Cervical back: Neck supple.  Lymphadenopathy:     Cervical: No cervical adenopathy.  Skin:    General: Skin is warm and dry.     Findings: No rash.  Neurological:     Mental Status: She is alert.  Psychiatric:        Mood and Affect: Mood normal.        Behavior: Behavior normal.    Breast exam deferred today LABORATORY DATA:  CBC    Component Value Date/Time   WBC 4.3 11/16/2022 1416   WBC 11.3 (H) 09/21/2022 0310   RBC 3.10 (L) 11/16/2022 1416   HGB 10.6 (L) 11/16/2022 1416    HCT 31.4 (L) 11/16/2022 1416   PLT 171 11/16/2022 1416   MCV 101.3 (H) 11/16/2022 1416   MCH 34.2 (H) 11/16/2022 1416   MCHC 33.8 11/16/2022 1416   RDW 15.1 11/16/2022 1416   LYMPHSABS 1.5 11/16/2022 1416   MONOABS  0.5 11/16/2022 1416   EOSABS 0.1 11/16/2022 1416   BASOSABS 0.0 11/16/2022 1416    CMP     Component Value Date/Time   NA 139 11/16/2022 1416   K 3.7 11/16/2022 1416   CL 103 11/16/2022 1416   CO2 28 11/16/2022 1416   GLUCOSE 98 11/16/2022 1416   BUN 12 11/16/2022 1416   CREATININE 0.59 11/16/2022 1416   CALCIUM 9.2 11/16/2022 1416   PROT 6.9 11/16/2022 1416   ALBUMIN 3.9 11/16/2022 1416   AST 12 (L) 11/16/2022 1416   ALT 9 11/16/2022 1416   ALKPHOS 60 11/16/2022 1416   BILITOT 0.5 11/16/2022 1416   GFRNONAA >60 11/16/2022 1416   GFRAA >60 12/22/2018 2150    ASSESSMENT and THERAPY PLAN:  Sydney Lee is a 62 y.o. 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   #Stage IIb triple negative breast cancer  --Currently receiving neoadjuvant chemotherapy with carbo/taxol and keytruda x 4 cycles followed by adriamycin, cytoxan and keytruda x 4 cycles.  -- She received 4 cycles of CarboTaxol Keytruda followed by 1 cycle of AC Keytruda. --She had cardiotoxicity from Adriamycin, hence chemo discontinued and she was taken to surgery -- She had complete pathologic response and is here to review final pathology and adjuvant recommendations. -- We have discussed the role of adjuvant immunotherapy.  I have sent a message to Dr. Haroldine Laws to make sure it is okay to restart immunotherapy.  I have also sent an in basket to Dr. Sondra Come to discuss about adjuvant radiation.  With regards to the postop seroma, she has a follow-up appointment with Dr. Barry Dienes who may recommend incision and drainage versus surveillance.  I have encouraged her to wear postop bras.  #Peripheral neuropathy in hands and feet reported, this has improved since her last visit  here.  She will continue on Cymbalta.  #Loss of appetite.  This has also significantly improved  Total time spent: 30 minutes including history, physical exam, review of records, counseling and coordination of care   All questions were answered. The patient knows to call the clinic with any problems, questions or concerns. We can certainly see the patient much sooner if necessary. Benay Pike MD

## 2022-12-30 ENCOUNTER — Telehealth: Payer: Self-pay | Admitting: *Deleted

## 2022-12-30 NOTE — Telephone Encounter (Signed)
Order ID: [id number]       Authorized  Approval Valid Through: 12/30/2022 - 01/28/2023  [facility] Josem Kaufmann

## 2023-01-02 ENCOUNTER — Encounter: Payer: Self-pay | Admitting: Physical Therapy

## 2023-01-02 DIAGNOSIS — N6489 Other specified disorders of breast: Secondary | ICD-10-CM | POA: Insufficient documentation

## 2023-01-03 ENCOUNTER — Encounter: Payer: Self-pay | Admitting: *Deleted

## 2023-01-04 NOTE — Therapy (Deleted)
OUTPATIENT PHYSICAL THERAPY BREAST CANCER POST OP FOLLOW UP   Patient Name: Sydney Lee MRN: [id number] DOB:18-Feb-1961, 62 y.o., female Today's Date: 01/04/2023  END OF SESSION:   Past Medical History:  Diagnosis Date   Anxiety    Arthritis    Breast cancer ([place])    Cardiomyopathy ([place]) 10/2022   likley chemotherapy/adriamycin induced cardiomyopathy   Depression    Diabetes mellitus without complication ([place])    type 2   Hypertension    Neuromuscular disorder ([facility])    neuropathy hands/feets   Past Surgical History:  Procedure Laterality Date   BREAST BIOPSY  12/13/2022   MM LT RADIOACTIVE SEED LOC MAMMO GUIDE 12/13/2022 [facility] MAMMO[facility]APHY   BREAST LUMPECTOMY WITH RADIOACTIVE SEED AND SENTINEL LYMPH NODE BIOPSY Left 12/15/2022   Procedure: LEFT BREAST LUMPECTOMY WITH RADIOACTIVE SEED AND SENTINEL LYMPH NODE BIOPSY;  Surgeon: Stark Klein, MD;  Location: [facility];  Service: General;  Laterality: Left;   PORTACATH PLACEMENT N/A 06/01/2022   Procedure: INSERTION PORT-A-CATH WITH ULTRASOUND GUIDANCE;  Surgeon: Stark Klein, MD;  [facility];  Service: General;  Laterality: N/A;   Patient Active Problem List   Diagnosis Date Noted   Fatigue due to treatment 10/13/2022   Malnutrition of moderate degree 09/17/2022   Severe sepsis with acute organ dysfunction ([place]) 09/15/2022   HTN (hypertension) 09/15/2022   Diabetes mellitus, type II ([place]) 09/15/2022   Anxiety 09/15/2022   Depression 09/15/2022   Port-A-Cath in place 06/06/2022   Infusion reaction 06/06/2022   Genetic testing 06/03/2022   Family history of breast cancer 05/25/2022   Family history of ovarian cancer 05/25/2022   Malignant neoplasm of upper-inner quadrant of left breast in female, estrogen receptor negative ([place]) 05/23/2022    REFERRING PROVIDER: Dr. Stark Klein  REFERRING DIAG: Left breast cancer  THERAPY DIAG:  Malignant neoplasm of upper-inner quadrant of left breast in female, estrogen receptor  negative ([place])  Abnormal posture  Aftercare following surgery for neoplasm  Rationale for Evaluation and Treatment: Rehabilitation  ONSET DATE: 12/15/2022  SUBJECTIVE:                                                                                                                                                                                           SUBJECTIVE STATEMENT: Patient reports she underwent chemotherapy 06/06/2022 - 10/07/2022. She had a left lumpectomy and sentinel node biopsy on 12/15/2022 with 5 negative nodes removed.  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 It is triple negative with a Ki67 of 80%. She smokes 7-8 cigarettes per day. She underwent chemotherapy 06/06/2022 -  10/07/2022. She had a left lumpectomy and sentinel node biopsy on 12/15/2022 with 5 negative nodes removed.  PATIENT GOALS:  Reassess how my recovery is going related to arm function, pain, and swelling.  PAIN:  Are you having pain? {OPRCPAIN:27236}  PRECAUTIONS: Recent Surgery, left UE Lymphedema risk  ACTIVITY LEVEL / LEISURE: ***   OBJECTIVE:   PATIENT SURVEYS:  QUICK DASH: ***  OBSERVATIONS: ***  POSTURE:  ***  LYMPHEDEMA ASSESSMENT:   UPPER EXTREMITY AROM/PROM:   A/PROM RIGHT   eval    Shoulder extension 46  Shoulder flexion 149  Shoulder abduction 160  Shoulder internal rotation 50  Shoulder external rotation 70                          (Blank rows = not tested)   A/PROM LEFT   eval LEFT 01/05/2023  Shoulder extension 40   Shoulder flexion 123   Shoulder abduction 147   Shoulder internal rotation 53   Shoulder external rotation 57                           (Blank rows = not tested)     CERVICAL AROM: All within normal limits   UPPER EXTREMITY STRENGTH: WNL     LYMPHEDEMA ASSESSMENTS:    LANDMARK RIGHT   eval RIGHT 01/05/2023  10 cm proximal to olecranon process 31   Olecranon process 25.7    10 cm proximal to ulnar styloid process 22.7   Just proximal to ulnar styloid process 17.2   Across hand at thumb web space 20.2   At base of 2nd digit 7.5   (Blank rows = not tested)   LANDMARK LEFT   eval LEFT 01/05/2023  10 cm proximal to olecranon process 31.4   Olecranon process 26.3   10 cm proximal to ulnar styloid process 21.7   Just proximal to ulnar styloid process 17.2   Across hand at thumb web space 20.4   At base of 2nd digit 7.3   (Blank rows = not tested)   Surgery type/Date: Left lumpectomy and sentinel node biopsy 12/15/2022 Number of lymph nodes removed: 5 Current/past treatment (chemo, radiation, hormone therapy): Neo chemo Other symptoms:  Heaviness/tightness {yes/no:20286} Pain {yes/no:20286} Pitting edema {yes/no:20286} Infections {yes/no:20286} Decreased scar mobility {yes/no:20286} Stemmer sign {yes/no:20286}  PATIENT EDUCATION:  Education details: *** Person educated: {Person educated:25204} Education method: {Education Method:25205} Education comprehension: {Education Comprehension:25206}  HOME EXERCISE PROGRAM: Reviewed previously given post op HEP. ***  ASSESSMENT:  CLINICAL IMPRESSION: ***  Pt will benefit from skilled therapeutic intervention to improve on the following deficits: Decreased knowledge of precautions, impaired UE functional use, pain, decreased ROM, postural dysfunction.   PT treatment/interventions: ADL/Self care home management, {rehab planned interventions:25118::"Therapeutic exercises","Therapeutic activity","Neuromuscular re-education","Balance training","Gait training","Patient/Family education","Self Care","Joint mobilization"}   GOALS: Goals reviewed with patient? Yes  LONG TERM GOALS:  (STG=LTG)  GOALS Name Target Date  Goal status  1 Pt will demonstrate she has regained full shoulder ROM and function post operatively compared to baselines.  Baseline: 10/25/2022 {GOALSTATUS:25110}  2  *** {GOALSTATUS:25110}   3  *** {GOALSTATUS:25110}  4  *** {GOALSTATUS:25110}     PLAN:  PT FREQUENCY/DURATION: ***  PLAN FOR NEXT SESSION: ***   [facility]  [address]  [place] [address]  [phone]  After Breast Cancer Class It is recommended you attend the ABC class to be educated on lymphedema  risk reduction. This class is free of charge and lasts for 1 hour. It is a 1-time class. You will need to download the Webex app either on your phone or computer. We will send you a link the night before or the morning of the class. You should be able to click on that link to join the class. This is not a confidential class. You don't have to turn your camera on, but other participants may be able to see your email address.  Scar massage You can begin gentle scar massage to you incision sites. Gently place one hand on the incision and move the skin (without sliding on the skin) in various directions. Do this for a few minutes and then you can gently massage either coconut oil or vitamin E cream into the scars.  Compression garment You should continue wearing your compression bra until you feel like you no longer have swelling.  Home exercise Program Continue doing the exercises you were given until you feel like you can do them without feeling any tightness at the end.   Walking Program Studies show that 30 minutes of walking per day (fast enough to elevate your heart rate) can significantly reduce the risk of a cancer recurrence. If you can't walk due to other medical reasons, we encourage you to find another activity you could do (like a stationary bike or water exercise).  Posture After breast cancer surgery, people frequently sit with rounded shoulders posture because it puts their incisions on slack and feels better. If you sit like this and scar tissue forms in that position, you can become very tight and have pain sitting or standing with good posture. Try to be  aware of your posture and sit and stand up tall to heal properly.  Follow up PT: It is recommended you return every 3 months for the first 3 years following surgery to be assessed on the SOZO machine for an L-Dex score. This helps prevent clinically significant lymphedema in 95% of patients. These follow up screens are 10 minute appointments that you are not billed for.  Callan Yontz,MARTI COOPER, PT 01/04/2023, 11:43 AM

## 2023-01-05 ENCOUNTER — Encounter: Payer: Self-pay | Admitting: Rehabilitation

## 2023-01-05 ENCOUNTER — Ambulatory Visit: Payer: BC Managed Care – PPO | Attending: General Surgery | Admitting: Rehabilitation

## 2023-01-05 DIAGNOSIS — Z171 Estrogen receptor negative status [ER-]: Secondary | ICD-10-CM

## 2023-01-05 DIAGNOSIS — R293 Abnormal posture: Secondary | ICD-10-CM

## 2023-01-05 DIAGNOSIS — C50212 Malignant neoplasm of upper-inner quadrant of left female breast: Secondary | ICD-10-CM | POA: Insufficient documentation

## 2023-01-05 DIAGNOSIS — Z483 Aftercare following surgery for neoplasm: Secondary | ICD-10-CM

## 2023-01-05 NOTE — Therapy (Addendum)
 OUTPATIENT PHYSICAL THERAPY BREAST CANCER POST OP FOLLOW UP   Patient Name: Sydney Lee MRN: [id number] DOB:09-Jan-1961, 62 y.o., female Today's Date: 01/05/2023  END OF SESSION:  PT End of Session - 01/05/23 1150     Visit Number 2    Number of Visits 2    Date for PT Re-Evaluation 11/24/22    PT Start Time 1100    PT Stop Time 1140    PT Time Calculation (min) 40 min    Activity Tolerance Patient tolerated treatment well    Behavior During Therapy [facility] for tasks assessed/performed             Past Medical History:  Diagnosis Date   Anxiety    Arthritis    Breast cancer (HCC)    Cardiomyopathy (HCC) 10/2022   likley chemotherapy/adriamycin induced cardiomyopathy   Depression    Diabetes mellitus without complication (HCC)    type 2   Hypertension    Neuromuscular disorder (HCC)    neuropathy hands/feets   Past Surgical History:  Procedure Laterality Date   BREAST BIOPSY  12/13/2022   MM LT RADIOACTIVE SEED LOC MAMMO GUIDE 12/13/2022 [facility] MAMMOGRAPHY   BREAST LUMPECTOMY WITH RADIOACTIVE SEED AND SENTINEL LYMPH NODE BIOPSY Left 12/15/2022   Procedure: LEFT BREAST LUMPECTOMY WITH RADIOACTIVE SEED AND SENTINEL LYMPH NODE BIOPSY;  Surgeon: Almond Lint, MD;  Location: [facility];  Service: General;  Laterality: Left;   PORTACATH PLACEMENT N/A 06/01/2022   Procedure: INSERTION PORT-A-CATH WITH ULTRASOUND GUIDANCE;  Surgeon: Almond Lint, MD;  Location: [facility];  Service: General;  Laterality: N/A;   Patient Active Problem List   Diagnosis Date Noted   Fatigue due to treatment 10/13/2022   Malnutrition of moderate degree 09/17/2022   Severe sepsis with acute organ dysfunction (HCC) 09/15/2022   HTN (hypertension) 09/15/2022   Diabetes mellitus, type II (HCC) 09/15/2022   Anxiety 09/15/2022   Depression 09/15/2022   Port-A-Cath in place 06/06/2022   Infusion reaction 06/06/2022   Genetic testing 06/03/2022   Family history of breast cancer 05/25/2022   Family history of  ovarian cancer 05/25/2022   Malignant neoplasm of upper-inner quadrant of left breast in female, estrogen receptor negative (HCC) 05/23/2022    REFERRING PROVIDER: Dr. Almond Lint  REFERRING DIAG: Left breast cancer  THERAPY DIAG:  Malignant neoplasm of upper-inner quadrant of left breast in female, estrogen receptor negative (HCC)  Abnormal posture  Aftercare following surgery for neoplasm  Rationale for Evaluation and Treatment: Rehabilitation  ONSET DATE: 12/15/2022  SUBJECTIVE:  SUBJECTIVE STATEMENT: I am doing well except for the large seroma in the armpit.  They will probably drain it again today.  Having neuropathy in hands in feet.  I may have to do more depending on the heart.  Denies problems with balance. I will be going back to work Monday.   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 It is triple negative with a Ki67 of 80%. She smokes 7-8 cigarettes per day. She underwent chemotherapy 06/06/2022 - 10/07/2022 stopped due to cardiotoxicity and neurotoxicity.  She had a left lumpectomy and sentinel node biopsy on 12/15/2022 with 5 negative nodes removed.  PATIENT GOALS:  Reassess how my recovery is going related to arm function, pain, and swelling.  PAIN:  Are you having pain? No  PRECAUTIONS: Recent Surgery, left UE Lymphedema risk  ACTIVITY LEVEL / LEISURE: back to normal.     OBJECTIVE:   PATIENT SURVEYS:  QUICK DASH: 15%  OBSERVATIONS: Incisions are healed, moderate size fluid collection in the axilla  POSTURE:  Rounded shoulders   LYMPHEDEMA ASSESSMENT:   UPPER EXTREMITY AROM/PROM:   A/PROM RIGHT   eval    Shoulder extension 46  Shoulder flexion 149  Shoulder abduction 160  Shoulder internal  rotation 50  Shoulder external rotation 70                          (Blank rows = not tested)   A/PROM LEFT   eval LEFT 01/05/2023  Shoulder extension 40 50  Shoulder flexion 123 142  Shoulder abduction 147 162  Shoulder internal rotation 53   Shoulder external rotation 57 80                          (Blank rows = not tested)     CERVICAL AROM: All within normal limits   UPPER EXTREMITY STRENGTH: WNL     LYMPHEDEMA ASSESSMENTS:    LANDMARK RIGHT   eval RIGHT 01/05/2023  10 cm proximal to olecranon process 31 30  Olecranon process 25.7 25.5  10 cm proximal to ulnar styloid process 22.7 19.6  Just proximal to ulnar styloid process 17.2 17  Across hand at thumb web space 20.2 20  At base of 2nd digit 7.5 7.3  (Blank rows = not tested)   LANDMARK LEFT   eval LEFT 01/05/2023  10 cm proximal to olecranon process 31.4 30.5  Olecranon process 26.3 25.5  10 cm proximal to ulnar styloid process 21.7 19  Just proximal to ulnar styloid process 17.2 17  Across hand at thumb web space 20.4 20.3  At base of 2nd digit 7.3   (Blank rows = not tested)   PATIENT EDUCATION:  Education details: post op education per below - how to compression and limit motion post draining today Person educated: Patient Education method: Explanation Education comprehension: verbalized understanding  HOME EXERCISE PROGRAM: Reviewed previously given post op HEP.   ASSESSMENT: CLINICAL IMPRESSION: Pt is doing well post operatively except for large axillary seroma needing a second draining today.  She thinks she is not able to do the ABC class due to her work schedule so she was given written handouts today.   Pt will benefit from skilled therapeutic intervention to improve on the following deficits: Decreased knowledge of precautions, impaired UE functional use, pain, decreased ROM, postural dysfunction.   PT treatment/interventions: ADL/Self care home management,  Patient/Family education and  Re-evaluation   GOALS: Goals reviewed with patient? Yes  LONG TERM GOALS:  (STG=LTG)  GOALS Name Target Date  Goal status  1 Pt will demonstrate she has regained full shoulder ROM and function post operatively compared to baselines.  Baseline: 10/25/2022 MET            4        PLAN:  PT FREQUENCY/DURATION: SOZO surveillance   PLAN FOR NEXT SESSION: SOZO only  PHYSICAL THERAPY DISCHARGE SUMMARY  Visits from Start of Care: 2  Current functional level related to goals / functional outcomes: Per above   Remaining deficits: lymphedema risk   Education / Equipment: Self care HEP   Plan: Patient agrees to discharge.       [facility]  [address], [address]  [place] [address]  [phone]  After Breast Cancer Class It is recommended you attend the ABC class to be educated on lymphedema risk reduction. This class is free of charge and lasts for 1 hour. It is a 1-time class. You will need to download the Webex app either on your phone or computer. We will send you a link the night before or the morning of the class. You should be able to click on that link to join the class. This is not a confidential class. You don't have to turn your camera on, but other participants may be able to see your email address.  Scar massage You can begin gentle scar massage to you incision sites. Gently place one hand on the incision and move the skin (without sliding on the skin) in various directions. Do this for a few minutes and then you can gently massage either coconut oil or vitamin E cream into the scars.  Compression garment You should continue wearing your compression bra until you feel like you no longer have swelling.  Home exercise Program Continue doing the exercises you were given until you feel like you can do them without feeling any tightness at the end.   Walking Program Studies show that 30 minutes of walking per day (fast enough to  elevate your heart rate) can significantly reduce the risk of a cancer recurrence. If you can't walk due to other medical reasons, we encourage you to find another activity you could do (like a stationary bike or water exercise).  Posture After breast cancer surgery, people frequently sit with rounded shoulders posture because it puts their incisions on slack and feels better. If you sit like this and scar tissue forms in that position, you can become very tight and have pain sitting or standing with good posture. Try to be aware of your posture and sit and stand up tall to heal properly.  Follow up PT: It is recommended you return every 3 months for the first 3 years following surgery to be assessed on the SOZO machine for an L-Dex score. This helps prevent clinically significant lymphedema in 95% of patients. These follow up screens are 10 minute appointments that you are not billed for.  Idamae Lusher, PT 01/05/2023, 11:50 AM

## 2023-01-06 ENCOUNTER — Inpatient Hospital Stay: Payer: BC Managed Care – PPO | Attending: Physician Assistant | Admitting: Hematology and Oncology

## 2023-01-06 DIAGNOSIS — T451X5A Adverse effect of antineoplastic and immunosuppressive drugs, initial encounter: Secondary | ICD-10-CM | POA: Insufficient documentation

## 2023-01-06 DIAGNOSIS — C50212 Malignant neoplasm of upper-inner quadrant of left female breast: Secondary | ICD-10-CM | POA: Diagnosis not present

## 2023-01-06 DIAGNOSIS — G620 Drug-induced polyneuropathy: Secondary | ICD-10-CM

## 2023-01-06 DIAGNOSIS — I427 Cardiomyopathy due to drug and external agent: Secondary | ICD-10-CM | POA: Diagnosis not present

## 2023-01-06 DIAGNOSIS — Z171 Estrogen receptor negative status [ER-]: Secondary | ICD-10-CM

## 2023-01-06 NOTE — Assessment & Plan Note (Addendum)
This is a very pleasant 62 year old female patient with newly diagnosed self detected left breast mass status post imaging and biopsy with pathology showing invasive poorly differentiated adenocarcinoma, grade 3, triple negative, high proliferation index of 80% presented in the breast [place] for additional recommendations.  Given triple negative tumor larger than 2 cm at diagnosis, we have discussed about considering neoadjuvant chemotherapy.  She completed neoadjuvant portion of CarboTaxol Keytruda followed by 1 cycle of AC with Keytruda.  She then complained of persistent fatigue and hence we have ordered a repeat echo and some other workup which showed a significant drop in her ejection fraction.  She was thought to have cardiac toxicity secondary to anthracyclines versus Keytruda and hence referred to cardiac oncology.  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  After much discussion and reconsultation with Dr. Haroldine Laws from cardiac oncology, given complete pathologic response and concern for further cardiac toxicity we have made a decision to eliminate adjuvant immunotherapy.  She will now proceed with adjuvant radiation.  No role for antiestrogen therapy. I mentioned this very clearly to the patient.  She is agreeable to all the recommendations.

## 2023-01-06 NOTE — Progress Notes (Signed)
[facility] Follow up:    Sydney Lee, [address] [place] [address]   DIAGNOSIS:  Cancer Staging  Malignant neoplasm of upper-inner quadrant of left breast in female, estrogen receptor negative ([place]) Staging form: Breast, AJCC 8th Edition - Clinical: Stage IIB (cT2, cN0, cM0, G3, ER-, PR-, HER2-) - Signed by Benay Pike, MD on 05/25/2022 Stage prefix: Initial diagnosis Histologic grading system: 3 grade system   Malignant neoplasm of upper-inner quadrant of left breast in female, estrogen receptor negative ([place]) This is a very pleasant 62 year old female patient with newly diagnosed self detected left breast mass status post imaging and biopsy with pathology showing invasive poorly differentiated adenocarcinoma, grade 3, triple negative, high proliferation index of 80% presented in the breast [place] for additional recommendations.  Given triple negative tumor larger than 2 cm at diagnosis, we have discussed about considering neoadjuvant chemotherapy.  She completed neoadjuvant portion of CarboTaxol Keytruda followed by 1 cycle of AC with Keytruda.  She then complained of persistent fatigue and hence we have ordered a repeat echo and some other workup which showed a significant drop in her ejection fraction.  She was thought to have cardiac toxicity secondary to anthracyclines versus Keytruda and hence referred to cardiac oncology.  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  After much discussion and reconsultation with Dr. Haroldine Laws from cardiac oncology, given complete pathologic response and concern for further cardiac toxicity we have made a decision to eliminate adjuvant immunotherapy.  She will now proceed with adjuvant radiation.  No role for antiestrogen therapy. I mentioned this very clearly to the patient.  She is agreeable to all the recommendations.  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  She is currently on Cymbalta.  We will continue to monitor  Chemotherapy induced cardiomyopathy ([place]) It is not clear if this is related to anthracycline versus immunotherapy.  MRI however showed diffuse myocardial fibrosis hence we have discussed about eliminating further adjuvant immunotherapy.   SUMMARY OF ONCOLOGIC HISTORY: Oncology History  Malignant neoplasm of upper-inner quadrant of left breast in female, estrogen receptor negative ([place])  04/27/2022 Mammogram   Diagnostic mammogram showed indeterminate solid mass in the 10:00 location of the left breast.  Small satellite nodule adjacent to the index mass.  Borderline lymph node has cortical thickening of 2.9 mm.  Other lymph nodes have normal morphology.   05/16/2022 Pathology Results   Left breast needle core biopsy showed invasive poorly differentiated adenocarcinoma, grade 3 with tumor necrosis, lymph node biopsy benign reactive, negative for carcinoma.  Prognostics from the tumor showed ER 0%, negative, PR 0%, negative, Ki-67 of 80% and HER2 negative   05/25/2022 Cancer Staging   Staging form: Breast, AJCC 8th Edition - Clinical: Stage IIB (cT2, cN0, cM0, G3, ER-, PR-, HER2-) - Signed by Benay Pike, MD on 05/25/2022 Stage prefix: Initial diagnosis Histologic grading system: 3 grade system   05/25/2022 Genetic Testing   [facility] CancerNext-Expanded Panel was Negative. Report date was 06/02/2022.  The CancerNext-Expanded gene panel offered by [facility] and includes sequencing, rearrangement, and RNA analysis for the following 77 genes: AIP, ALK, APC, ATM, AXIN2, BAP1, BARD1, BLM, BMPR1A, BRCA1, BRCA2, BRIP1, CDC73, CDH1, CDK4, CDKN1B, CDKN2A, CHEK2, CTNNA1, DICER1, FANCC, FH, FLCN, GALNT12, KIF1B, LZTR1, MAX, MEN1, MET, MLH1, MSH2, MSH3, MSH6, MUTYH, NBN, NF1, NF2, NTHL1, PALB2, PHOX2B, PMS2, POT1, PRKAR1A, PTCH1, PTEN, RAD51C, RAD51D, RB1, RECQL,  RET, SDHA, SDHAF2, SDHB, SDHC,  SDHD, SMAD4, SMARCA4, SMARCB1, SMARCE1, STK11, SUFU, TMEM127, TP53, TSC1, TSC2, VHL and XRCC2 (sequencing and deletion/duplication); EGFR, EGLN1, HOXB13, KIT, MITF, PDGFRA, POLD1, and POLE (sequencing only); EPCAM and GREM1 (deletion/duplication only).    06/06/2022 - 10/07/2022 Chemotherapy   Patient is on Treatment Plan : BREAST Pembrolizumab (200) D1 + Carboplatin (5) D1 + Paclitaxel (80) D1,8,15 q21d X 4 cycles / Pembrolizumab (200) D1 + AC D1 q21d x 4 cycles     12/15/2022 Definitive Surgery   She had left breast lumpectomy on January 11 which showed no residual carcinoma in situ or invasive carcinoma identified ypT0 all margins negative.  Sentinel lymph nodes 5 out of 5 without any involvement for carcinoma.    CURRENT THERAPY: Taxol/Carbo/Keytruda  INTERVAL HISTORY:  Sydney Lee 62 y.o. female returns for follow-up and evaluation.  She continues to recover.  She had seroma drained recently, still has some swelling in the axilla however overall improving.  Neuropathy continues to improve.  She is feeling well. Rest of the pertinent 10 point ROS reviewed and negative  Patient Active Problem List   Diagnosis Date Noted   Chemotherapy-induced peripheral neuropathy ([place]) 01/06/2023   Chemotherapy induced cardiomyopathy ([place]) 01/06/2023   Fatigue due to treatment 10/13/2022   Malnutrition of moderate degree 09/17/2022   Severe sepsis with acute organ dysfunction ([place]) 09/15/2022   HTN (hypertension) 09/15/2022   Diabetes mellitus, type II ([place]) 09/15/2022   Anxiety 09/15/2022   Depression 09/15/2022   Port-A-Cath in place 06/06/2022   Infusion reaction 06/06/2022   Genetic testing 06/03/2022   Family history of breast cancer 05/25/2022   Family history of ovarian cancer 05/25/2022   Malignant neoplasm of upper-inner quadrant of left breast in female, estrogen receptor negative ([place]) 05/23/2022    is allergic to [facility] [aprepitant], codeine, and latex.  MEDICAL HISTORY: Past  Medical History:  Diagnosis Date   Anxiety    Arthritis    Breast cancer ([place])    Cardiomyopathy ([place]) 10/2022   likley chemotherapy/adriamycin induced cardiomyopathy   Depression    Diabetes mellitus without complication ([place])    type 2   Hypertension    Neuromuscular disorder (HCC)    neuropathy hands/feets    SURGICAL HISTORY: Past Surgical History:  Procedure Laterality Date   BREAST BIOPSY  12/13/2022   MM LT RADIOACTIVE SEED LOC MAMMO GUIDE 12/13/2022 [facility] MAMMOGRAPHY   BREAST LUMPECTOMY WITH RADIOACTIVE SEED AND SENTINEL LYMPH NODE BIOPSY Left 12/15/2022   Procedure: LEFT BREAST LUMPECTOMY WITH RADIOACTIVE SEED AND SENTINEL LYMPH NODE BIOPSY;  Surgeon: Stark Klein, MD;  Location: [facility];  Service: General;  Laterality: Left;   PORTACATH PLACEMENT N/A 06/01/2022   Procedure: INSERTION PORT-A-CATH WITH ULTRASOUND GUIDANCE;  Surgeon: Stark Klein, MD;  Location: [facility];  Service: General;  Laterality: N/A;    SOCIAL HISTORY: Social History   Socioeconomic History   Marital status: Married    Spouse name: Not on file   Number of children: Not on file   Years of education: Not on file   Highest education level: Not on file  Occupational History   Not on file  Tobacco Use   Smoking status: Former    Packs/day: 1.00    Years: 40.00    Total pack years: 40.00    Types: Cigarettes    Quit date: 06/05/2022    Years since quitting: 0.5   Smokeless tobacco: Never  Vaping Use   Vaping Use: Never used  Substance and  Sexual Activity   Alcohol use: No   Drug use: No   Sexual activity: Not on file  Other Topics Concern   Not on file  Social History Narrative   Not on file   Social Determinants of Health   Financial Resource Strain: High Risk (06/14/2022)   Overall Financial Resource Strain (CARDIA)    Difficulty of Paying Living Expenses: Hard  Food Insecurity: No Food Insecurity (09/15/2022)   Hunger Vital Sign    Worried About Running Out of Food in the Last Year:  Never true    Ran Out of Food in the Last Year: Never true  Transportation Needs: No Transportation Needs (09/15/2022)   PRAPARE - Hydrologist (Medical): No    Lack of Transportation (Non-Medical): No  Physical Activity: Not on file  Stress: Not on file  Social Connections: Not on file  Intimate Partner Violence: Not At Risk (09/16/2022)   Humiliation, Afraid, Rape, and Kick questionnaire    Fear of Current or Ex-Partner: No    Emotionally Abused: No    Physically Abused: No    Sexually Abused: No    FAMILY HISTORY: Family History  Problem Relation Age of Onset   Leukemia Mother 99   Throat cancer Maternal Uncle 17   Prostate cancer Maternal Uncle    Breast cancer Cousin 57       maternal first cousin   Ovarian cancer Cousin        maternal first cousin   Breast cancer Cousin 35 - 51       paternal first cousin   Breast cancer Cousin 66 - 81       paternal first cousin    Review of Systems  Constitutional:  Negative for appetite change, chills, fatigue, fever and unexpected weight change.  HENT:   Negative for hearing loss, lump/mass and trouble swallowing.   Eyes:  Negative for eye problems and icterus.  Respiratory:  Negative for chest tightness, cough and shortness of breath.   Cardiovascular:  Negative for chest pain, leg swelling and palpitations.  Gastrointestinal:  Negative for abdominal distention, abdominal pain, constipation, diarrhea, nausea and vomiting.  Endocrine: Negative for hot flashes.  Genitourinary:  Negative for difficulty urinating.   Musculoskeletal:  Negative for arthralgias.  Skin:  Negative for itching and rash.  Neurological:  Negative for dizziness, extremity weakness, headaches and numbness.  Hematological:  Negative for adenopathy. Does not bruise/bleed easily.  Psychiatric/Behavioral:  Positive for depression. The patient is not nervous/anxious.       PHYSICAL EXAMINATION  ECOG PERFORMANCE STATUS: 1 -  Symptomatic but completely ambulatory  There were no vitals filed for this visit.  Physical exam deferred today, telephone visit  LABORATORY DATA:  CBC    Component Value Date/Time   WBC 4.3 11/16/2022 1416   WBC 11.3 (H) 09/21/2022 0310   RBC 3.10 (L) 11/16/2022 1416   HGB 10.6 (L) 11/16/2022 1416   HCT 31.4 (L) 11/16/2022 1416   PLT 171 11/16/2022 1416   MCV 101.3 (H) 11/16/2022 1416   MCH 34.2 (H) 11/16/2022 1416   MCHC 33.8 11/16/2022 1416   RDW 15.1 11/16/2022 1416   LYMPHSABS 1.5 11/16/2022 1416   MONOABS 0.5 11/16/2022 1416   EOSABS 0.1 11/16/2022 1416   BASOSABS 0.0 11/16/2022 1416    CMP     Component Value Date/Time   NA 139 11/16/2022 1416   K 3.7 11/16/2022 1416   CL 103 11/16/2022 1416   CO2  28 11/16/2022 1416   GLUCOSE 98 11/16/2022 1416   BUN 12 11/16/2022 1416   CREATININE 0.59 11/16/2022 1416   CALCIUM 9.2 11/16/2022 1416   PROT 6.9 11/16/2022 1416   ALBUMIN 3.9 11/16/2022 1416   AST 12 (L) 11/16/2022 1416   ALT 9 11/16/2022 1416   ALKPHOS 60 11/16/2022 1416   BILITOT 0.5 11/16/2022 1416   GFRNONAA >60 11/16/2022 1416   GFRAA >60 12/22/2018 2150   Total time spent: 13 minutes including history, physical exam, review of records, counseling and coordination of care   I connected with  Sydney Lee on 01/06/23 by a telephone application and verified that I am speaking with the correct person using two identifiers.   I discussed the limitations of evaluation and management by telemedicine. The patient expressed understanding and agreed to proceed.   All questions were answered. The patient knows to call the clinic with any problems, questions or concerns. We can certainly see the patient much sooner if necessary. Benay Pike MD

## 2023-01-06 NOTE — Assessment & Plan Note (Signed)
It is not clear if this is related to anthracycline versus immunotherapy.  MRI however showed diffuse myocardial fibrosis hence we have discussed about eliminating further adjuvant immunotherapy.

## 2023-01-06 NOTE — Assessment & Plan Note (Signed)
Grade 1 to grade 2, continues to improve.  She is currently on Cymbalta.  We will continue to monitor

## 2023-01-09 ENCOUNTER — Encounter: Payer: Self-pay | Admitting: *Deleted

## 2023-01-09 ENCOUNTER — Ambulatory Visit (INDEPENDENT_AMBULATORY_CARE_PROVIDER_SITE_OTHER): Payer: BC Managed Care – PPO | Admitting: Psychologist

## 2023-01-09 DIAGNOSIS — F411 Generalized anxiety disorder: Secondary | ICD-10-CM

## 2023-01-09 DIAGNOSIS — F330 Major depressive disorder, recurrent, mild: Secondary | ICD-10-CM | POA: Diagnosis not present

## 2023-01-09 DIAGNOSIS — Z634 Disappearance and death of family member: Secondary | ICD-10-CM

## 2023-01-09 NOTE — Progress Notes (Signed)
[place] Counselor/Therapist Progress Note  Patient ID: Sydney Lee, MRN: [id number],    Date: 01/09/2023  Time Spent: 10:-04 am to 10:34 am; total time: 30 minutes   This session was held via video webex teletherapy due to the coronavirus risk at this time. The patient consented to video teletherapy and was located at her home during this session. She is aware it is the responsibility of the patient to secure confidentiality on her end of the session. The provider was in a private home office for the duration of this session. Limits of confidentiality were discussed with the patient.   Treatment Type: Individual Therapy  Reported Symptoms: Less depression and anxiety  Mental Status Exam: Appearance:  Well Groomed     Behavior: Appropriate  Motor: Normal  Speech/Language:  Clear and Coherent  Affect: Appropriate  Mood: normal  Thought process: normal  Thought content:   WNL  Sensory/Perceptual disturbances:   WNL  Orientation: oriented to person, place, and time/date  Attention: Good  Concentration: Good  Memory: WNL  Fund of knowledge:  Good  Insight:   Good  Judgment:  Good  Impulse Control: Good   Risk Assessment: Danger to Self:  No Self-injurious Behavior: No Danger to Others: No Duty to Warn:no Physical Aggression / Violence:No  Access to Firearms a concern: No  Gang Involvement:No   Subjective: Beginning the session, patient described herself as doing well. After reviewing the treatment plan, patient reflected on what has been going well for her including talking about the holidays. Per the patient, currently there is no sign of cancer, which is exciting and she returns to work tomorrow. Patient identified social support as being the biggest factor that has helped her. She processed thoughts and emotions. She was agreeable to following up. She denied suicidal and homicidal ideation.    Interventions:  Worked on developing a therapeutic  relationship with the patient using active listening and reflective statements. Provided emotional support using empathy and validation. Reviewed the treatment plan with the patient. Reviewed events since the intake. Praised the patient for doing well and explored what has assisted the patient. Identified goals for the session. Processed thoughts and emotions. Congratulated patient on currently having no signs of cancer. Identified the theme of social support and how that has helped the patient. Used socratic questions to assist the patient. Explored potential barriers and how to overcome those barriers. Provided empathic statements. Assessed for suicidal and homicidal ideation.   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   Plan:  Goals Work through the grieving process and face reality of own death Accept emotional support from others around them Live life to the fullest, event though time may be limited Become as knowledgeable about the medical condition  Reduce fear, anxiety about the health condition  Accept the illness Accept the role of psychological and behavioral factors  Stabilize anxiety level wile increasing ability to function Learn and implement coping skills that result in a reduction of anxiety  Alleviate depressive symptoms Recognize, accept, and cope with depressive feelings Develop healthy thinking patterns Develop healthy interpersonal relationships  Objectives target date for all objectives is 11/09/2023 Identify feelings associated with the illness Family members share with each other feelings Identify the losses or limitations that have been experienced Verbalize acceptance of the reality of the medical condition Commit to learning and implement a proactive approach to managing personal stresses Verbalize an understanding of the medical condition  Work with therapist to  develop a plan for coping with stress Learn and implement skills for managing stress Engage in social, productive activities that are possible Engage in faith based activities implement positive imagery Identify coping skills and sources of emotional support Patient's partner and family members verbalize their fears regarding severity of health condition Identify sources of emotional distress  Learning and implement calming skills to reduce overall anxiety Learn and implement problem solving strategies Identify and engage in pleasant activities Learning and implement personal and interpersonal skills to reduce anxiety and improve interpersonal relationships Learn to accept limitations in life and commit to tolerating, rather than avoiding, unpleasant emotions while accomplishing meaningful goals Identify major life conflicts from the past and present that form the basis for present anxiety Learn and implement behavioral strategies Verbalize an understanding and resolution of current interpersonal problems Learn and implement problem solving and decision making skills Learn and implement conflict resolution skills to resolve interpersonal problems Verbalize an understanding of healthy and unhealthy emotions verbalize insight into how past relationships may be influence current experiences with depression Use mindfulness and acceptance strategies and increase value based behavior  Increase hopeful statements about the future.   Interventions Teach about stress and ways to handle stress Assist the patient in developing a coping action plan for stressors Conduct skills based training for coping strategies Train problem focused skills Sort out what activities the individual can do Encourage patient to rely upon his/her spiritual faith Teach the patient to use guided imagery Probe and evaluate family's ability to provide emotional support Allow family to share their fears Assist the patient  in identifying, sorting through, and verbalizing the various feelings generated by his/her medical condition Meet with family members  Ask patient list out limitations  Use stress inoculation training  Use Acceptance and Commitment Therapy to help client accept uncomfortable realities in order to accomplish value-consistent goals Reinforce the client's insight into the role of his/her past emotional pain and present anxiety  Discuss examples demonstrating that unrealistic worry overestimates the probability of threats and underestimate patient's ability  Assist the patient in analyzing his or her worries Help patient understand that avoidance is reinforcing  Behavioral activation help the client explore the relationship, nature of the dispute,  Help the client develop new interpersonal skills and relationships Conduct Problem so living therapy Teach conflict resolution skills Use a process-experiential approach Conduct TLDP Conduct ACT  Goals Begin a healthy grieving process Objectives target date for all objectives is 11/09/2023 Tell in detail the story of the current loss that is triggering symptoms Read books on the topic of grief Watch videos on the theme of grief Begin verbalizing feelings associated with the loss Attend a grief support group express thoughts and feelings about the deceased Identify and voice positives about the deceased implement acts of spiritual faith  Interventions create a safe environment and actively build trust use empathy, compassion, and support ask the patient to write a letter to the lost person conduct empty chair ask the patient to discuss and list the positives and negative aspects of the person encourage patient to rely upon his/her spiritual faith  ask client to read books on grief ask patient to watch videos about grief assist patient in identifying emotions  ask patient to attend support group   The patient and clinician reviewed the  treatment plan on 01/09/2023. The patient approved of the treatment plan.    Conception Chancy, PsyD

## 2023-01-09 NOTE — Progress Notes (Signed)
                Idris Edmundson, PsyD 

## 2023-01-12 NOTE — Progress Notes (Incomplete)
Location of Breast Cancer:  Malignant neoplasm of upper-inner quadrant of left breast in female, estrogen receptor negative  Histology per Pathology Report:  12/15/2022 A. BREAST, LEFT W/SEED, LUMPECTOMY: - No residual carcinoma in situ or invasive carcinoma identified, ypT0 - Biopsy site changes present - Treatment effect present - All margins negative for carcinoma in situ or invasive carcinoma B. BREAST, LEFT ADDITIONAL MEDIAL MARGIN, EXCISION: - Negative for carcinoma in situ or invasive carcinoma C. BREAST, LEFT ADDITIONAL SUPERIOR MARGIN, EXCISION: - Negative for carcinoma in situ or invasive carcinoma D. SENTINEL LYMPH NODE, LEFT AXIILARY #1, BIOPSY: - Negative for carcinoma (0/1) E. SENTINEL LYMPH NODE, LEFT AXIILARY #2, BIOPSY: - Negative for carcinoma (0/1) F. SENTINEL LYMPH NODE, LEFT AXIILARY #3, BIOPSY: - Negative for carcinoma (0/1) G. SENTINEL LYMPH NODE, LEFT AXIILARY #4, BIOPSY: - Negative for carcinoma (0/1) H. SENTINEL LYMPH NODE, LEFT AXIILARY #5, BIOPSY: - Negative for carcinoma (0/1) - Biopsy site changes present   Receptor Status: ER(Negative), PR (Negative), Her2-neu (Negative), Ki-67(80%)  Did patient present with symptoms (if so, please note symptoms) or was this found on screening mammography?: self detected left breast mass   Past/Anticipated interventions by surgeon, if any:  01/09/2023 Dr. Stark Klein (office visit) --Physical Examination:  Left breast with small seroma. 30 mL aspirated. Left axilla with large seroma again.  Seroma cath placed with 150 immediately out.  Pt given instructions regarding drain care.  --Assessment and Plan:  Postoperative left axillary seroma She has seen Dr. Chryl Heck and plan is to continue [place] if OK with cardiology ([place]).  Will need adjuvant XRT. 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 Mammogram due 04/2023 at [facility].  Return in about 2 weeks (around 01/23/2023) for breast cancer follow up.    12/15/2022 --Dr. Stark Klein Left Breast Radioactive seed localized lumpectomy Sentinel lymph node mapping and biopsy  Past/Anticipated interventions by medical oncology, if any:  Under care of Dr. Benay Pike 01/06/2023 Given triple negative tumor larger than 2 cm at diagnosis, we have discussed about considering neoadjuvant chemotherapy.   She completed neoadjuvant portion of CarboTaxol Keytruda (last cycle 09/07/22) followed by 1 cycle of AC with Keytruda (only cycle 10/06/22). She then complained of persistent fatigue and hence we have ordered a repeat echo and some other workup which showed a significant drop in her ejection fraction. She was thought to have cardiac toxicity secondary to anthracyclines versus Keytruda and hence referred to cardiac oncology.   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 After much discussion and reconsultation with Dr. Haroldine Laws from cardiac oncology, given complete pathologic response and concern for further cardiac toxicity we have made a decision to eliminate adjuvant immunotherapy.   She will now proceed with adjuvant radiation.   No role for antiestrogen therapy. I mentioned this very clearly to the patient.  She is agreeable to all the recommendations.  Lymphedema issues, if any:  ***    Pain issues, if any:  ***   SAFETY ISSUES: Prior radiation? *** Pacemaker/ICD? *** Possible current pregnancy? No--postmenopausal  Is the patient on methotrexate? ***  Current Complaints / other details:  ***

## 2023-01-14 NOTE — Progress Notes (Signed)
Radiation Oncology         [phone] ________________________________  Name: Sydney Lee MRN: [id number]  Date: 01/16/2023  DOB: May 11, 1961  Re-Evaluation Note  CC: [name], MD  Benay Pike, MD  No diagnosis found.  Diagnosis:  No evidence of residual invasive or in-situ carcinoma in the left breast s/p neoadjuvant chemotherapy   Stage IIB (cT2, cN0, cM0) Left Breast UIQ, Invasive poorly differentiated ductal adenocarcinoma , ER- / PR- / Her2-, Grade 3   Narrative:  The patient returns today to discuss radiation treatment options. She was seen in the multidisciplinary breast clinic on 05/25/22.   She underwent genetic testing on her consultation date. Results showed no clinically significant variants detected by BRCAplus or +RNAinsight testing.  Since her consultation date, she underwent a left axillary nodal biopsy on 06/02/22 which showed no evidence of carcinoma.   She has been treated neoadjuvant chemotherapy consisting of carbo/taxol and keytruda x 4 cycles from 06/06/22 through 10/07/22, followed by 6 cycles of AC Keytruda under Dr. Chryl Heck. In the midst of systemic treatment, the patient was hospitalized from 09/15/22 through 09/21/22 for management of severe sepsis secondary to pneumonia in the setting of chemotherapy neutropenia. Hospital course included ceftriaxone. Imaging performed while inpatient included a chest x-ray which showed findings consistent with pneumonia in the left lower lobe, and a chest CT which no other concerning findings other than pneumonia. 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   Bilateral breast MRI on 11/02/22 demonstrated a near complete treatment response s/p neoadjuvant chemo of the invasive carcinoma in the left breast. A small amount of thin residual non-mass enhancement at the biopsy  site was appreciated, measuring only 5 mm greatest thickness, and extending slightly anterior and slightly posterior to the biopsy clip artifact. (A component of this was though to possibly representing benign post biopsy changes). MRI otherwise showed no evidence of axillary lymphadenopathy and no evidence of malignancy in the right breast.    She opted to proceed with a left breast lumpectomy with SLN biopsies on 12/15/22 under the care of Dr. Barry Dienes. Pathology from the procedure showed no evidence of residual invasive or in-situ carcinoma. Nodal status of 5/5 left axillary sentinel lymph node excisions negative for carcinoma.    Post-operatively, the patient developed a large left axillary seroma. This was aspirated on 01/02/23 and yielded 345 ml of fluid. During her most recent follow-up visit with Dr. Barry Dienes on 01/09/23, the seroma was again aspirated and yielded 150 ml of fluid.   In the setting of her triple negative disease, the patient will not require adjuvant hormonal therapy. Dr. Chryl Heck would however like her to continue with [facility] pending clearance from cardiology.   On review of systems, the patient reports ***. She denies *** and any other symptoms.    Allergies:  is allergic to [facility] [aprepitant], codeine, and latex.  Meds: Current Outpatient Medications  Medication Sig Dispense Refill   acetaminophen (TYLENOL) 500 MG tablet Take 1,000 mg by mouth every 6 (six) hours as needed for moderate pain.     ALPRAZolam (XANAX) 0.25 MG tablet Take 1 tablet (0.25 mg total) by mouth 2 (two) times daily as needed for anxiety. 30 tablet 0   atorvastatin (LIPITOR) 10 MG tablet Take 10 mg by mouth every Monday, Wednesday, and Friday.     DULoxetine (CYMBALTA) 30 MG capsule Take 30 mg by mouth  2 (two) times daily.     JARDIANCE 10 MG TABS tablet Take 10 mg by mouth in the morning.     lidocaine-prilocaine (EMLA) cream Apply to affected area once (Patient taking differently: Apply 1 Application  topically daily as needed (prior to port access).) 30 g 3   losartan (COZAAR) 100 MG tablet Take 100 mg by mouth in the morning.     meclizine (ANTIVERT) 25 MG tablet Take 25 mg by mouth 3 (three) times daily as needed for dizziness.     metFORMIN (GLUCOPHAGE) 1000 MG tablet Take 1,000 mg by mouth in the morning and at bedtime.     metoprolol succinate (TOPROL XL) 50 MG 24 hr tablet Take 1 tablet (50 mg total) by mouth at bedtime. Take with or immediately following a meal. 30 tablet 5   ondansetron (ZOFRAN) 8 MG tablet Take 1 tablet (8 mg total) by mouth 2 (two) times daily as needed. Start on the third day after carboplatin and AC chemotherapy. (Patient not taking: Reported on 12/06/2022) 30 tablet 1   oxyCODONE (OXY IR/ROXICODONE) 5 MG immediate release tablet Take 1 tablet (5 mg total) by mouth every 6 (six) hours as needed for severe pain. 5 tablet 0   OZEMPIC, 1 MG/DOSE, 4 MG/3ML SOPN Inject 1 mg into the skin every Wednesday.     predniSONE (DELTASONE) 2.5 MG tablet Take 1 tablet (2.5 mg total) by mouth daily with breakfast. 15 tablet 0   prochlorperazine (COMPAZINE) 10 MG tablet Take 1 tablet (10 mg total) by mouth every 6 (six) hours as needed (Nausea or vomiting). (Patient not taking: Reported on 12/06/2022) 30 tablet 1   No current facility-administered medications for this encounter.    Physical Findings: The patient is in no acute distress. Patient is alert and oriented.  vitals were not taken for this visit.  No significant changes. Lungs are clear to auscultation bilaterally. Heart has regular rate and rhythm. No palpable cervical, supraclavicular, or axillary adenopathy. Abdomen soft, non-tender, normal bowel sounds. Right Breast: no palpable mass, nipple discharge or bleeding. Left Breast: ***  Lab Findings: Lab Results  Component Value Date   WBC 4.3 11/16/2022   HGB 10.6 (L) 11/16/2022   HCT 31.4 (L) 11/16/2022   MCV 101.3 (H) 11/16/2022   PLT 171 11/16/2022     Radiographic Findings: MM Breast Surgical Specimen  Result Date: 12/15/2022 CLINICAL DATA:  62 year old with a biopsy-proven invasive ductal carcinoma in the [place] of the LEFT breast for which the patient underwent neoadjuvant chemotherapy. Radioactive seed localization was performed on 12/13/2022 in anticipation of today's lumpectomy. EXAM: SPECIMEN RADIOGRAPH OF THE LEFT BREAST COMPARISON:  Previous exam(s). FINDINGS: Status post excision of the LEFT breast. The radioactive seed and the ribbon shaped tissue marking clip are present within the non-compressed specimen. The seed is intact. This was discussed with the operating room nurse at the time of interpretation on 12/15/2022 at 10:54 a.m. IMPRESSION: Specimen radiograph of the LEFT breast. Electronically Signed   By: Evangeline Dakin M.D.   On: 12/15/2022 10:56   Impression:  No evidence of residual invasive or in-situ carcinoma in the left breast s/p neoadjuvant chemotherapy   Stage IIB (cT2, cN0, cM0) Left Breast UIQ, Invasive poorly differentiated ductal adenocarcinoma , ER- / PR- / Her2-, Grade 3   ***  Plan:  Patient is scheduled for CT simulation {date/later today}. ***  -----------------------------------  Blair Promise, PhD, MD  This document serves as a record of services personally  performed by Gery Pray, MD. It was created on his behalf by Roney Mans, a trained medical scribe. The creation of this record is based on the scribe's personal observations and the provider's statements to them. This document has been checked and approved by the attending provider.

## 2023-01-16 ENCOUNTER — Ambulatory Visit
Admission: RE | Admit: 2023-01-16 | Discharge: 2023-01-16 | Disposition: A | Discharge status: Home / Self Care | Payer: BC Managed Care – PPO | Source: Ambulatory Visit | Attending: Radiation Oncology | Admitting: Radiation Oncology

## 2023-01-16 ENCOUNTER — Ambulatory Visit: Payer: BC Managed Care – PPO | Admitting: Radiation Oncology

## 2023-01-16 ENCOUNTER — Other Ambulatory Visit: Payer: Self-pay

## 2023-01-16 ENCOUNTER — Encounter: Payer: Self-pay | Admitting: Radiation Oncology

## 2023-01-16 ENCOUNTER — Telehealth: Payer: Self-pay | Admitting: *Deleted

## 2023-01-16 VITALS — BP 108/79 | HR 100 | Resp 18 | Wt 195.1 lb

## 2023-01-16 DIAGNOSIS — Z7952 Long term (current) use of systemic steroids: Secondary | ICD-10-CM | POA: Diagnosis not present

## 2023-01-16 DIAGNOSIS — C50212 Malignant neoplasm of upper-inner quadrant of left female breast: Secondary | ICD-10-CM | POA: Diagnosis present

## 2023-01-16 DIAGNOSIS — Z171 Estrogen receptor negative status [ER-]: Secondary | ICD-10-CM | POA: Diagnosis not present

## 2023-01-16 DIAGNOSIS — Z7984 Long term (current) use of oral hypoglycemic drugs: Secondary | ICD-10-CM | POA: Insufficient documentation

## 2023-01-16 DIAGNOSIS — Z9221 Personal history of antineoplastic chemotherapy: Secondary | ICD-10-CM | POA: Diagnosis not present

## 2023-01-16 DIAGNOSIS — Z79899 Other long term (current) drug therapy: Secondary | ICD-10-CM | POA: Diagnosis not present

## 2023-01-16 NOTE — Telephone Encounter (Signed)
Patient left message with [facility] receptionist requesting call back for cough and could symptoms.  Contacted patient. Her daughter and grand dtr have colds with a cough.   She has had cough and runny nose for approx 24 hours. Her nose is running (clear/white) and she has been sneezing. The cough was productive of clear/white sputum one time, when she first woke up this morning, otherwise the cough is not productive. Slight headache. No elevation in temperature.   Dr. Chryl Heck informed. Per MD, patient can treat symptomatically with fluids, rest and OTC symptom management. She should call office if symptoms do not improve, get worse or if she begins to have increase temp.   Contacted patient to share MD instructions. Patient verbalized understanding and says she will call if anything changes.

## 2023-01-16 NOTE — Progress Notes (Signed)
Location of Breast Cancer:  Malignant neoplasm of upper-inner quadrant of left breast in female, estrogen receptor negative  Histology per Pathology Report:  12/15/2022 A. BREAST, LEFT W/SEED, LUMPECTOMY: - No residual carcinoma in situ or invasive carcinoma identified, ypT0 - Biopsy site changes present - Treatment effect present - All margins negative for carcinoma in situ or invasive carcinoma B. BREAST, LEFT ADDITIONAL MEDIAL MARGIN, EXCISION: - Negative for carcinoma in situ or invasive carcinoma C. BREAST, LEFT ADDITIONAL SUPERIOR MARGIN, EXCISION: - Negative for carcinoma in situ or invasive carcinoma D. SENTINEL LYMPH NODE, LEFT AXIILARY #1, BIOPSY: - Negative for carcinoma (0/1) E. SENTINEL LYMPH NODE, LEFT AXIILARY #2, BIOPSY: - Negative for carcinoma (0/1) F. SENTINEL LYMPH NODE, LEFT AXIILARY #3, BIOPSY: - Negative for carcinoma (0/1) G. SENTINEL LYMPH NODE, LEFT AXIILARY #4, BIOPSY: - Negative for carcinoma (0/1) H. SENTINEL LYMPH NODE, LEFT AXIILARY #5, BIOPSY: - Negative for carcinoma (0/1) - Biopsy site changes present   Receptor Status: ER(Negative), PR (Negative), Her2-neu (Negative), Ki-67(80%)  Did patient present with symptoms (if so, please note symptoms) or was this found on screening mammography?: self detected left breast mass   Past/Anticipated interventions by surgeon, if any:  01/09/2023 Dr. Stark Klein (office visit) --Physical Examination:  Left breast with small seroma. 30 mL aspirated. Left axilla with large seroma again.  Seroma cath placed with 150 immediately out.  Pt given instructions regarding drain care.  --Assessment and Plan:  Postoperative left axillary seroma She has seen Dr. Chryl Heck and plan is to continue [place] if OK with cardiology ([place]).  Will need adjuvant XRT. 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 Mammogram due 04/2023 at [facility].  Return in about 2 weeks (around 01/23/2023) for breast cancer follow up.    12/15/2022 --Dr. Stark Klein Left Breast Radioactive seed localized lumpectomy Sentinel lymph node mapping and biopsy  Past/Anticipated interventions by medical oncology, if any:  Under care of Dr. Benay Pike 01/06/2023 Given triple negative tumor larger than 2 cm at diagnosis, we have discussed about considering neoadjuvant chemotherapy.   She completed neoadjuvant portion of CarboTaxol Keytruda (last cycle 09/07/22) followed by 1 cycle of AC with Keytruda (only cycle 10/06/22). She then complained of persistent fatigue and hence we have ordered a repeat echo and some other workup which showed a significant drop in her ejection fraction. She was thought to have cardiac toxicity secondary to anthracyclines versus Keytruda and hence referred to cardiac oncology.   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 After much discussion and reconsultation with Dr. Haroldine Laws from cardiac oncology, given complete pathologic response and concern for further cardiac toxicity we have made a decision to eliminate adjuvant immunotherapy.   She will now proceed with adjuvant radiation.   No role for antiestrogen therapy. I mentioned this very clearly to the patient.  She is agreeable to all the recommendations.  Lymphedema issues, if any:  No    Pain issues, if any:  Some in her breast area.   SAFETY ISSUES: Prior radiation? no Pacemaker/ICD? no Possible current pregnancy? No--postmenopausal  Is the patient on methotrexate? no  Current Complaints / other details:  Patient states that she has a jp drain in her breast area.     Vitals:   01/16/23 0834  BP: 108/79  Pulse: 100  Resp: 18  SpO2: 100%  Weight: 88.5 kg

## 2023-01-23 ENCOUNTER — Encounter: Payer: Self-pay | Admitting: *Deleted

## 2023-01-26 ENCOUNTER — Other Ambulatory Visit (HOSPITAL_COMMUNITY): Payer: BC Managed Care – PPO

## 2023-01-26 ENCOUNTER — Ambulatory Visit
Admission: RE | Admit: 2023-01-26 | Discharge: 2023-01-26 | Disposition: A | Discharge status: Home / Self Care | Payer: BC Managed Care – PPO | Source: Ambulatory Visit | Attending: Radiation Oncology | Admitting: Radiation Oncology

## 2023-01-26 DIAGNOSIS — C50212 Malignant neoplasm of upper-inner quadrant of left female breast: Secondary | ICD-10-CM | POA: Insufficient documentation

## 2023-01-31 DIAGNOSIS — C50212 Malignant neoplasm of upper-inner quadrant of left female breast: Secondary | ICD-10-CM | POA: Diagnosis not present

## 2023-02-02 ENCOUNTER — Encounter: Payer: Self-pay | Admitting: *Deleted

## 2023-02-03 ENCOUNTER — Telehealth: Payer: Self-pay

## 2023-02-03 ENCOUNTER — Telehealth: Payer: Self-pay | Admitting: Family Medicine

## 2023-02-03 NOTE — Telephone Encounter (Signed)
Per 3/1 IB scheduled patient, patient aware of date and time of appointment.

## 2023-02-03 NOTE — Telephone Encounter (Signed)
Notified Patient of completion of FMLA and Disability Forms. Fax transmission confirmation received. Copy of forms placed for pick-up as requested by Patient. No other needs or concerns voiced at this time.

## 2023-02-06 ENCOUNTER — Ambulatory Visit: Payer: BC Managed Care – PPO | Admitting: Radiation Oncology

## 2023-02-06 ENCOUNTER — Ambulatory Visit: Payer: BC Managed Care – PPO | Admitting: Psychologist

## 2023-02-07 ENCOUNTER — Ambulatory Visit
Admission: RE | Admit: 2023-02-07 | Discharge: 2023-02-07 | Disposition: A | Discharge status: Home / Self Care | Payer: BC Managed Care – PPO | Source: Ambulatory Visit | Attending: Radiation Oncology | Admitting: Radiation Oncology

## 2023-02-07 ENCOUNTER — Ambulatory Visit: Payer: BC Managed Care – PPO

## 2023-02-07 ENCOUNTER — Other Ambulatory Visit: Payer: Self-pay

## 2023-02-07 DIAGNOSIS — Z510 Encounter for antineoplastic radiation therapy: Secondary | ICD-10-CM | POA: Diagnosis present

## 2023-02-07 DIAGNOSIS — C50212 Malignant neoplasm of upper-inner quadrant of left female breast: Secondary | ICD-10-CM | POA: Diagnosis present

## 2023-02-07 DIAGNOSIS — Z171 Estrogen receptor negative status [ER-]: Secondary | ICD-10-CM

## 2023-02-07 LAB — RAD ONC ARIA SESSION SUMMARY
Course Elapsed Days: 0
Plan Fractions Treated to Date: 1
Plan Prescribed Dose Per Fraction: 2.67 Gy
Plan Total Fractions Prescribed: 15
Plan Total Prescribed Dose: 40.05 Gy
Reference Point Dosage Given to Date: 2.67 Gy
Reference Point Session Dosage Given: 2.67 Gy
Session Number: 1

## 2023-02-07 MED ORDER — ALRA NON-METALLIC DEODORANT (RAD-ONC)
1.0000 | Freq: Once | TOPICAL | Status: AC
  Administered 2023-02-07: 1 via TOPICAL

## 2023-02-07 MED ORDER — RADIAPLEXRX EX GEL: Freq: Once | CUTANEOUS | Status: AC

## 2023-02-07 NOTE — Progress Notes (Signed)
Pt here for patient teaching. Pt given Radiation and You booklet, skin care instructions, Alra deodorant, and Radiaplex gel. Reviewed areas of pertinence such as fatigue, hair loss, nausea and vomiting, skin changes, breast tenderness, breast swelling, and taste changes. Pt able to give teach back of to pat skin, use unscented/gentle soap, and drink plenty of water, apply Radiaplex bid, avoid applying anything to skin within 4 hours of treatment, avoid wearing an under wire bra, and to use an electric razor if they must shave. Pt verbalizes understanding of information given and will contact nursing with any questions or concerns.     [email or web address]

## 2023-02-08 ENCOUNTER — Other Ambulatory Visit: Payer: Self-pay

## 2023-02-08 ENCOUNTER — Ambulatory Visit
Admission: RE | Admit: 2023-02-08 | Discharge: 2023-02-08 | Disposition: A | Discharge status: Home / Self Care | Payer: BC Managed Care – PPO | Source: Ambulatory Visit | Attending: Radiation Oncology | Admitting: Radiation Oncology

## 2023-02-08 DIAGNOSIS — Z510 Encounter for antineoplastic radiation therapy: Secondary | ICD-10-CM | POA: Diagnosis not present

## 2023-02-08 LAB — RAD ONC ARIA SESSION SUMMARY
Course Elapsed Days: 1
Plan Fractions Treated to Date: 2
Plan Prescribed Dose Per Fraction: 2.67 Gy
Plan Total Fractions Prescribed: 15
Plan Total Prescribed Dose: 40.05 Gy
Reference Point Dosage Given to Date: 5.34 Gy
Reference Point Session Dosage Given: 2.67 Gy
Session Number: 2

## 2023-02-09 ENCOUNTER — Other Ambulatory Visit: Payer: Self-pay

## 2023-02-09 ENCOUNTER — Ambulatory Visit
Admission: RE | Admit: 2023-02-09 | Discharge: 2023-02-09 | Disposition: A | Discharge status: Home / Self Care | Payer: BC Managed Care – PPO | Source: Ambulatory Visit | Attending: Radiation Oncology | Admitting: Radiation Oncology

## 2023-02-09 DIAGNOSIS — Z510 Encounter for antineoplastic radiation therapy: Secondary | ICD-10-CM | POA: Diagnosis not present

## 2023-02-09 LAB — RAD ONC ARIA SESSION SUMMARY
Course Elapsed Days: 2
Plan Fractions Treated to Date: 3
Plan Prescribed Dose Per Fraction: 2.67 Gy
Plan Total Fractions Prescribed: 15
Plan Total Prescribed Dose: 40.05 Gy
Reference Point Dosage Given to Date: 8.01 Gy
Reference Point Session Dosage Given: 2.67 Gy
Session Number: 3

## 2023-02-10 ENCOUNTER — Other Ambulatory Visit: Payer: Self-pay

## 2023-02-10 ENCOUNTER — Ambulatory Visit
Admission: RE | Admit: 2023-02-10 | Discharge: 2023-02-10 | Disposition: A | Discharge status: Home / Self Care | Payer: BC Managed Care – PPO | Source: Ambulatory Visit | Attending: Radiation Oncology | Admitting: Radiation Oncology

## 2023-02-10 DIAGNOSIS — Z510 Encounter for antineoplastic radiation therapy: Secondary | ICD-10-CM | POA: Diagnosis not present

## 2023-02-10 LAB — RAD ONC ARIA SESSION SUMMARY
Course Elapsed Days: 3
Plan Fractions Treated to Date: 4
Plan Prescribed Dose Per Fraction: 2.67 Gy
Plan Total Fractions Prescribed: 15
Plan Total Prescribed Dose: 40.05 Gy
Reference Point Dosage Given to Date: 10.68 Gy
Reference Point Session Dosage Given: 2.67 Gy
Session Number: 4

## 2023-02-13 ENCOUNTER — Ambulatory Visit: Payer: BC Managed Care – PPO

## 2023-02-14 ENCOUNTER — Ambulatory Visit
Admission: RE | Admit: 2023-02-14 | Discharge: 2023-02-14 | Disposition: A | Discharge status: Home / Self Care | Payer: BC Managed Care – PPO | Source: Ambulatory Visit | Attending: Radiation Oncology | Admitting: Radiation Oncology

## 2023-02-14 ENCOUNTER — Other Ambulatory Visit: Payer: Self-pay

## 2023-02-14 DIAGNOSIS — Z510 Encounter for antineoplastic radiation therapy: Secondary | ICD-10-CM | POA: Diagnosis not present

## 2023-02-14 LAB — RAD ONC ARIA SESSION SUMMARY
Course Elapsed Days: 7
Plan Fractions Treated to Date: 5
Plan Prescribed Dose Per Fraction: 2.67 Gy
Plan Total Fractions Prescribed: 15
Plan Total Prescribed Dose: 40.05 Gy
Reference Point Dosage Given to Date: 13.35 Gy
Reference Point Session Dosage Given: 2.67 Gy
Session Number: 5

## 2023-02-15 ENCOUNTER — Other Ambulatory Visit: Payer: Self-pay

## 2023-02-15 ENCOUNTER — Ambulatory Visit
Admission: RE | Admit: 2023-02-15 | Discharge: 2023-02-15 | Disposition: A | Discharge status: Home / Self Care | Payer: BC Managed Care – PPO | Source: Ambulatory Visit | Attending: Radiation Oncology | Admitting: Radiation Oncology

## 2023-02-15 DIAGNOSIS — Z510 Encounter for antineoplastic radiation therapy: Secondary | ICD-10-CM | POA: Diagnosis not present

## 2023-02-15 LAB — RAD ONC ARIA SESSION SUMMARY
Course Elapsed Days: 8
Plan Fractions Treated to Date: 6
Plan Prescribed Dose Per Fraction: 2.67 Gy
Plan Total Fractions Prescribed: 15
Plan Total Prescribed Dose: 40.05 Gy
Reference Point Dosage Given to Date: 16.02 Gy
Reference Point Session Dosage Given: 2.67 Gy
Session Number: 6

## 2023-02-16 ENCOUNTER — Other Ambulatory Visit: Payer: Self-pay

## 2023-02-16 ENCOUNTER — Ambulatory Visit
Admission: RE | Admit: 2023-02-16 | Discharge: 2023-02-16 | Disposition: A | Discharge status: Home / Self Care | Payer: BC Managed Care – PPO | Source: Ambulatory Visit | Attending: Radiation Oncology | Admitting: Radiation Oncology

## 2023-02-16 DIAGNOSIS — Z510 Encounter for antineoplastic radiation therapy: Secondary | ICD-10-CM | POA: Diagnosis not present

## 2023-02-16 LAB — RAD ONC ARIA SESSION SUMMARY
Course Elapsed Days: 9
Plan Fractions Treated to Date: 7
Plan Prescribed Dose Per Fraction: 2.67 Gy
Plan Total Fractions Prescribed: 15
Plan Total Prescribed Dose: 40.05 Gy
Reference Point Dosage Given to Date: 18.69 Gy
Reference Point Session Dosage Given: 2.67 Gy
Session Number: 7

## 2023-02-17 ENCOUNTER — Ambulatory Visit
Admission: RE | Admit: 2023-02-17 | Discharge: 2023-02-17 | Disposition: A | Discharge status: Home / Self Care | Payer: BC Managed Care – PPO | Source: Ambulatory Visit | Attending: Radiation Oncology | Admitting: Radiation Oncology

## 2023-02-17 ENCOUNTER — Other Ambulatory Visit: Payer: Self-pay

## 2023-02-17 DIAGNOSIS — Z510 Encounter for antineoplastic radiation therapy: Secondary | ICD-10-CM | POA: Diagnosis not present

## 2023-02-17 LAB — RAD ONC ARIA SESSION SUMMARY
Course Elapsed Days: 10
Plan Fractions Treated to Date: 8
Plan Prescribed Dose Per Fraction: 2.67 Gy
Plan Total Fractions Prescribed: 15
Plan Total Prescribed Dose: 40.05 Gy
Reference Point Dosage Given to Date: 21.36 Gy
Reference Point Session Dosage Given: 2.67 Gy
Session Number: 8

## 2023-02-20 ENCOUNTER — Other Ambulatory Visit: Payer: Self-pay

## 2023-02-20 ENCOUNTER — Ambulatory Visit
Admission: RE | Admit: 2023-02-20 | Discharge: 2023-02-20 | Disposition: A | Discharge status: Home / Self Care | Payer: BC Managed Care – PPO | Source: Ambulatory Visit | Attending: Radiation Oncology | Admitting: Radiation Oncology

## 2023-02-20 DIAGNOSIS — Z510 Encounter for antineoplastic radiation therapy: Secondary | ICD-10-CM | POA: Diagnosis not present

## 2023-02-20 LAB — RAD ONC ARIA SESSION SUMMARY
Course Elapsed Days: 13
Plan Fractions Treated to Date: 9
Plan Prescribed Dose Per Fraction: 2.67 Gy
Plan Total Fractions Prescribed: 15
Plan Total Prescribed Dose: 40.05 Gy
Reference Point Dosage Given to Date: 24.03 Gy
Reference Point Session Dosage Given: 2.67 Gy
Session Number: 9

## 2023-02-21 ENCOUNTER — Ambulatory Visit
Admission: RE | Admit: 2023-02-21 | Discharge: 2023-02-21 | Disposition: A | Discharge status: Home / Self Care | Payer: BC Managed Care – PPO | Source: Ambulatory Visit | Attending: Radiation Oncology | Admitting: Radiation Oncology

## 2023-02-21 ENCOUNTER — Ambulatory Visit: Payer: BC Managed Care – PPO

## 2023-02-21 ENCOUNTER — Other Ambulatory Visit: Payer: Self-pay

## 2023-02-21 DIAGNOSIS — Z510 Encounter for antineoplastic radiation therapy: Secondary | ICD-10-CM | POA: Diagnosis not present

## 2023-02-21 LAB — RAD ONC ARIA SESSION SUMMARY
Course Elapsed Days: 14
Plan Fractions Treated to Date: 10
Plan Prescribed Dose Per Fraction: 2.67 Gy
Plan Total Fractions Prescribed: 15
Plan Total Prescribed Dose: 40.05 Gy
Reference Point Dosage Given to Date: 26.7 Gy
Reference Point Session Dosage Given: 2.67 Gy
Session Number: 10

## 2023-02-22 ENCOUNTER — Other Ambulatory Visit: Payer: Self-pay

## 2023-02-22 ENCOUNTER — Ambulatory Visit
Admission: RE | Admit: 2023-02-22 | Discharge: 2023-02-22 | Disposition: A | Discharge status: Home / Self Care | Payer: BC Managed Care – PPO | Source: Ambulatory Visit | Attending: Radiation Oncology | Admitting: Radiation Oncology

## 2023-02-22 DIAGNOSIS — Z510 Encounter for antineoplastic radiation therapy: Secondary | ICD-10-CM | POA: Diagnosis not present

## 2023-02-22 LAB — RAD ONC ARIA SESSION SUMMARY
Course Elapsed Days: 15
Plan Fractions Treated to Date: 11
Plan Prescribed Dose Per Fraction: 2.67 Gy
Plan Total Fractions Prescribed: 15
Plan Total Prescribed Dose: 40.05 Gy
Reference Point Dosage Given to Date: 29.37 Gy
Reference Point Session Dosage Given: 2.67 Gy
Session Number: 11

## 2023-02-23 ENCOUNTER — Ambulatory Visit
Admission: RE | Admit: 2023-02-23 | Discharge: 2023-02-23 | Disposition: A | Discharge status: Home / Self Care | Payer: BC Managed Care – PPO | Source: Ambulatory Visit | Attending: Radiation Oncology | Admitting: Radiation Oncology

## 2023-02-23 ENCOUNTER — Other Ambulatory Visit: Payer: Self-pay

## 2023-02-23 DIAGNOSIS — Z510 Encounter for antineoplastic radiation therapy: Secondary | ICD-10-CM | POA: Diagnosis not present

## 2023-02-23 LAB — RAD ONC ARIA SESSION SUMMARY
Course Elapsed Days: 16
Plan Fractions Treated to Date: 12
Plan Prescribed Dose Per Fraction: 2.67 Gy
Plan Total Fractions Prescribed: 15
Plan Total Prescribed Dose: 40.05 Gy
Reference Point Dosage Given to Date: 32.04 Gy
Reference Point Session Dosage Given: 2.67 Gy
Session Number: 12

## 2023-02-24 ENCOUNTER — Ambulatory Visit
Admission: RE | Admit: 2023-02-24 | Discharge: 2023-02-24 | Disposition: A | Discharge status: Home / Self Care | Payer: BC Managed Care – PPO | Source: Ambulatory Visit | Attending: Radiation Oncology | Admitting: Radiation Oncology

## 2023-02-24 ENCOUNTER — Other Ambulatory Visit: Payer: Self-pay

## 2023-02-24 DIAGNOSIS — Z510 Encounter for antineoplastic radiation therapy: Secondary | ICD-10-CM | POA: Diagnosis not present

## 2023-02-24 LAB — RAD ONC ARIA SESSION SUMMARY
Course Elapsed Days: 17
Plan Fractions Treated to Date: 13
Plan Prescribed Dose Per Fraction: 2.67 Gy
Plan Total Fractions Prescribed: 15
Plan Total Prescribed Dose: 40.05 Gy
Reference Point Dosage Given to Date: 34.71 Gy
Reference Point Session Dosage Given: 2.67 Gy
Session Number: 13

## 2023-02-27 ENCOUNTER — Ambulatory Visit
Admission: RE | Admit: 2023-02-27 | Discharge: 2023-02-27 | Disposition: A | Discharge status: Home / Self Care | Payer: BC Managed Care – PPO | Source: Ambulatory Visit | Attending: Radiation Oncology | Admitting: Radiation Oncology

## 2023-02-27 ENCOUNTER — Ambulatory Visit: Payer: BC Managed Care – PPO

## 2023-02-27 ENCOUNTER — Other Ambulatory Visit: Payer: Self-pay

## 2023-02-27 DIAGNOSIS — Z510 Encounter for antineoplastic radiation therapy: Secondary | ICD-10-CM | POA: Diagnosis not present

## 2023-02-27 LAB — RAD ONC ARIA SESSION SUMMARY
Course Elapsed Days: 20
Plan Fractions Treated to Date: 14
Plan Prescribed Dose Per Fraction: 2.67 Gy
Plan Total Fractions Prescribed: 15
Plan Total Prescribed Dose: 40.05 Gy
Reference Point Dosage Given to Date: 37.38 Gy
Reference Point Session Dosage Given: 2.67 Gy
Session Number: 14

## 2023-02-27 NOTE — Progress Notes (Signed)
CARDIO-ONCOLOGY CLINIC  NOTE  Referring Physician: Dr. Chryl Heck Primary Care: [name], MD Primary Cardiologist: New  HPI:  Sydney Lee is a 62 y.o. female with DM2, HTN, smoker (quit 1 month ago) left breast cancer referred by Dr. Chryl Heck for enrollment into the Cardio-Oncology program.  Diagnosed in 5/23. Stage IIB triple negative. Ki-67 80%  7/3- 10/07/22 treated with Pembrolizumab (200) D1 + Carboplatin (5) D1 + Paclitaxel (80) D1,8,15 q21d X 4 cycles  followed by Pembrolizumab (200) D1 + AC D1 q21d x 4 cycles (received 2 cycles of AC including adriamycin)   After cycle 5 D1, she was admitted (09/15/22-09/21/22) with streptococcal pneumonia infection/sepsis and was on antibiotics, required transfusion, felt severe fatigue, debilitated after.    Chest CT 09/17/22: No coronary calcifications  Denies any h/o known heart disease. Had DM2 and HTN but well controlled.  Worked as Librarian, academic at [employer]   I saw her for first visit in 12/23 due to decreased EF. cMRI ordered.   cMRI 12/23: LVEF 49% RVEF 49% No LGE. Diffuse elevation in extracellular volume percentage at 37%. This suggests diffusely increased myocardial fibrosis. Lack of LGE and ECV< 40% make cardiac amyloidosis unlikely. -> D/w Dr. Chryl Heck. - Given evidence of probable previous myocarditis decision made toavoid further Keytruda and adriamycin and proceed with adjuvant XRT and not immunotherapy   Here for f/u.   ECHO 06/02/22 EF 55-60% GLS -22.4% ECHO: 10/11/22  read as 30-35%. I reviewed echo and feel like EF closer to 40-45%  Past Medical History:  Diagnosis Date   Anxiety    Arthritis    Breast cancer ([place])    Cardiomyopathy ([place]) 10/2022   likley chemotherapy/adriamycin induced cardiomyopathy   Depression    Diabetes mellitus without complication ([place])    type 2   Hypertension    Neuromuscular disorder ([facility])    neuropathy hands/feets    Current Outpatient Medications  Medication Sig Dispense Refill   acetaminophen  (TYLENOL) 500 MG tablet Take 1,000 mg by mouth every 6 (six) hours as needed for moderate pain.     ALPRAZolam (XANAX) 0.25 MG tablet Take 1 tablet (0.25 mg total) by mouth 2 (two) times daily as needed for anxiety. 30 tablet 0   atorvastatin (LIPITOR) 10 MG tablet Take 10 mg by mouth every Monday, Wednesday, and Friday.     DULoxetine (CYMBALTA) 30 MG capsule Take 30 mg by mouth 2 (two) times daily.     JARDIANCE 10 MG TABS tablet Take 10 mg by mouth in the morning.     lidocaine-prilocaine (EMLA) cream Apply to affected area once (Patient taking differently: Apply 1 Application topically daily as needed (prior to port access).) 30 g 3   losartan (COZAAR) 100 MG tablet Take 100 mg by mouth in the morning.     meclizine (ANTIVERT) 25 MG tablet Take 25 mg by mouth 3 (three) times daily as needed for dizziness.     metFORMIN (GLUCOPHAGE) 1000 MG tablet Take 1,000 mg by mouth in the morning and at bedtime.     metoprolol succinate (TOPROL XL) 50 MG 24 hr tablet Take 1 tablet (50 mg total) by mouth at bedtime. Take with or immediately following a meal. 30 tablet 5   ondansetron (ZOFRAN) 8 MG tablet Take 1 tablet (8 mg total) by mouth 2 (two) times daily as needed. Start on the third day after carboplatin and AC chemotherapy. (Patient not taking: Reported on 12/06/2022) 30 tablet 1   oxyCODONE (OXY IR/ROXICODONE) 5 MG immediate  release tablet Take 1 tablet (5 mg total) by mouth every 6 (six) hours as needed for severe pain. (Patient not taking: Reported on 01/16/2023) 5 tablet 0   OZEMPIC, 1 MG/DOSE, 4 MG/3ML SOPN Inject 1 mg into the skin every Wednesday.     predniSONE (DELTASONE) 2.5 MG tablet Take 1 tablet (2.5 mg total) by mouth daily with breakfast. (Patient not taking: Reported on 01/16/2023) 15 tablet 0   prochlorperazine (COMPAZINE) 10 MG tablet Take 1 tablet (10 mg total) by mouth every 6 (six) hours as needed (Nausea or vomiting). (Patient not taking: Reported on 12/06/2022) 30 tablet 1   No current  facility-administered medications for this encounter.    Allergies  Allergen Reactions   Emend [Aprepitant] Shortness Of Breath and Other (See Comments)    Flushing and shortness of breath    Codeine Nausea Only and Other (See Comments)    "Sick on the stomach"   Latex Itching and Other (See Comments)    Gloves=itching/burning      Social History   Socioeconomic History   Marital status: Married    Spouse name: Not on file   Number of children: Not on file   Years of education: Not on file   Highest education level: Not on file  Occupational History   Not on file  Tobacco Use   Smoking status: Former    Packs/day: 1.00    Years: 40.00    Additional pack years: 0.00    Total pack years: 40.00    Types: Cigarettes    Quit date: 06/05/2022    Years since quitting: 0.7   Smokeless tobacco: Never  Vaping Use   Vaping Use: Never used  Substance and Sexual Activity   Alcohol use: No   Drug use: No   Sexual activity: Not on file  Other Topics Concern   Not on file  Social History Narrative   Not on file   Social Determinants of Health   Financial Resource Strain: High Risk (06/14/2022)   Overall Financial Resource Strain (CARDIA)    Difficulty of Paying Living Expenses: Hard  Food Insecurity: No Food Insecurity (01/16/2023)   Hunger Vital Sign    Worried About Running Out of Food in the Last Year: Never true    Ran Out of Food in the Last Year: Never true  Transportation Needs: No Transportation Needs (01/16/2023)   PRAPARE - Hydrologist (Medical): No    Lack of Transportation (Non-Medical): No  Physical Activity: Not on file  Stress: Not on file  Social Connections: Not on file  Intimate Partner Violence: Not At Risk (01/16/2023)   Humiliation, Afraid, Rape, and Kick questionnaire    Fear of Current or Ex-Partner: No    Emotionally Abused: No    Physically Abused: No    Sexually Abused: No      Family History  Problem Relation Age  of Onset   Leukemia Mother 62   Throat cancer Maternal Uncle 85   Prostate cancer Maternal Uncle    Breast cancer Cousin 35       maternal first cousin   Ovarian cancer Cousin        maternal first cousin   Breast cancer Cousin 43 - 54       paternal first cousin   Breast cancer Cousin 66 - 6       paternal first cousin    There were no vitals filed for this visit.   PHYSICAL  EXAM: General:  Well appearing. No resp difficulty HEENT: normal Neck: supple. no JVD. Carotids 2+ bilat; no bruits. No lymphadenopathy or thryomegaly appreciated. Cor: PMI nondisplaced. Regular rate & rhythm. No rubs, gallops or murmurs. Lungs: clear Abdomen: soft, nontender, nondistended. No hepatosplenomegaly. No bruits or masses. Good bowel sounds. Extremities: no cyanosis, clubbing, rash, edema Neuro: alert & orientedx3, cranial nerves grossly intact. moves all 4 extremities w/o difficulty. Affect pleasant    ASSESSMENT & PLAN:  1. Left Breast Cancer - 7/3- 10/07/22 treated with Pembrolizumab (200) D1 + Carboplatin (5) D1 + Paclitaxel (80) D1,8,15 q21d X 4 cycles  followed by Pembrolizumab (200) D1 + AC D1 q21d x 4 cycles (received 2 cycles of AC including adriamycin) - Has completed therapy. Plan as below  2. Chemotherapy-induced cardiomyopathy - ECHO 06/02/22 EF 55-60% GLS -22.4% - ECHO11/7/23  read as 30-35%. I reviewed echo and feel like EF closer to 40-45% - cMRI 12/23: LVEF 49% RVEF 49% No LGE. Diffuse elevation in extracellular volume percentage at 37%. This suggests diffusely increased myocardial fibrosis. Lack of LGE and ECV< 40% make cardiac amyloidosis unlikely. ? 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 Plan is now to proceed with  adjuvant XRT and not immunotherapy  3. DM2 - on Ozempic and Jardiance    Glori Bickers, MD  11:15 PM

## 2023-02-28 ENCOUNTER — Other Ambulatory Visit: Payer: Self-pay

## 2023-02-28 ENCOUNTER — Encounter (HOSPITAL_COMMUNITY): Payer: Self-pay | Admitting: Internal Medicine

## 2023-02-28 ENCOUNTER — Ambulatory Visit: Payer: BC Managed Care – PPO

## 2023-02-28 ENCOUNTER — Ambulatory Visit (HOSPITAL_COMMUNITY)
Admission: RE | Admit: 2023-02-28 | Discharge: 2023-02-28 | Disposition: A | Discharge status: Home / Self Care | Payer: BC Managed Care – PPO | Source: Ambulatory Visit | Attending: Internal Medicine | Admitting: Internal Medicine

## 2023-02-28 ENCOUNTER — Ambulatory Visit
Admission: RE | Admit: 2023-02-28 | Discharge: 2023-02-28 | Disposition: A | Discharge status: Home / Self Care | Payer: BC Managed Care – PPO | Source: Ambulatory Visit | Attending: Radiation Oncology | Admitting: Radiation Oncology

## 2023-02-28 VITALS — BP 92/60 | HR 72 | Wt 198.6 lb

## 2023-02-28 DIAGNOSIS — C50212 Malignant neoplasm of upper-inner quadrant of left female breast: Secondary | ICD-10-CM | POA: Insufficient documentation

## 2023-02-28 DIAGNOSIS — I10 Essential (primary) hypertension: Secondary | ICD-10-CM | POA: Insufficient documentation

## 2023-02-28 DIAGNOSIS — T451X5A Adverse effect of antineoplastic and immunosuppressive drugs, initial encounter: Secondary | ICD-10-CM | POA: Diagnosis not present

## 2023-02-28 DIAGNOSIS — Z510 Encounter for antineoplastic radiation therapy: Secondary | ICD-10-CM | POA: Insufficient documentation

## 2023-02-28 DIAGNOSIS — Z7984 Long term (current) use of oral hypoglycemic drugs: Secondary | ICD-10-CM | POA: Diagnosis not present

## 2023-02-28 DIAGNOSIS — E119 Type 2 diabetes mellitus without complications: Secondary | ICD-10-CM | POA: Diagnosis not present

## 2023-02-28 DIAGNOSIS — I427 Cardiomyopathy due to drug and external agent: Secondary | ICD-10-CM | POA: Diagnosis not present

## 2023-02-28 DIAGNOSIS — Z7985 Long-term (current) use of injectable non-insulin antidiabetic drugs: Secondary | ICD-10-CM | POA: Diagnosis not present

## 2023-02-28 DIAGNOSIS — Z171 Estrogen receptor negative status [ER-]: Secondary | ICD-10-CM | POA: Insufficient documentation

## 2023-02-28 LAB — RAD ONC ARIA SESSION SUMMARY
Course Elapsed Days: 21
Plan Fractions Treated to Date: 15
Plan Prescribed Dose Per Fraction: 2.67 Gy
Plan Total Fractions Prescribed: 15
Plan Total Prescribed Dose: 40.05 Gy
Reference Point Dosage Given to Date: 40.05 Gy
Reference Point Session Dosage Given: 2.67 Gy
Session Number: 15

## 2023-02-28 MED ORDER — LOSARTAN POTASSIUM 100 MG PO TABS: 50.0000 mg | ORAL_TABLET | Freq: Every morning | ORAL | Status: DC

## 2023-02-28 NOTE — Addendum Note (Signed)
Encounter addended by: Scarlette Calico, RN on: 02/28/2023 10:24 AM  Actions taken: Diagnosis association updated, Order list changed, Clinical Note Signed

## 2023-02-28 NOTE — Patient Instructions (Signed)
Medication Changes:  DECREASE Losartan to 50 mg (1/2 tab) Daily  Lab Work:  none  Testing/Procedures:  Your physician has requested that you have an echocardiogram. Echocardiography is a painless test that uses sound waves to create images of your heart. It provides your doctor with information about the size and shape of your heart and how well your heart's chambers and valves are working. This procedure takes approximately one hour. There are no restrictions for this procedure. Please do NOT wear cologne, perfume, aftershave, or lotions (deodorant is allowed). Please arrive 15 minutes prior to your appointment time.   Referrals:  none  Special Instructions // Education:  Do the following things EVERYDAY: Weigh yourself in the morning before breakfast. Write it down and keep it in a log. Take your medicines as prescribed Eat low salt foods--Limit salt (sodium) to 2000 mg per day.  Stay as active as you can everyday Limit all fluids for the day to less than 2 liters  Follow-Up in: 6 months (Sept 2024), **PLEASE CALL OUR OFFICE IN JULY TO SCHEDULE THIS APPOINTMENT   At the [facility], you and your health needs are our priority. We have a designated team specialized in the treatment of Heart Failure. This Care Team includes your primary Heart Failure Specialized Cardiologist (physician), Advanced Practice Providers (APPs- Physician Assistants and Nurse Practitioners), and Pharmacist who all work together to provide you with the care you need, when you need it.   You may see any of the following providers on your designated Care Team at your next follow up:  Dr. Glori Bickers Dr. Loralie Champagne Dr. Roxana Hires, NP Lyda Jester, [place] [facility] [place], [place] Forestine Na, NP Audry Riles, PharmD   Please be sure to bring in all your medications bottles to every appointment.   Need to Contact us:  If you have any questions or  concerns before your next appointment please send [place] a message through [place] or call our office at [phone].    TO LEAVE A MESSAGE FOR THE NURSE SELECT OPTION 2, PLEASE LEAVE A MESSAGE INCLUDING: YOUR NAME DATE OF BIRTH CALL BACK NUMBER REASON FOR CALL**this is important as we prioritize the call backs  YOU WILL RECEIVE A CALL BACK THE SAME DAY AS LONG AS YOU CALL BEFORE 4:00 PM

## 2023-03-01 ENCOUNTER — Ambulatory Visit: Payer: BC Managed Care – PPO

## 2023-03-01 ENCOUNTER — Other Ambulatory Visit: Payer: Self-pay

## 2023-03-01 DIAGNOSIS — Z510 Encounter for antineoplastic radiation therapy: Secondary | ICD-10-CM | POA: Diagnosis not present

## 2023-03-01 LAB — RAD ONC ARIA SESSION SUMMARY
Course Elapsed Days: 22
Plan Fractions Treated to Date: 1
Plan Prescribed Dose Per Fraction: 2 Gy
Plan Total Fractions Prescribed: 5
Plan Total Prescribed Dose: 10 Gy
Reference Point Dosage Given to Date: 2 Gy
Reference Point Session Dosage Given: 2 Gy
Session Number: 16

## 2023-03-02 ENCOUNTER — Ambulatory Visit
Admission: RE | Admit: 2023-03-02 | Discharge: 2023-03-02 | Disposition: A | Discharge status: Home / Self Care | Payer: BC Managed Care – PPO | Source: Ambulatory Visit | Attending: Radiation Oncology | Admitting: Radiation Oncology

## 2023-03-02 ENCOUNTER — Other Ambulatory Visit: Payer: Self-pay

## 2023-03-02 ENCOUNTER — Telehealth: Payer: Self-pay | Admitting: *Deleted

## 2023-03-02 DIAGNOSIS — Z510 Encounter for antineoplastic radiation therapy: Secondary | ICD-10-CM | POA: Diagnosis not present

## 2023-03-02 LAB — RAD ONC ARIA SESSION SUMMARY
Course Elapsed Days: 23
Plan Fractions Treated to Date: 2
Plan Prescribed Dose Per Fraction: 2 Gy
Plan Total Fractions Prescribed: 5
Plan Total Prescribed Dose: 10 Gy
Reference Point Dosage Given to Date: 4 Gy
Reference Point Session Dosage Given: 2 Gy
Session Number: 17

## 2023-03-03 ENCOUNTER — Ambulatory Visit: Payer: BC Managed Care – PPO

## 2023-03-03 ENCOUNTER — Ambulatory Visit
Admission: RE | Admit: 2023-03-03 | Discharge: 2023-03-03 | Disposition: A | Discharge status: Home / Self Care | Payer: BC Managed Care – PPO | Source: Ambulatory Visit | Attending: Radiation Oncology | Admitting: Radiation Oncology

## 2023-03-03 ENCOUNTER — Other Ambulatory Visit: Payer: Self-pay

## 2023-03-03 DIAGNOSIS — Z510 Encounter for antineoplastic radiation therapy: Secondary | ICD-10-CM | POA: Diagnosis not present

## 2023-03-03 LAB — RAD ONC ARIA SESSION SUMMARY
Course Elapsed Days: 24
Plan Fractions Treated to Date: 3
Plan Prescribed Dose Per Fraction: 2 Gy
Plan Total Fractions Prescribed: 5
Plan Total Prescribed Dose: 10 Gy
Reference Point Dosage Given to Date: 6 Gy
Reference Point Session Dosage Given: 2 Gy
Session Number: 18

## 2023-03-06 ENCOUNTER — Ambulatory Visit
Admission: RE | Admit: 2023-03-06 | Discharge: 2023-03-06 | Disposition: A | Discharge status: Home / Self Care | Payer: BC Managed Care – PPO | Source: Ambulatory Visit | Attending: Radiation Oncology | Admitting: Radiation Oncology

## 2023-03-06 ENCOUNTER — Other Ambulatory Visit: Payer: Self-pay

## 2023-03-06 ENCOUNTER — Ambulatory Visit: Payer: BC Managed Care – PPO

## 2023-03-06 ENCOUNTER — Encounter: Payer: Self-pay | Admitting: *Deleted

## 2023-03-06 DIAGNOSIS — Z510 Encounter for antineoplastic radiation therapy: Secondary | ICD-10-CM | POA: Diagnosis present

## 2023-03-06 DIAGNOSIS — Z171 Estrogen receptor negative status [ER-]: Secondary | ICD-10-CM

## 2023-03-06 DIAGNOSIS — C50212 Malignant neoplasm of upper-inner quadrant of left female breast: Secondary | ICD-10-CM | POA: Insufficient documentation

## 2023-03-06 LAB — RAD ONC ARIA SESSION SUMMARY
Course Elapsed Days: 27
Plan Fractions Treated to Date: 4
Plan Prescribed Dose Per Fraction: 2 Gy
Plan Total Fractions Prescribed: 5
Plan Total Prescribed Dose: 10 Gy
Reference Point Dosage Given to Date: 8 Gy
Reference Point Session Dosage Given: 2 Gy
Session Number: 19

## 2023-03-07 ENCOUNTER — Other Ambulatory Visit: Payer: Self-pay

## 2023-03-07 ENCOUNTER — Ambulatory Visit
Admission: RE | Admit: 2023-03-07 | Discharge: 2023-03-07 | Disposition: A | Discharge status: Home / Self Care | Payer: BC Managed Care – PPO | Source: Ambulatory Visit | Attending: Radiation Oncology | Admitting: Radiation Oncology

## 2023-03-07 DIAGNOSIS — Z510 Encounter for antineoplastic radiation therapy: Secondary | ICD-10-CM | POA: Diagnosis not present

## 2023-03-07 LAB — RAD ONC ARIA SESSION SUMMARY
Course Elapsed Days: 28
Plan Fractions Treated to Date: 5
Plan Prescribed Dose Per Fraction: 2 Gy
Plan Total Fractions Prescribed: 5
Plan Total Prescribed Dose: 10 Gy
Reference Point Dosage Given to Date: 10 Gy
Reference Point Session Dosage Given: 2 Gy
Session Number: 20

## 2023-03-09 ENCOUNTER — Other Ambulatory Visit: Payer: Self-pay

## 2023-03-09 ENCOUNTER — Inpatient Hospital Stay: Payer: BC Managed Care – PPO | Attending: Physician Assistant | Admitting: Hematology and Oncology

## 2023-03-09 ENCOUNTER — Telehealth: Payer: Self-pay | Admitting: *Deleted

## 2023-03-09 VITALS — BP 105/63 | HR 81 | Temp 98.3°F | Resp 13 | Wt 200.9 lb

## 2023-03-09 DIAGNOSIS — Z171 Estrogen receptor negative status [ER-]: Secondary | ICD-10-CM

## 2023-03-09 DIAGNOSIS — I514 Myocarditis, unspecified: Secondary | ICD-10-CM | POA: Insufficient documentation

## 2023-03-09 DIAGNOSIS — Z923 Personal history of irradiation: Secondary | ICD-10-CM | POA: Diagnosis not present

## 2023-03-09 DIAGNOSIS — C50212 Malignant neoplasm of upper-inner quadrant of left female breast: Secondary | ICD-10-CM

## 2023-03-09 NOTE — Telephone Encounter (Signed)
Per [facility] receptionist Nicki Guadalajara is in office requesting to speak with forms staff.  This nurse met with Nicki Guadalajara regarding [place] form information.  Reviewed claim instructions.  Provided Patient Accounting [phone]) and [facility] ([facility] [phone]) numbers to request billing and records.  Obtained signed ROI.  Advised of delay of expected 7-10 business days 14-calendar processing time frame.  Form staff currently experiencing back-log of 3-4 weeks.  Request call for pick up to return packet of all information to [employer].  Currently no further questions or needs.

## 2023-03-09 NOTE — Assessment & Plan Note (Addendum)
This is a very pleasant 62 year old female patient with newly diagnosed self detected left breast mass status post imaging and biopsy with pathology showing invasive poorly differentiated adenocarcinoma, grade 3, triple negative, high proliferation index of 80% presented in the breast [place] for additional recommendations.   Given triple negative tumor larger than 2 cm at diagnosis, we have discussed about considering neoadjuvant chemotherapy.   She completed neoadjuvant portion of CarboTaxol Keytruda followed by 1 cycle of AC with Keytruda.   She then complained of persistent fatigue and hence we have ordered a repeat echo and some other workup which showed a significant drop in her ejection fraction.  She was thought to have cardiac toxicity secondary to anthracyclines versus Keytruda and hence referred to cardiac oncology.   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  After much discussion and reconsultation with Dr. Haroldine Laws from cardiac oncology, given complete pathologic response and concern for further cardiac toxicity we have made a decision to eliminate adjuvant immunotherapy.  She will now proceed with adjuvant radiation.  No role for antiestrogen therapy. She is slowly healing from radiation.  Fatigue and neuropathy have been improving.  At this time there is no concern for breast cancer recurrence.  I recommended that she move forward with mammogram in June 2024, this has been ordered.  She was encouraged to do self breast exam and report any changes for [place].  She will return to clinic in about 6 months or sooner as needed.

## 2023-03-09 NOTE — Radiation Completion Notes (Signed)
Patient Name: Sydney Lee, Sydney Lee MRN: [id number] Date of Birth: 1961/11/01 Referring Physician: [name], M.D. Date of Service: 2023-03-09 Radiation Oncologist: Teryl Lucy, M.D. 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 Staging on 2022-05-25: Malignant neoplasm of upper-inner quadrant of left breast in female, estrogen receptor negative T=cT2, N=cN0, M=cM0 Intent: Curative     ==========DELIVERED PLANS==========  First Treatment Date: 2023-02-07 - Last Treatment Date: 2023-03-07   Plan Name: Breast_L_BH Site: Breast, Left Technique: 3D Mode: Photon Dose Per Fraction: 2.67 Gy Prescribed Dose (Delivered / Prescribed): 40.05 Gy / 40.05 Gy Prescribed Fxs (Delivered / Prescribed): 15 / 15   Plan Name: Brst_L_Bst_BH Site: Breast, Left Technique: 3D Mode: Photon Dose Per Fraction: 2 Gy Prescribed Dose (Delivered / Prescribed): 10 Gy / 10 Gy Prescribed Fxs (Delivered / Prescribed): 5 / 5     ==========ON TREATMENT VISIT DATES========== 2023-02-07, 2023-02-14, 2023-02-21, 2023-02-28, 2023-03-07     ==========UPCOMING VISITS==========       ==========APPENDIX - ON TREATMENT VISIT NOTES==========   See weekly On Treatment Notes is Epic for details.

## 2023-03-09 NOTE — Progress Notes (Signed)
[facility] Follow up:    Sydney Lee, [address] [place] [address]   DIAGNOSIS:  Cancer Staging  Malignant neoplasm of upper-inner quadrant of left breast in female, estrogen receptor negative Staging form: Breast, AJCC 8th Edition - Clinical: Stage IIB (cT2, cN0, cM0, G3, ER-, PR-, HER2-) - Signed by Sydney Pike, MD on 05/25/2022 Stage prefix: Initial diagnosis Histologic grading system: 3 grade system   Malignant neoplasm of upper-inner quadrant of left breast in female, estrogen receptor negative (Sydney Lee) This is a very pleasant 62 year old female patient with newly diagnosed self detected left breast mass status post imaging and biopsy with pathology showing invasive poorly differentiated adenocarcinoma, grade 3, triple negative, high proliferation index of 80% presented in the breast Sydney Lee for additional recommendations.   Given triple negative tumor larger than 2 cm at diagnosis, we have discussed about considering neoadjuvant chemotherapy.   She completed neoadjuvant portion of CarboTaxol Keytruda followed by 1 cycle of AC with Keytruda.   She then complained of persistent fatigue and hence we have ordered a repeat echo and some other workup which showed a significant drop in her ejection fraction.  She was thought to have cardiac toxicity secondary to anthracyclines versus Keytruda and hence referred to cardiac oncology.   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  After much discussion and reconsultation with Dr. Haroldine Lee from cardiac oncology, given complete pathologic response and concern for further cardiac toxicity we have made a decision to eliminate adjuvant immunotherapy.  She will now proceed with adjuvant radiation.  No role for antiestrogen therapy. She is slowly healing from radiation.  Fatigue and neuropathy have been improving.  At this time there is no concern for breast cancer  recurrence.  I recommended that she move forward with mammogram in June 2024, this has been ordered.  She was encouraged to do self breast exam and report any changes for [place].  She will return to clinic in about 6 months or sooner as needed.   SUMMARY OF ONCOLOGIC HISTORY: Oncology History  Malignant neoplasm of upper-inner quadrant of left breast in female, estrogen receptor negative  04/27/2022 Mammogram   Diagnostic mammogram showed indeterminate solid mass in the 10:00 location of the left breast.  Small satellite nodule adjacent to the index mass.  Borderline lymph node has cortical thickening of 2.9 mm.  Other lymph nodes have normal morphology.   05/16/2022 Pathology Results   Left breast needle core biopsy showed invasive poorly differentiated adenocarcinoma, grade 3 with tumor necrosis, lymph node biopsy benign reactive, negative for carcinoma.  Prognostics from the tumor showed ER 0%, negative, PR 0%, negative, Ki-67 of 80% and HER2 negative   05/25/2022 Cancer Staging   Staging form: Breast, AJCC 8th Edition - Clinical: Stage IIB (cT2, cN0, cM0, G3, ER-, PR-, HER2-) - Signed by Sydney Pike, MD on 05/25/2022 Stage prefix: Initial diagnosis Histologic grading system: 3 grade system   05/25/2022 Genetic Testing   [facility] CancerNext-Expanded Panel was Negative. Report date was 06/02/2022.  The CancerNext-Expanded gene panel offered by [facility] and includes sequencing, rearrangement, and RNA analysis for the following 77 genes: AIP, ALK, APC, ATM, AXIN2, BAP1, BARD1, BLM, BMPR1A, BRCA1, BRCA2, BRIP1, CDC73, CDH1, CDK4, CDKN1B, CDKN2A, CHEK2, CTNNA1, DICER1, FANCC, FH, FLCN, GALNT12, KIF1B, LZTR1, MAX, MEN1, MET, MLH1, MSH2, MSH3, MSH6, MUTYH, NBN, NF1, NF2, NTHL1, PALB2, PHOX2B, PMS2, POT1, PRKAR1A, PTCH1, PTEN, RAD51C, RAD51D, RB1, RECQL, RET, SDHA,  SDHAF2, SDHB, SDHC, SDHD, SMAD4, SMARCA4, SMARCB1, SMARCE1, STK11, SUFU, TMEM127, TP53, TSC1, TSC2, VHL and XRCC2 (sequencing and  deletion/duplication); EGFR, EGLN1, HOXB13, KIT, MITF, PDGFRA, POLD1, and POLE (sequencing only); EPCAM and GREM1 (deletion/duplication only).    06/06/2022 - 10/07/2022 Chemotherapy   Patient is on Treatment Plan : BREAST Pembrolizumab (200) D1 + Carboplatin (5) D1 + Paclitaxel (80) D1,8,15 q21d X 4 cycles / Pembrolizumab (200) D1 + AC D1 q21d x 4 cycles     12/15/2022 Definitive Surgery   She had left breast lumpectomy on January 11 which showed no residual carcinoma in situ or invasive carcinoma identified ypT0 all margins negative.  Sentinel lymph nodes 5 out of 5 without any involvement for carcinoma.    CURRENT THERAPY: Taxol/Carbo/Keytruda  INTERVAL HISTORY:  Sydney Lee 62 y.o. female returns for follow-up and evaluation. She completed adjuvant radiation this past Tuesday. She feels well. She denies any new complaints. Fatigue still comes and goes. Neuropathy, some days are better than other. Appetite is good. No chest pain, chest pressure, SOB. No change in bowel habits or urinary habits.  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   Fatigue due to treatment 10/13/2022   Malnutrition of moderate degree 09/17/2022   Severe sepsis with acute organ dysfunction 09/15/2022   HTN (hypertension) 09/15/2022   Diabetes mellitus, type II 09/15/2022   Anxiety 09/15/2022   Depression 09/15/2022   Port-A-Cath in place 06/06/2022   Infusion reaction 06/06/2022   Genetic testing 06/03/2022   Family history of breast cancer 05/25/2022   Family history of ovarian cancer 05/25/2022   Malignant neoplasm of upper-inner quadrant of left breast in female, estrogen receptor negative 05/23/2022    is allergic to [facility] [aprepitant], codeine, and latex.  MEDICAL HISTORY: Past Medical History:  Diagnosis Date   Anxiety     Arthritis    Breast cancer ([place])    Cardiomyopathy ([place]) 10/2022   likley chemotherapy/adriamycin induced cardiomyopathy   Depression    Diabetes mellitus without complication ([place])    type 2   Hypertension    Neuromuscular disorder ([facility])    neuropathy hands/feets    SURGICAL HISTORY: Past Surgical History:  Procedure Laterality Date   BREAST BIOPSY  12/13/2022   MM LT RADIOACTIVE SEED LOC MAMMO GUIDE 12/13/2022 [facility] MAMMOGRAP[facility]   BREAST LUMPECTOMY WITH RADIOACTIVE SEED AND SENTINEL LYMPH NODE BIOPSY Left 12/15/2022   Procedure: LEFT BREAST LUMPECTOMY WITH RADIOACTIVE SEED AND SENTINEL LYMPH NODE BIOPSY;  Surgeon: Stark Klein, MD;  Location: [facility];  Service: General;  Laterality: Left;   PORTACATH PLACEMENT N/A 06/01/2022   Procedure: INSERTION PORT-A-CATH WITH ULTRASOUND GUIDANCE;  Surgeon: Stark Klein, MD;  Location: [facility];  Service: General;  Laterality: N/A;    SOCIAL HISTORY: Social History   Socioeconomic History   Marital status: Married    Spouse name: Not on file   Number of children: Not on file   Years of education: Not on file   Highest education level: Not on file  Occupational History   Not on file  Tobacco Use   Smoking status: Former    Packs/day: 1.00    Years: 40.00    Additional pack years: 0.00    Total pack years: 40.00    Types: Cigarettes    Quit date: 06/05/2022    Years since quitting: 0.7   Smokeless  tobacco: Never  Vaping Use   Vaping Use: Never used  Substance and Sexual Activity   Alcohol use: No   Drug use: No   Sexual activity: Not on file  Other Topics Concern   Not on file  Social History Narrative   Not on file   Social Determinants of Health   Financial Resource Strain: High Risk (06/14/2022)   Overall Financial Resource Strain (CARDIA)    Difficulty of Paying Living Expenses: Hard  Food Insecurity: No Food Insecurity (01/16/2023)   Hunger Vital Sign    Worried About Running Out of Food in the Last Year: Never true     Ran Out of Food in the Last Year: Never true  Transportation Needs: No Transportation Needs (01/16/2023)   PRAPARE - Hydrologist (Medical): No    Lack of Transportation (Non-Medical): No  Physical Activity: Not on file  Stress: Not on file  Social Connections: Not on file  Intimate Partner Violence: Not At Risk (01/16/2023)   Humiliation, Afraid, Rape, and Kick questionnaire    Fear of Current or Ex-Partner: No    Emotionally Abused: No    Physically Abused: No    Sexually Abused: No    FAMILY HISTORY: Family History  Problem Relation Age of Onset   Leukemia Mother 71   Throat cancer Maternal Uncle 65   Prostate cancer Maternal Uncle    Breast cancer Cousin 48       maternal first cousin   Ovarian cancer Cousin        maternal first cousin   Breast cancer Cousin 6 - 55       paternal first cousin   Breast cancer Cousin 59 - 33       paternal first cousin    Review of Systems  Constitutional:  Negative for appetite change, chills, fatigue, fever and unexpected weight change.  HENT:   Negative for hearing loss, lump/mass and trouble swallowing.   Eyes:  Negative for eye problems and icterus.  Respiratory:  Negative for chest tightness, cough and shortness of breath.   Cardiovascular:  Negative for chest pain, leg swelling and palpitations.  Gastrointestinal:  Negative for abdominal distention, abdominal pain, constipation, diarrhea, nausea and vomiting.  Endocrine: Negative for hot flashes.  Genitourinary:  Negative for difficulty urinating.   Musculoskeletal:  Negative for arthralgias.  Skin:  Negative for itching and rash.  Neurological:  Negative for dizziness, extremity weakness, headaches and numbness.  Hematological:  Negative for adenopathy. Does not bruise/bleed easily.  Psychiatric/Behavioral:  Negative for depression. The patient is not nervous/anxious.       PHYSICAL EXAMINATION  ECOG PERFORMANCE STATUS: 1 - Symptomatic but  completely ambulatory  Vitals:   03/09/23 1153  BP: 105/63  Pulse: 81  Resp: 13  Temp: 98.3 F (36.8 C)  SpO2: 100%   Physical Exam Constitutional:      Appearance: Normal appearance.  Cardiovascular:     Rate and Rhythm: Normal rate and regular rhythm.     Pulses: Normal pulses.     Heart sounds: Normal heart sounds.  Pulmonary:     Effort: Pulmonary effort is normal.     Breath sounds: Normal breath sounds.  Abdominal:     General: Abdomen is flat. Bowel sounds are normal.     Palpations: Abdomen is soft.  Musculoskeletal:     Cervical back: Normal range of motion and neck supple. No rigidity.  Lymphadenopathy:     Cervical: No cervical  adenopathy.  Skin:    General: Skin is warm and dry.     Coloration: Skin is not jaundiced.  Neurological:     General: No focal deficit present.     Mental Status: She is alert.  Psychiatric:        Mood and Affect: Mood normal.      LABORATORY DATA:  CBC    Component Value Date/Time   WBC 4.3 11/16/2022 1416   WBC 11.3 (H) 09/21/2022 0310   RBC 3.10 (L) 11/16/2022 1416   HGB 10.6 (L) 11/16/2022 1416   HCT 31.4 (L) 11/16/2022 1416   PLT 171 11/16/2022 1416   MCV 101.3 (H) 11/16/2022 1416   MCH 34.2 (H) 11/16/2022 1416   MCHC 33.8 11/16/2022 1416   RDW 15.1 11/16/2022 1416   LYMPHSABS 1.5 11/16/2022 1416   MONOABS 0.5 11/16/2022 1416   EOSABS 0.1 11/16/2022 1416   BASOSABS 0.0 11/16/2022 1416    CMP     Component Value Date/Time   NA 139 11/16/2022 1416   K 3.7 11/16/2022 1416   CL 103 11/16/2022 1416   CO2 28 11/16/2022 1416   GLUCOSE 98 11/16/2022 1416   BUN 12 11/16/2022 1416   CREATININE 0.59 11/16/2022 1416   CALCIUM 9.2 11/16/2022 1416   PROT 6.9 11/16/2022 1416   ALBUMIN 3.9 11/16/2022 1416   AST 12 (L) 11/16/2022 1416   ALT 9 11/16/2022 1416   ALKPHOS 60 11/16/2022 1416   BILITOT 0.5 11/16/2022 1416   GFRNONAA >60 11/16/2022 1416   GFRAA >60 12/22/2018 2150   Total time spent: 30 minutes  including history, physical exam, review of records, counseling and coordination of care   All questions were answered. The patient knows to call the clinic with any problems, questions or concerns. We can certainly see the patient much sooner if necessary.  Sydney Pike MD

## 2023-03-10 ENCOUNTER — Telehealth: Payer: Self-pay | Admitting: Hematology and Oncology

## 2023-03-10 NOTE — Telephone Encounter (Signed)
Spoke with patient confirming upcoming appointment  

## 2023-03-14 ENCOUNTER — Telehealth (HOSPITAL_COMMUNITY): Payer: Self-pay

## 2023-03-14 NOTE — Telephone Encounter (Signed)
Requested Documents faxed to [employer] on [phone]

## 2023-03-21 ENCOUNTER — Ambulatory Visit (HOSPITAL_COMMUNITY): Payer: BC Managed Care – PPO

## 2023-03-24 ENCOUNTER — Telehealth: Payer: Self-pay

## 2023-03-24 NOTE — Telephone Encounter (Signed)
Notified Patient of completion of Hospital Indemnity Form. Provided Patient with Telephone number for billing and [facility] Medical Records Department. Copy of forms placed for pick-up as requested by Patient. No other needs or concerns voiced at this time.

## 2023-03-30 ENCOUNTER — Ambulatory Visit (HOSPITAL_COMMUNITY)
Admission: RE | Admit: 2023-03-30 | Discharge: 2023-03-30 | Disposition: A | Discharge status: Home / Self Care | Payer: BC Managed Care – PPO | Source: Ambulatory Visit | Attending: Internal Medicine | Admitting: Internal Medicine

## 2023-03-30 DIAGNOSIS — E119 Type 2 diabetes mellitus without complications: Secondary | ICD-10-CM | POA: Diagnosis not present

## 2023-03-30 DIAGNOSIS — T451X5A Adverse effect of antineoplastic and immunosuppressive drugs, initial encounter: Secondary | ICD-10-CM

## 2023-03-30 DIAGNOSIS — I429 Cardiomyopathy, unspecified: Secondary | ICD-10-CM | POA: Insufficient documentation

## 2023-03-30 DIAGNOSIS — I10 Essential (primary) hypertension: Secondary | ICD-10-CM | POA: Diagnosis not present

## 2023-03-30 DIAGNOSIS — I427 Cardiomyopathy due to drug and external agent: Secondary | ICD-10-CM

## 2023-03-30 DIAGNOSIS — C50212 Malignant neoplasm of upper-inner quadrant of left female breast: Secondary | ICD-10-CM

## 2023-03-30 DIAGNOSIS — Z171 Estrogen receptor negative status [ER-]: Secondary | ICD-10-CM | POA: Diagnosis not present

## 2023-03-30 LAB — ECHOCARDIOGRAM COMPLETE
Area-P 1/2: 3.42 cm2
Calc EF: 59.5 %
S' Lateral: 3.2 cm
Single Plane A2C EF: 60.4 %
Single Plane A4C EF: 61.9 %

## 2023-03-30 NOTE — Progress Notes (Signed)
Echocardiogram 2D Echocardiogram has been performed.  Sydney Lee 03/30/2023, 11:08 AM

## 2023-03-31 ENCOUNTER — Other Ambulatory Visit: Payer: Self-pay | Admitting: Hematology and Oncology

## 2023-04-05 ENCOUNTER — Encounter: Payer: Self-pay | Admitting: Radiation Oncology

## 2023-04-06 ENCOUNTER — Telehealth: Payer: Self-pay | Admitting: *Deleted

## 2023-04-06 ENCOUNTER — Ambulatory Visit
Admission: RE | Admit: 2023-04-06 | Discharge: 2023-04-06 | Disposition: A | Discharge status: Home / Self Care | Payer: BC Managed Care – PPO | Source: Ambulatory Visit | Attending: Radiation Oncology | Admitting: Radiation Oncology

## 2023-04-06 HISTORY — DX: Personal history of irradiation: Z92.3

## 2023-04-06 NOTE — Telephone Encounter (Signed)
CALLED PATIENT TO ASK ABOUT RESCHEDULING MISSED FU FOR 04-06-23, PATIENT AGREED TO COME ON 04-10-23 @ 11:30 AM

## 2023-04-07 NOTE — Progress Notes (Signed)
Radiation Oncology         ([phone]) [phone] ________________________________  Name: [name] MRN: [id number]  Date: 04/10/2023  DOB: [birth year]  Follow-Up Visit Note  CC: [name], MD  Rachel Moulds, MD  No diagnosis found.  Diagnosis: No evidence of residual invasive or in-situ carcinoma in the left breast s/p neoadjuvant chemotherapy    Stage IIB (cT2, cN0, cM0) Left Breast UIQ, Invasive poorly differentiated ductal adenocarcinoma , ER- / PR- / Her2-, Grade 3      Interval Since Last Radiation: 1 month and 4 days   Indication for treatment: Curative       Radiation treatment dates: 02/07/23 through 03/07/23  Site/dose:  1) Left breast - treated with 40.05 Gy delivered in 15 Fx at 2.67 Gy/Fx 2) Left Breast Boost - treated with 10 Gy in 5 Fx at 2 Gy/Fx Beams/energy: 10X-FFF    Narrative:  The patient returns today for routine follow-up. The patient tolerated radiation treatment relatively well. On the date of her final treatment, the patient reported having some mild skin irritation. She denied any other side effects or concerns from treatment. Physical exam performed on the date of her final treatment showed hyperpigmentation changes throughout the left breast. No skin breakdown was appreciated.   Since completing XRT, the patient followed up with Dr. Al Pimple on 03/09/23. During which time, the patient endorsed some fatigue and neuropathy which are both improving. There is no role for antiestrogen therapy moving forward due to her negative ER status. In light of this and her complete response to neoadjuvant treatment, Dr. Al Pimple recommends proceeding with repeat imaging in June for surveillance.         ***                         Allergies:  is allergic to [facility] [aprepitant], codeine, and latex.  Meds: Current Outpatient Medications  Medication Sig Dispense Refill   acetaminophen (TYLENOL) 500 MG tablet Take 1,000 mg by mouth every 6 (six) hours as needed for moderate  pain.     ALPRAZolam (XANAX) 0.25 MG tablet Take 1 tablet (0.25 mg total) by mouth 2 (two) times daily as needed for anxiety. 30 tablet 0   atorvastatin (LIPITOR) 10 MG tablet Take 10 mg by mouth every Monday, Wednesday, and Friday.     DULoxetine (CYMBALTA) 30 MG capsule TAKE ONE CAPSULE BY MOUTH ONCE DAILY FOR 1 WEEK, THEN 2 CAPSULES ONCE A DAY FROM THERE ON 60 capsule 0   JARDIANCE 10 MG TABS tablet Take 10 mg by mouth in the morning.     lidocaine-prilocaine (EMLA) cream Apply to affected area once 30 g 3   losartan (COZAAR) 100 MG tablet Take 0.5 tablets (50 mg total) by mouth in the morning.     meclizine (ANTIVERT) 25 MG tablet Take 25 mg by mouth 3 (three) times daily as needed for dizziness.     metFORMIN (GLUCOPHAGE) 1000 MG tablet Take 1,000 mg by mouth in the morning and at bedtime.     metoprolol succinate (TOPROL XL) 50 MG 24 hr tablet Take 1 tablet (50 mg total) by mouth at bedtime. Take with or immediately following a meal. 30 tablet 5   ondansetron (ZOFRAN) 8 MG tablet Take 1 tablet (8 mg total) by mouth 2 (two) times daily as needed. Start on the third day after carboplatin and AC chemotherapy. 30 tablet 1   OZEMPIC, 1 MG/DOSE, 4 MG/3ML SOPN Inject 1  mg into the skin every Wednesday.     No current facility-administered medications for this encounter.    Physical Findings: The patient is in no acute distress. Patient is alert and oriented.  vitals were not taken for this visit. .  No significant changes. Lungs are clear to auscultation bilaterally. Heart has regular rate and rhythm. No palpable cervical, supraclavicular, or axillary adenopathy. Abdomen soft, non-tender, normal bowel sounds.  Right Breast: no palpable mass, nipple discharge or bleeding. Left Breast: ***  Lab Findings: Lab Results  Component Value Date   WBC 4.3 11/16/2022   HGB 10.6 (L) 11/16/2022   HCT 31.4 (L) 11/16/2022   MCV 101.3 (H) 11/16/2022   PLT 171 11/16/2022    Radiographic  Findings: ECHOCARDIOGRAM COMPLETE  Result Date: 03/30/2023    ECHOCARDIOGRAM REPORT   Patient Name:   Sydney Lee Date of Exam: 03/30/2023 Medical Rec #:  [id number]      Height:       69.0 in Accession #:    [id number]     Weight:       200.9 lb Date of Birth:  08-25-1961       BSA:          2.070 m Patient Age:    62 years       BP:           105/63 mmHg Patient Gender: F              HR:           54 bpm. Exam Location:  Outpatient Procedure: 2D Echo, 3D Echo, Cardiac Doppler, Color Doppler and Strain Analysis Indications:    Cardiomyopathy-Unspecified I42.9                 Chemo Z09  History:        Patient has prior history of Echocardiogram examinations, most                 recent 10/11/2022. Cardiomyopathy; Risk Factors:Hypertension and                 Diabetes.  Sonographer:    Eulah Pont RDCS Referring Phys: [id number] DANIEL R BENSIMHON  Sonographer Comments: Global longitudinal strain was attempted. IMPRESSIONS  1. GLS borderline. Left ventricular ejection fraction, by estimation, is 50 to 55%. The left ventricle has low normal function. The left ventricle has no regional wall motion abnormalities. Left ventricular diastolic parameters are indeterminate. The average left ventricular global longitudinal strain is -17.0 %.  2. Right ventricular systolic function is normal. The right ventricular size is normal. There is normal pulmonary artery systolic pressure.  3. No evidence of mitral valve regurgitation.  4. The aortic valve is normal in structure. Aortic valve regurgitation is not visualized.  5. The inferior vena cava is normal in size with greater than 50% respiratory variability, suggesting right atrial pressure of 3 mmHg. FINDINGS  Left Ventricle: GLS borderline. Left ventricular ejection fraction, by estimation, is 50 to 55%. The left ventricle has low normal function. The left ventricle has no regional wall motion abnormalities. The average left ventricular global longitudinal strain is -17.0 %.  The left ventricular internal cavity size was normal in size. There is no left ventricular hypertrophy. Left ventricular diastolic parameters are indeterminate. Right Ventricle: The right ventricular size is normal. Right ventricular systolic function is normal. There is normal pulmonary artery systolic pressure. The tricuspid regurgitant velocity is 2.16 m/s, and with an assumed right atrial pressure  of 3 mmHg,  the estimated right ventricular systolic pressure is 21.7 mmHg. Left Atrium: Left atrial size was normal in size. Right Atrium: Right atrial size was normal in size. Pericardium: There is no evidence of pericardial effusion. Mitral Valve: No evidence of mitral valve regurgitation. Tricuspid Valve: Tricuspid valve regurgitation is mild. Aortic Valve: The aortic valve is normal in structure. Aortic valve regurgitation is not visualized. Pulmonic Valve: Pulmonic valve regurgitation is mild. Aorta: The aortic root and ascending aorta are structurally normal, with no evidence of dilitation. Venous: The inferior vena cava is normal in size with greater than 50% respiratory variability, suggesting right atrial pressure of 3 mmHg. IAS/Shunts: No atrial level shunt detected by color flow Doppler.  LEFT VENTRICLE PLAX 2D LVIDd:         5.10 cm     Diastology LVIDs:         3.20 cm     LV e' medial:    6.08 cm/s LV PW:         0.80 cm     LV E/e' medial:  10.2 LV IVS:        0.80 cm     LV e' lateral:   7.95 cm/s LVOT diam:     2.00 cm     LV E/e' lateral: 7.8 LV SV:         52 LV SV Index:   25          2D Longitudinal Strain LVOT Area:     3.14 cm    2D Strain GLS Avg:     -17.0 %  LV Volumes (MOD) LV vol d, MOD A2C: 87.2 ml 3D Volume EF: LV vol d, MOD A4C: 93.4 ml 3D EF:        53 % LV vol s, MOD A2C: 34.5 ml LV EDV:       106 ml LV vol s, MOD A4C: 35.6 ml LV ESV:       50 ml LV SV MOD A2C:     52.7 ml LV SV:        57 ml LV SV MOD A4C:     93.4 ml LV SV MOD BP:      54.3 ml RIGHT VENTRICLE RV S prime:     10.10  cm/s TAPSE (M-mode): 1.8 cm LEFT ATRIUM             Index        RIGHT ATRIUM           Index LA diam:        3.70 cm 1.79 cm/m   RA Area:     11.70 cm LA Vol (A2C):   42.7 ml 20.63 ml/m  RA Volume:   23.10 ml  11.16 ml/m LA Vol (A4C):   47.3 ml 22.85 ml/m LA Biplane Vol: 45.3 ml 21.88 ml/m  AORTIC VALVE             PULMONIC VALVE LVOT Vmax:   81.70 cm/s  PR End Diast Vel: 3.01 msec LVOT Vmean:  49.700 cm/s LVOT VTI:    0.167 m  AORTA Ao Root diam: 2.90 cm Ao Asc diam:  2.80 cm MITRAL VALVE               TRICUSPID VALVE MV Area (PHT): 3.42 cm    TR Peak grad:   18.7 mmHg MV Decel Time: 222 msec    TR Vmax:        216.00 cm/s  MV E velocity: 61.80 cm/s MV A velocity: 60.30 cm/s  SHUNTS MV E/A ratio:  1.02        Systemic VTI:  0.17 m                            Systemic Diam: 2.00 cm Carolan Clines Electronically signed by Carolan Clines Signature Date/Time: 03/30/2023/5:08:10 PM    Final     Impression: No evidence of residual invasive or in-situ carcinoma in the left breast s/p neoadjuvant chemotherapy    Stage IIB (cT2, cN0, cM0) Left Breast UIQ, Invasive poorly differentiated ductal adenocarcinoma , ER- / PR- / Her2-, Grade 3      The patient is recovering from the effects of radiation.  ***  Plan:  ***   *** minutes of total time was spent for this patient encounter, including preparation, face-to-face counseling with the patient and coordination of care, physical exam, and documentation of the encounter. ____________________________________  Billie Lade, PhD, MD  This document serves as a record of services personally performed by Antony Blackbird, MD. It was created on his behalf by Neena Rhymes, a trained medical scribe. The creation of this record is based on the scribe's personal observations and the provider's statements to them. This document has been checked and approved by the attending provider.

## 2023-04-07 NOTE — Progress Notes (Signed)
  Radiation Oncology         [phone] ________________________________  Name: Sydney Lee MRN: [id number]  Date: 04/10/2023  DOB: 27-Sep-1961  End of Treatment Note  Diagnosis: No evidence of residual invasive or in-situ carcinoma in the left breast s/p neoadjuvant chemotherapy    Stage IIB (cT2, cN0, cM0) Left Breast UIQ, Invasive poorly differentiated ductal adenocarcinoma , ER- / PR- / Her2-, Grade 3      Indication for treatment: Curative        Radiation treatment dates: 02/07/23 through 03/07/23   Site/dose:  1) Left breast - treated with 40.05 Gy delivered in 15 Fx at 2.67 Gy/Fx 2) Left Breast Boost - treated with 10 Gy in 5 Fx at 2 Gy/Fx  Beams/energy: 10X-FFF  Narrative: The patient tolerated radiation treatment relatively well. On the date of her final treatment, the patient reported having some mild skin irritation. She denied any other side effects or concerns from treatment. Physical exam performed on the date of her final treatment showed hyperpigmentation changes throughout the left breast. No skin breakdown was appreciated.  Plan: The patient has completed radiation treatment. The patient will return to radiation oncology clinic for routine followup in one month. I advised them to call or return sooner if they have any questions or concerns related to their recovery or treatment.  -----------------------------------  Billie Lade, PhD, MD  This document serves as a record of services personally performed by Antony Blackbird, MD. It was created on his behalf by Neena Rhymes, a trained medical scribe. The creation of this record is based on the scribe's personal observations and the provider's statements to them. This document has been checked and approved by the attending provider.

## 2023-04-10 ENCOUNTER — Ambulatory Visit
Admission: RE | Admit: 2023-04-10 | Discharge: 2023-04-10 | Disposition: A | Discharge status: Home / Self Care | Payer: BC Managed Care – PPO | Source: Ambulatory Visit | Attending: Radiation Oncology | Admitting: Radiation Oncology

## 2023-04-10 ENCOUNTER — Encounter: Payer: Self-pay | Admitting: Radiation Oncology

## 2023-04-10 VITALS — BP 95/56 | HR 70 | Temp 98.3°F | Resp 20 | Wt 197.2 lb

## 2023-04-10 DIAGNOSIS — Z171 Estrogen receptor negative status [ER-]: Secondary | ICD-10-CM

## 2023-04-10 DIAGNOSIS — C50212 Malignant neoplasm of upper-inner quadrant of left female breast: Secondary | ICD-10-CM | POA: Diagnosis not present

## 2023-04-10 DIAGNOSIS — Z79899 Other long term (current) drug therapy: Secondary | ICD-10-CM | POA: Diagnosis not present

## 2023-04-10 DIAGNOSIS — R5383 Other fatigue: Secondary | ICD-10-CM | POA: Insufficient documentation

## 2023-04-10 DIAGNOSIS — Z7984 Long term (current) use of oral hypoglycemic drugs: Secondary | ICD-10-CM | POA: Insufficient documentation

## 2023-04-10 DIAGNOSIS — Z923 Personal history of irradiation: Secondary | ICD-10-CM | POA: Insufficient documentation

## 2023-04-10 NOTE — Progress Notes (Signed)
Sydney Lee is here today for follow up post radiation to the breast.   Breast Side:Left   They completed their radiation on: 03/07/23   Does the patient complain of any of the following: Post radiation skin issues:  Continues to  have mild darken to left breast.  Breast Tenderness:  Reports pain at times to incision  site with lifting.  Breast Swelling: No Lymphadema: No Range of Motion limitations: No Fatigue post radiation: yes, continues to have a low energy level. Appetite good/fair/poor: Good   Additional comments if applicable:  Continues to have numbness and weakness to bilateral hands and feet.   BP (!) 95/56   Pulse 70   Temp 98.3 F (36.8 C)   Resp 20   Wt 197 lb 3.2 oz (89.4 kg)   SpO2 96%   BMI 29.12 kg/m

## 2023-05-05 ENCOUNTER — Other Ambulatory Visit: Payer: Self-pay | Admitting: Hematology and Oncology

## 2023-05-11 ENCOUNTER — Other Ambulatory Visit: Payer: Self-pay | Admitting: Hematology and Oncology

## 2023-05-11 ENCOUNTER — Other Ambulatory Visit: Payer: Self-pay | Admitting: *Deleted

## 2023-05-11 MED ORDER — DULOXETINE HCL 30 MG PO CPEP: 60.0000 mg | ORAL_CAPSULE | Freq: Every day | ORAL | 0 refills | Status: DC

## 2023-06-12 ENCOUNTER — Other Ambulatory Visit (HOSPITAL_COMMUNITY): Payer: Self-pay | Admitting: Internal Medicine

## 2023-06-15 ENCOUNTER — Other Ambulatory Visit: Payer: Self-pay | Admitting: Hematology and Oncology

## 2023-06-16 ENCOUNTER — Ambulatory Visit
Admission: RE | Admit: 2023-06-16 | Discharge: 2023-06-16 | Disposition: A | Discharge status: Home / Self Care | Payer: BC Managed Care – PPO | Source: Ambulatory Visit | Attending: Hematology and Oncology | Admitting: Hematology and Oncology

## 2023-06-16 DIAGNOSIS — C50212 Malignant neoplasm of upper-inner quadrant of left female breast: Secondary | ICD-10-CM

## 2023-06-16 HISTORY — DX: Personal history of antineoplastic chemotherapy: Z92.21

## 2023-06-16 HISTORY — DX: Personal history of irradiation: Z92.3

## 2023-06-19 ENCOUNTER — Telehealth: Payer: Self-pay | Admitting: Adult Health

## 2023-06-19 NOTE — Telephone Encounter (Signed)
Scheduled appointment per scheduling message. Left voicemail for patient.  

## 2023-06-23 IMAGING — US US BREAST BX W LOC DEV 1ST LESION IMG BX SPEC US GUIDE*L*
1 series · 12 of 25 positions shown · non-contrast
Comparison: Prior exams
COMPARISON: Prior exams

Addendum:
CLINICAL DATA: Patient presents for ultrasound-guided core needle
biopsy of a left breast mass and 1 of 3 abnormal left axillary lymph
nodes. On diagnostic imaging, there was also a small satellite
lesion adjacent to the index left breast mass. Imaging prior to
biopsy demonstrated a projection from the mass, which was contiguous
with the mass. There was no visualized separate satellite lesion.

EXAM:
ULTRASOUND GUIDED LEFT BREAST CORE NEEDLE BIOPSY
ULTRASOUND GUIDED LEFT AXILLARY LYMPH NODE CORE NEEDLE BIOPSY

[Series 1: us breast bx w loc dev 1st lesion img bx spec us g · 0.09mm/px · 12 of 31 slices shown]
[im 2/31]
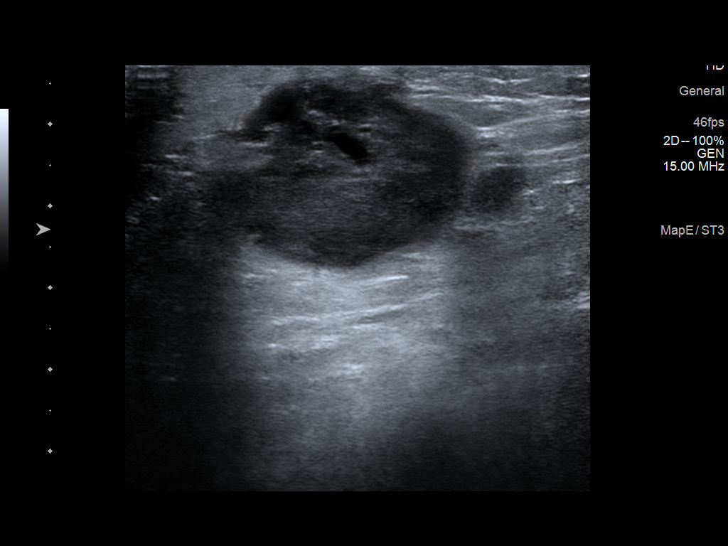
[im 4/31]
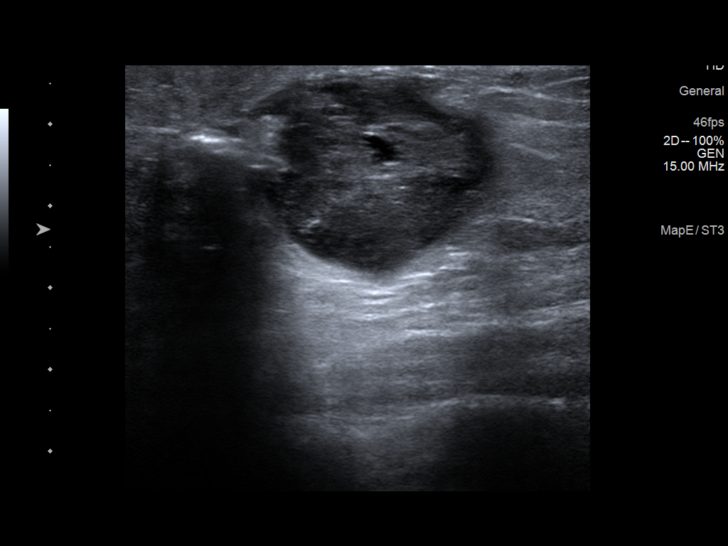
[im 7/31]
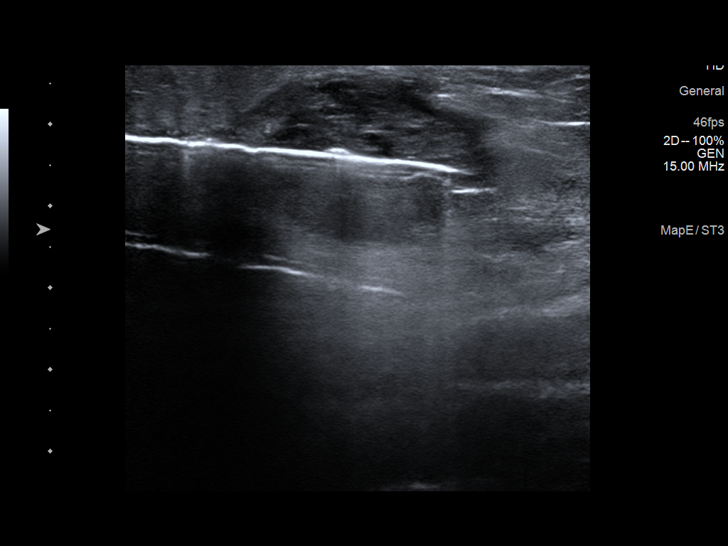
[im 9/31]
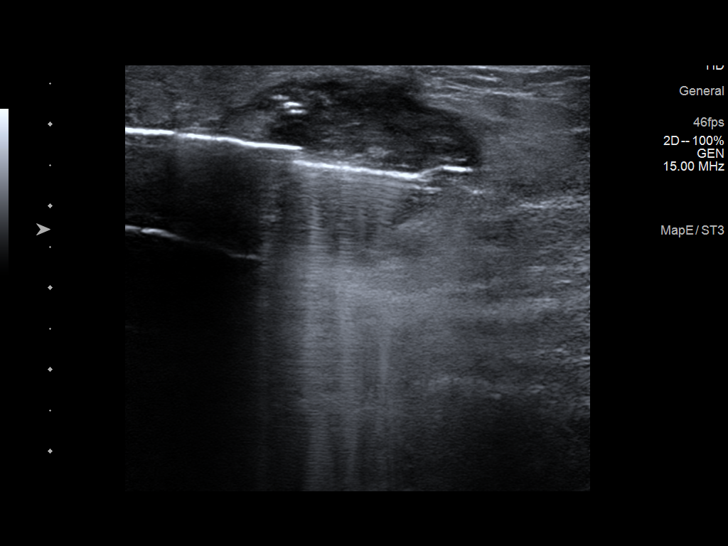
[im 12/31]
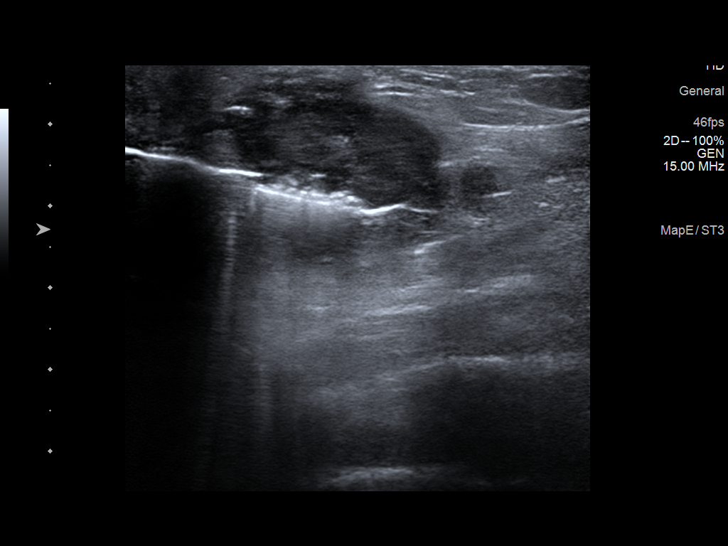
[im 14/31]
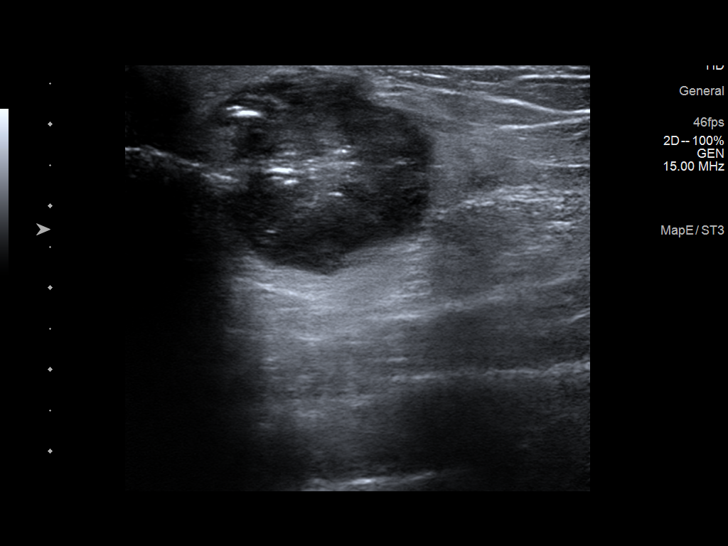
[im 17/31]
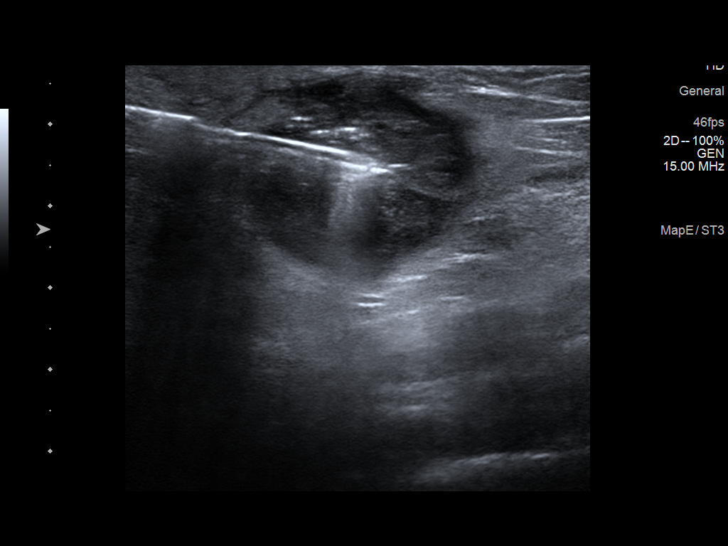
[im 19/31]
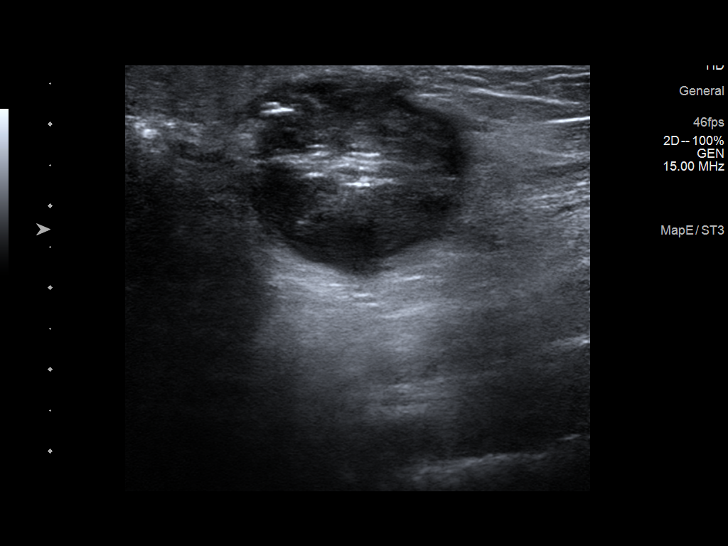
[im 22/31]
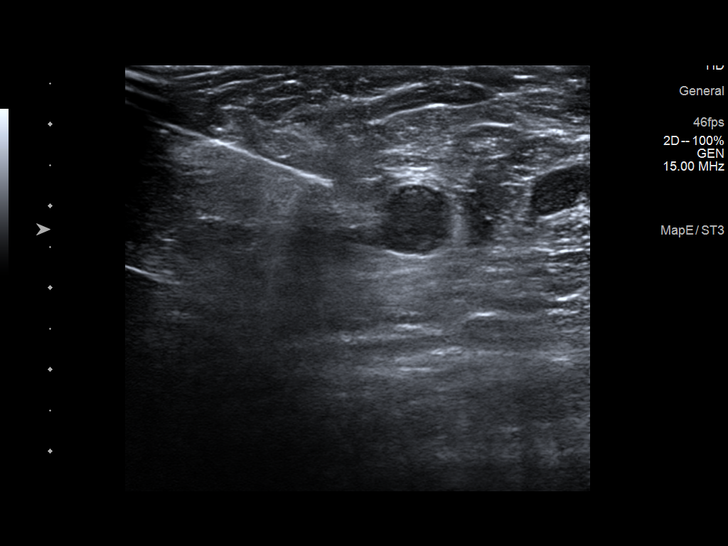
[im 24/31]
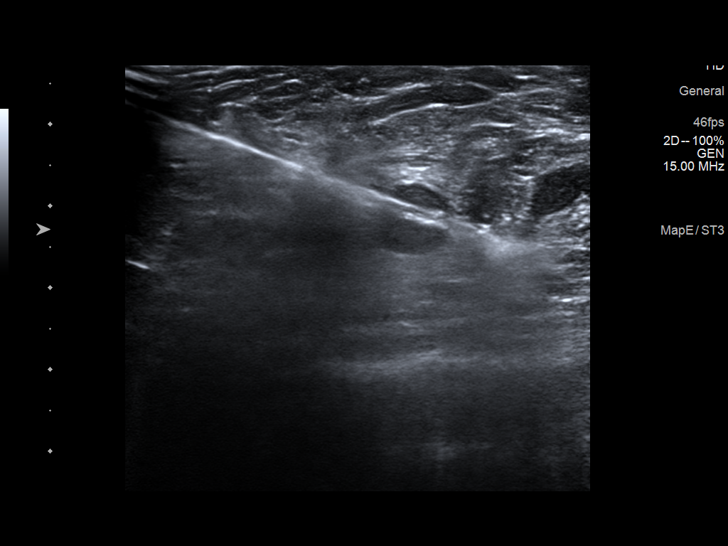
[im 27/31]
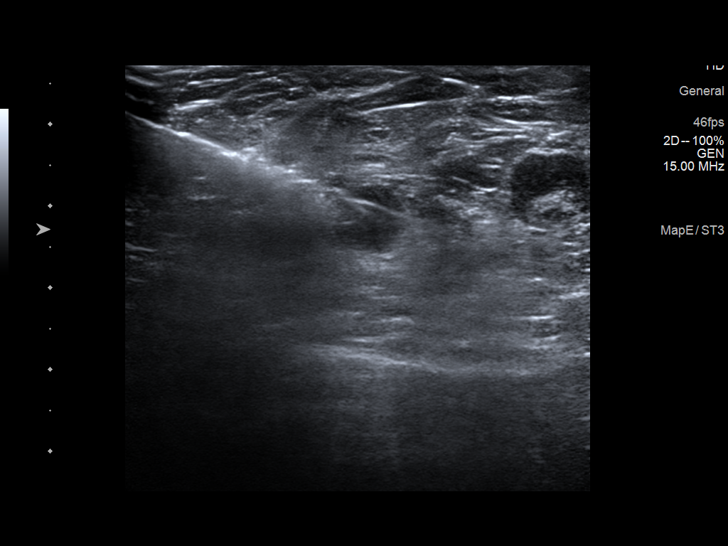
[im 29/31]
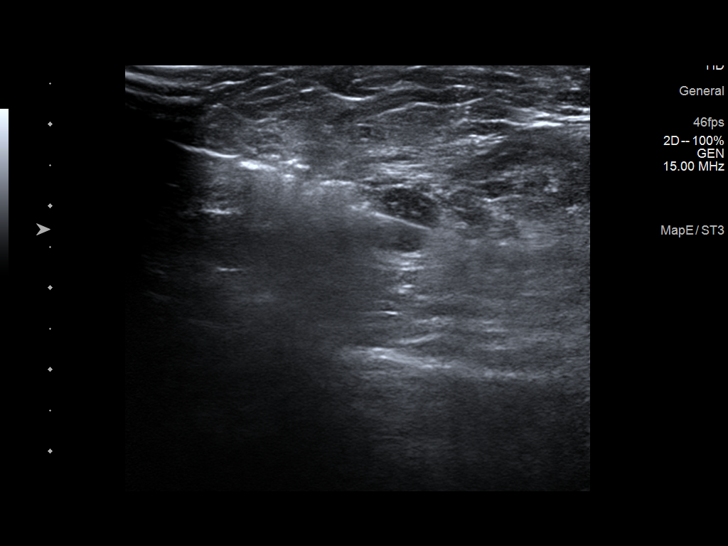

[12 of 25 positions shown; findings below may reference images not displayed]



Biopsy #1: Mass at 10 o'clock, 25 cm from the nipple.

Lesion quadrant: Upper inner quadrant

Using sterile technique and 1% Lidocaine as local anesthetic, under
direct ultrasound visualization, a 12 gauge Jim device was
used to perform biopsy of the 2.9 cm mass using an inferior
approach. At the conclusion of the procedure ribbon shaped tissue
marker clip was deployed into the biopsy cavity.

Biopsy #2: Abnormal left axillary lymph node. A round lymph node
without evidence of hilar fat was targeted.

Lesion location: Left axilla.

Using sterile technique and 1% Lidocaine as local anesthetic, under
direct ultrasound visualization, a 14 gauge Jim device was
used to perform biopsy of the round abnormal left axillary lymph
node using a lateral approach. At the conclusion of the procedure a
HydroMARK tissue marker clip was deployed into the biopsy cavity.

Follow up 2 view mammogram was performed and dictated separately.
IMPRESSION: Ultrasound guided biopsy of a left breast mass and an abnormal left
axillary lymph node. No apparent complications.

ADDENDUM:
Pathology revealed INVASIVE POORLY DIFFERENTIATED DUCTAL
ADENOCARCINOMA, GRADE 3 (3+3+3) WITH TUMOR NECROSIS of the LEFT
breast, 10 o'clock, 25 cmfn, (ribbon clip). This was found to be
concordant by Dr. Hosung Hesketh

Pathology revealed COMPATIBLE WITH A BENIGN REACTIVE LYMPH NODE of
the LEFT axilla, (hydromark clip). This was found to be concordant
by Dr. Hosung Hesketh.

Pathology results were discussed with the patient by telephone. The
patient reported doing well after the biopsies with tenderness and
bruising at the sites. Post biopsy instructions and care were
reviewed and questions were answered. The patient was encouraged to
call The [REDACTED] for any additional
concerns. My direct phone number was provided.

The patient was referred to [REDACTED]
[REDACTED] at [REDACTED] on
May 25, 2022.

Pathology results reported by Osei Kwabena Mate-Kole, RN on 05/18/2022.



Biopsy #1: Mass at 10 o'clock, 25 cm from the nipple.

Lesion quadrant: Upper inner quadrant

Using sterile technique and 1% Lidocaine as local anesthetic, under
direct ultrasound visualization, a 12 gauge Jim device was
used to perform biopsy of the 2.9 cm mass using an inferior
approach. At the conclusion of the procedure ribbon shaped tissue
marker clip was deployed into the biopsy cavity.

Biopsy #2: Abnormal left axillary lymph node. A round lymph node
without evidence of hilar fat was targeted.

Lesion location: Left axilla.

Using sterile technique and 1% Lidocaine as local anesthetic, under
direct ultrasound visualization, a 14 gauge Jim device was
used to perform biopsy of the round abnormal left axillary lymph
node using a lateral approach. At the conclusion of the procedure a
HydroMARK tissue marker clip was deployed into the biopsy cavity.

Follow up 2 view mammogram was performed and dictated separately.
IMPRESSION: Ultrasound guided biopsy of a left breast mass and an abnormal left
axillary lymph node. No apparent complications.

## 2023-06-23 IMAGING — MG MM BREAST LOCALIZATION CLIP
4 series · 4 of 12 positions shown · non-contrast
Comparison: Previous exam(s).

CLINICAL DATA: Assess post biopsy marker clip following
ultrasound-guided core needle biopsy of a left breast mass and left
axillary lymph node.

EXAM:
3D DIAGNOSTIC LEFT MAMMOGRAM POST ULTRASOUND BIOPSY

[L MLO synth-2D]
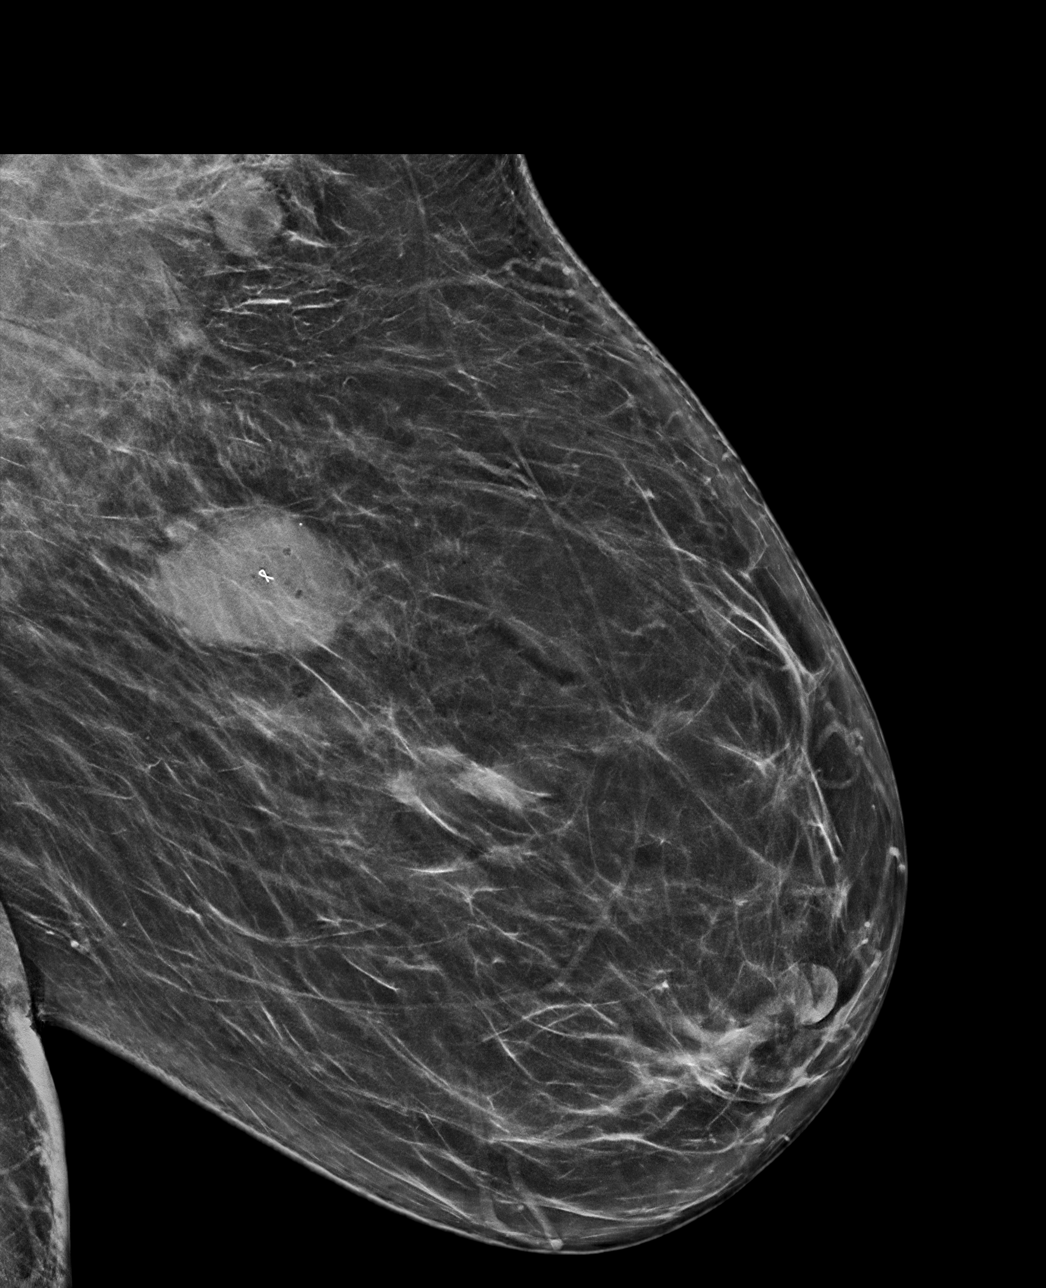

[L CC synth-2D]
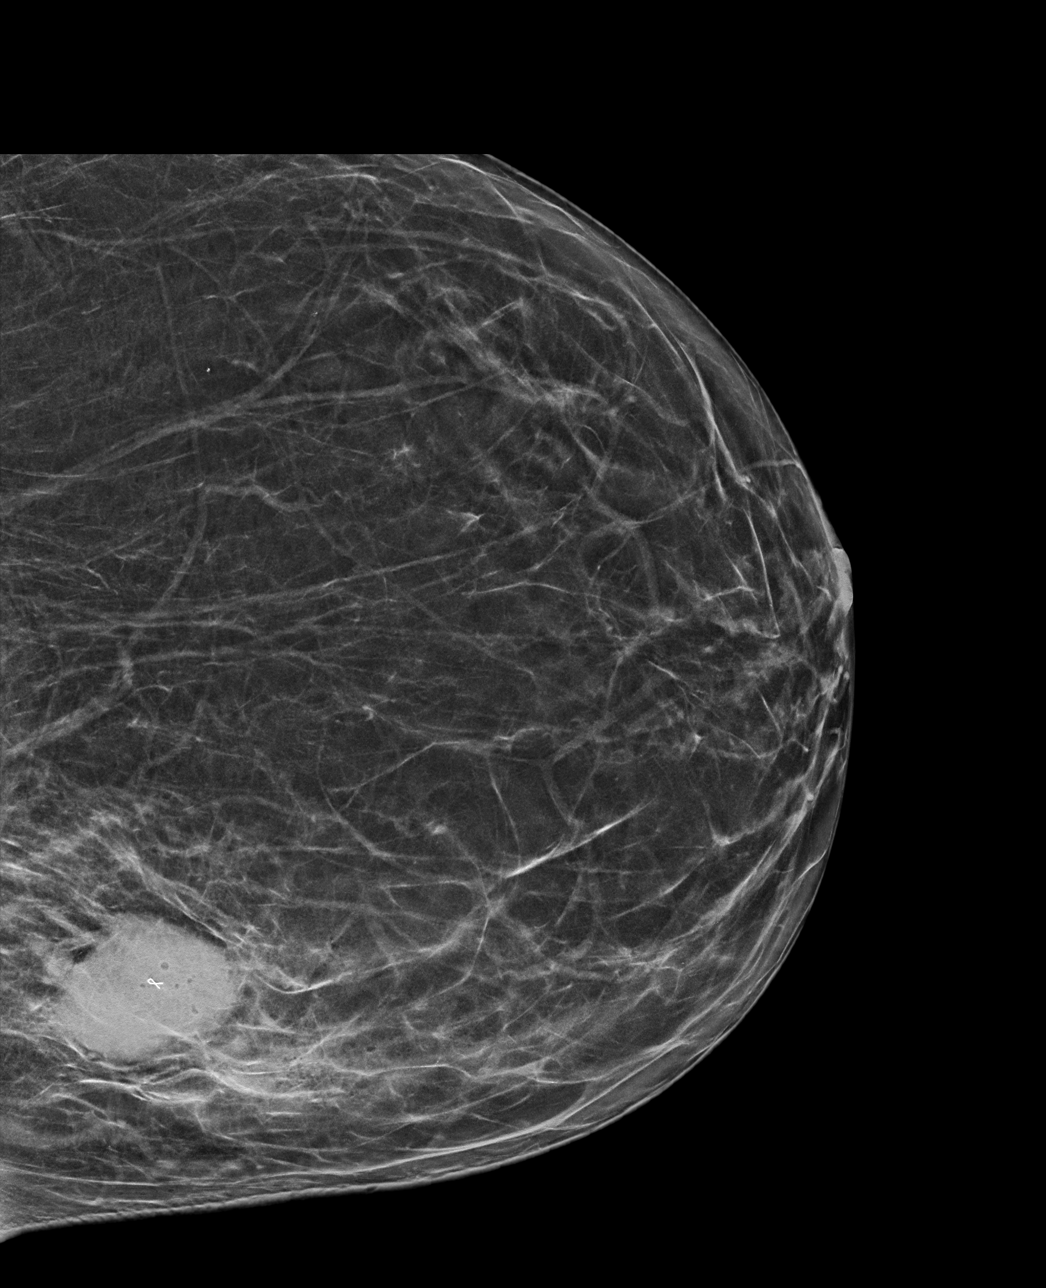

[L MLO tomo · tomo slice 41/81.0]
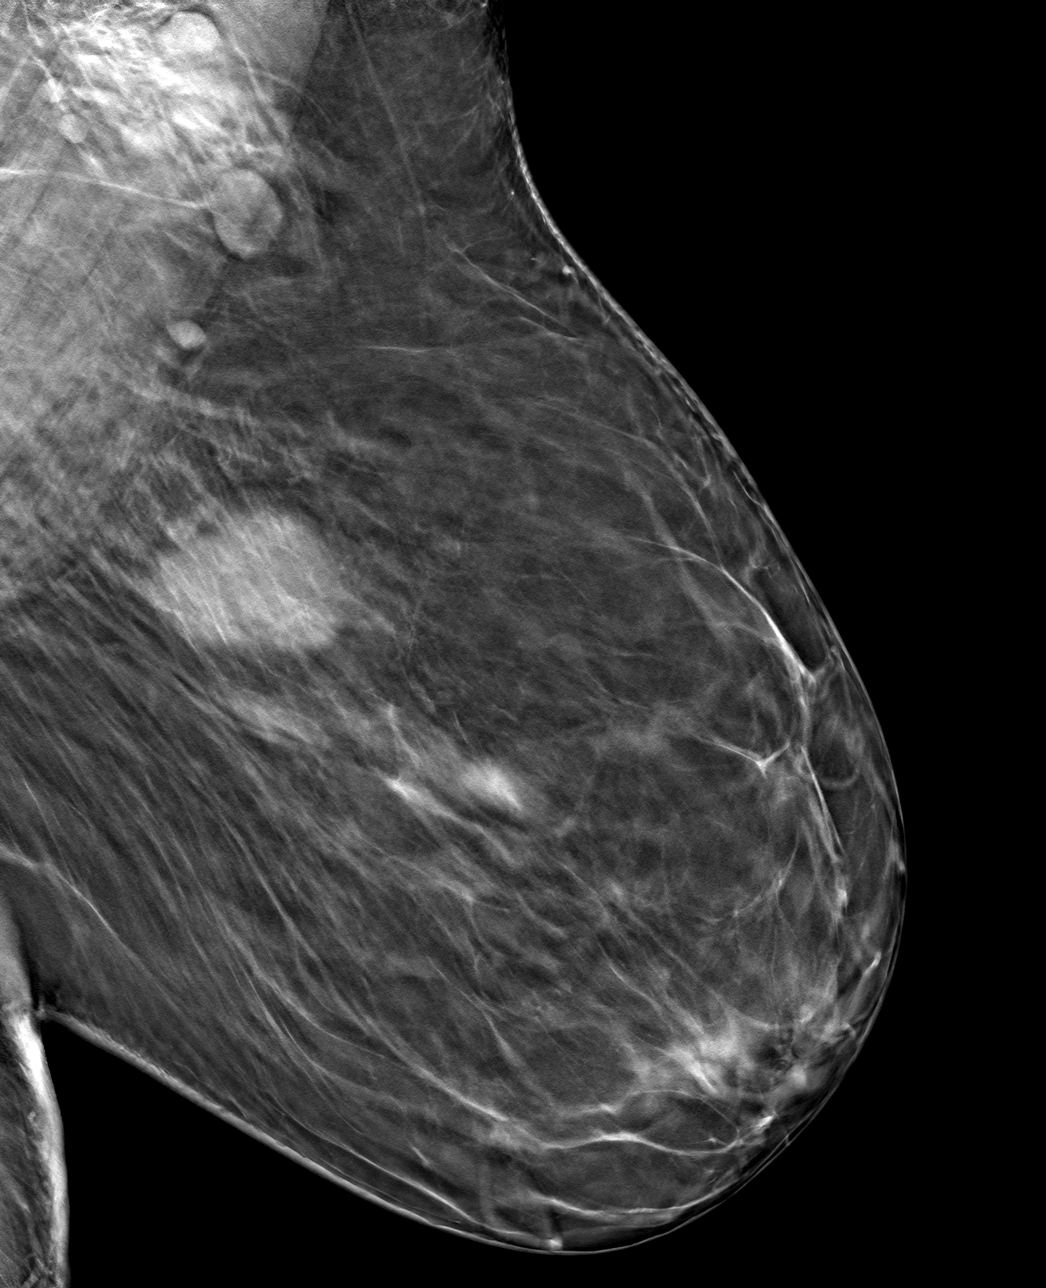

[L CC tomo · tomo slice 35/68.0]
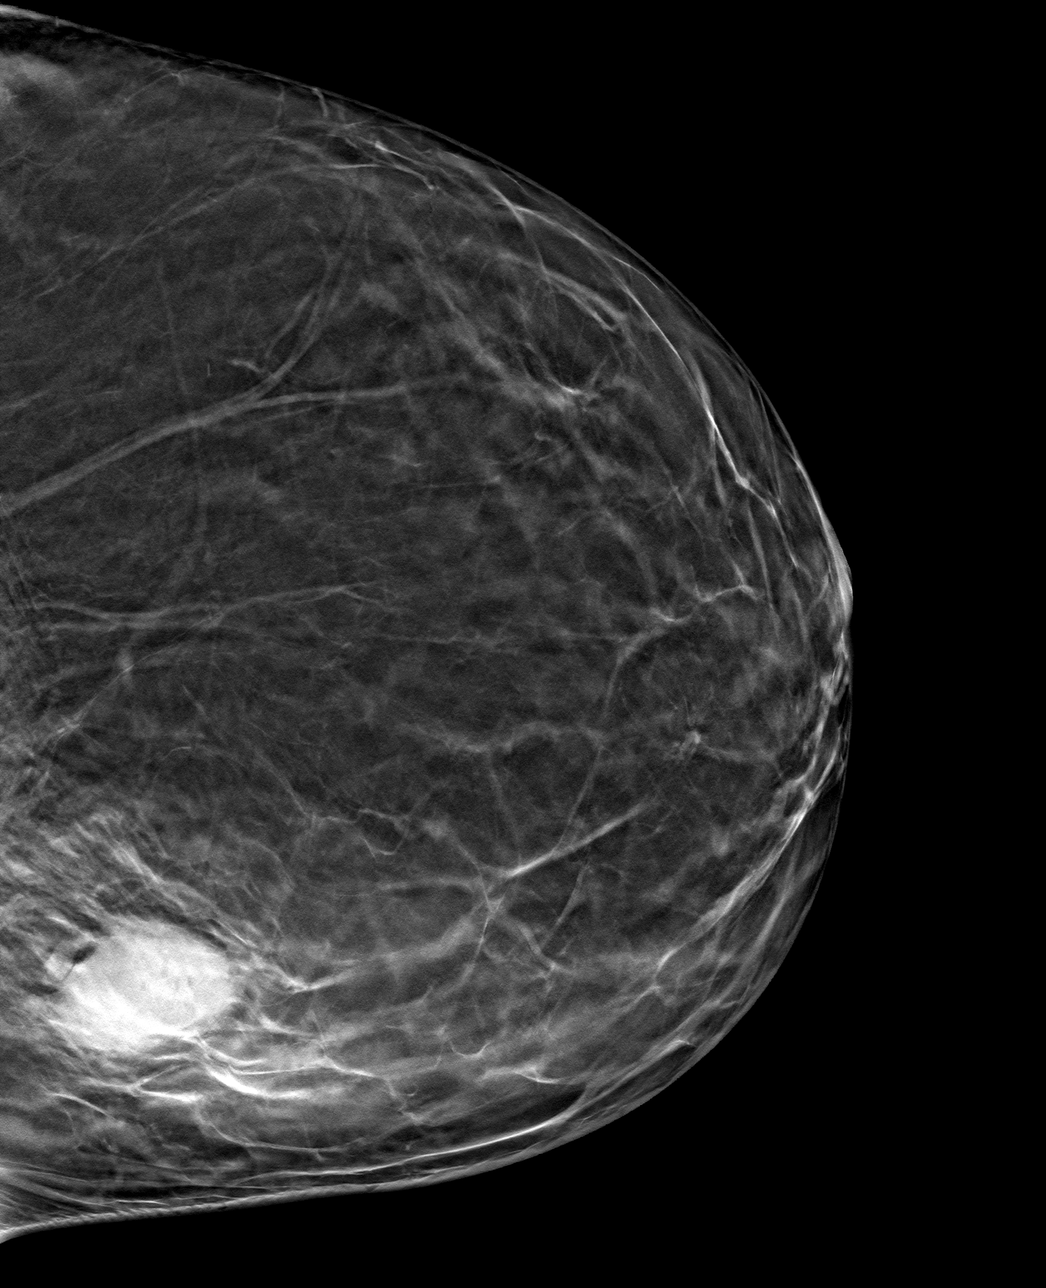

[4 of 12 positions shown; findings below may reference images not displayed]

FINDINGS: 3D Mammographic images were obtained following ultrasound guided
biopsy of a left breast mass and left axillary lymph node. The
ribbon shaped biopsy clip lies within the center of the medial,
posterior left breast mass, and the HydroMARK clip lies within 1 of
several adjacent lymph nodes in the left axilla.
IMPRESSION: Appropriate positioning of the ribbon shaped and HydroMARK biopsy
marking clips at the site of biopsy in the medial left breast and
left axilla respectively.

Final Assessment: Post Procedure Mammograms for Marker Placement

## 2023-07-21 ENCOUNTER — Other Ambulatory Visit: Payer: Self-pay | Admitting: Hematology and Oncology

## 2023-07-21 DIAGNOSIS — Z171 Estrogen receptor negative status [ER-]: Secondary | ICD-10-CM

## 2023-07-24 ENCOUNTER — Other Ambulatory Visit: Payer: Self-pay | Admitting: *Deleted

## 2023-07-24 ENCOUNTER — Telehealth: Payer: Self-pay

## 2023-07-24 NOTE — Telephone Encounter (Signed)
Called Mrs. Nolden about her port a cath being flushed.  She will get her port flushed before the appointment on 8/23.  We moved her 8/25 appt to 8/23 because she starts work on 8/26 and did not know that when she scheduled the 8/26 appt. She will bring up having her port possibly removed when she sees Shelby on 8/23. Routing note to [place] and Dr. Al Pimple for their information.  Lorayne Marek, RN

## 2023-07-24 NOTE — Telephone Encounter (Signed)
Patient still has port. Refill appropriate. Lorayne Marek, RN

## 2023-07-27 ENCOUNTER — Other Ambulatory Visit: Payer: Self-pay

## 2023-07-27 DIAGNOSIS — Z171 Estrogen receptor negative status [ER-]: Secondary | ICD-10-CM

## 2023-07-28 ENCOUNTER — Encounter: Payer: Self-pay | Admitting: Adult Health

## 2023-07-28 ENCOUNTER — Inpatient Hospital Stay: Payer: BC Managed Care – PPO

## 2023-07-28 ENCOUNTER — Inpatient Hospital Stay: Payer: BC Managed Care – PPO | Attending: Adult Health | Admitting: Adult Health

## 2023-07-28 VITALS — BP 105/64 | HR 71 | Temp 97.5°F | Resp 18 | Ht 69.0 in | Wt 199.0 lb

## 2023-07-28 DIAGNOSIS — Z923 Personal history of irradiation: Secondary | ICD-10-CM | POA: Insufficient documentation

## 2023-07-28 DIAGNOSIS — Z853 Personal history of malignant neoplasm of breast: Secondary | ICD-10-CM | POA: Diagnosis present

## 2023-07-28 DIAGNOSIS — R9389 Abnormal findings on diagnostic imaging of other specified body structures: Secondary | ICD-10-CM | POA: Diagnosis not present

## 2023-07-28 DIAGNOSIS — Z95828 Presence of other vascular implants and grafts: Secondary | ICD-10-CM

## 2023-07-28 DIAGNOSIS — Z9221 Personal history of antineoplastic chemotherapy: Secondary | ICD-10-CM | POA: Diagnosis not present

## 2023-07-28 DIAGNOSIS — Z171 Estrogen receptor negative status [ER-]: Secondary | ICD-10-CM | POA: Diagnosis not present

## 2023-07-28 DIAGNOSIS — Z87891 Personal history of nicotine dependence: Secondary | ICD-10-CM | POA: Insufficient documentation

## 2023-07-28 DIAGNOSIS — Z452 Encounter for adjustment and management of vascular access device: Secondary | ICD-10-CM | POA: Diagnosis present

## 2023-07-28 DIAGNOSIS — C50212 Malignant neoplasm of upper-inner quadrant of left female breast: Secondary | ICD-10-CM | POA: Diagnosis not present

## 2023-07-28 DIAGNOSIS — R5383 Other fatigue: Secondary | ICD-10-CM

## 2023-07-28 LAB — CBC WITH DIFFERENTIAL (CANCER CENTER ONLY)
Abs Immature Granulocytes: 0.01 10*3/uL (ref 0.00–0.07)
Basophils Absolute: 0 10*3/uL (ref 0.0–0.1)
Basophils Relative: 1 %
Eosinophils Absolute: 0.1 10*3/uL (ref 0.0–0.5)
Eosinophils Relative: 4 %
HCT: 35.1 % — ABNORMAL LOW (ref 36.0–46.0)
Hemoglobin: 11.9 g/dL — ABNORMAL LOW (ref 12.0–15.0)
Immature Granulocytes: 0 %
Lymphocytes Relative: 41 %
Lymphs Abs: 1.4 10*3/uL (ref 0.7–4.0)
MCH: 31.7 pg (ref 26.0–34.0)
MCHC: 33.9 g/dL (ref 30.0–36.0)
MCV: 93.6 fL (ref 80.0–100.0)
Monocytes Absolute: 0.4 10*3/uL (ref 0.1–1.0)
Monocytes Relative: 11 %
Neutro Abs: 1.5 10*3/uL — ABNORMAL LOW (ref 1.7–7.7)
Neutrophils Relative %: 43 %
Platelet Count: 194 10*3/uL (ref 150–400)
RBC: 3.75 MIL/uL — ABNORMAL LOW (ref 3.87–5.11)
RDW: 13 % (ref 11.5–15.5)
WBC Count: 3.4 10*3/uL — ABNORMAL LOW (ref 4.0–10.5)
nRBC: 0 % (ref 0.0–0.2)

## 2023-07-28 LAB — TSH: TSH: 0.891 u[IU]/mL (ref 0.350–4.500)

## 2023-07-28 LAB — CMP (CANCER CENTER ONLY)
ALT: 12 U/L (ref 0–44)
AST: 13 U/L — ABNORMAL LOW (ref 15–41)
Albumin: 4 g/dL (ref 3.5–5.0)
Alkaline Phosphatase: 84 U/L (ref 38–126)
Anion gap: 5 (ref 5–15)
BUN: 11 mg/dL (ref 8–23)
CO2: 30 mmol/L (ref 22–32)
Calcium: 9.2 mg/dL (ref 8.9–10.3)
Chloride: 104 mmol/L (ref 98–111)
Creatinine: 0.73 mg/dL (ref 0.44–1.00)
GFR, Estimated: >60 mL/min (ref >60)
Glucose, Bld: 129 mg/dL — ABNORMAL HIGH (ref 70–99)
Potassium: 4 mmol/L (ref 3.5–5.1)
Sodium: 139 mmol/L (ref 135–145)
Total Bilirubin: 0.4 mg/dL (ref 0.3–1.2)
Total Protein: 7.3 g/dL (ref 6.5–8.1)

## 2023-07-28 MED ORDER — SODIUM CHLORIDE 0.9% FLUSH
10.0000 mL | Freq: Once | INTRAVENOUS | Status: AC
  Administered 2023-07-28: 10 mL

## 2023-07-28 MED ORDER — HEPARIN SOD (PORK) LOCK FLUSH 100 UNIT/ML IV SOLN
500.0000 [IU] | Freq: Once | INTRAVENOUS | Status: AC
  Administered 2023-07-28: 500 [IU]

## 2023-07-28 NOTE — Progress Notes (Signed)
SURVIVORSHIP VISIT:  BRIEF ONCOLOGIC HISTORY:  Oncology History  Malignant neoplasm of upper-inner quadrant of left breast in female, estrogen receptor negative (HCC)  04/27/2022 Mammogram   Diagnostic mammogram showed indeterminate solid mass in the 10:00 location of the left breast.  Small satellite nodule adjacent to the index mass.  Borderline lymph node has cortical thickening of 2.9 mm.  Other lymph nodes have normal morphology.   05/16/2022 Pathology Results   Left breast needle core biopsy showed invasive poorly differentiated adenocarcinoma, grade 3 with tumor necrosis, lymph node biopsy benign reactive, negative for carcinoma.  Prognostics from the tumor showed ER 0%, negative, PR 0%, negative, Ki-67 of 80% and HER2 negative   05/25/2022 Cancer Staging   Staging form: Breast, AJCC 8th Edition - Clinical: Stage IIB (cT2, cN0, cM0, G3, ER-, PR-, HER2-) - Signed by Rachel Moulds, MD on 05/25/2022 Stage prefix: Initial diagnosis Histologic grading system: 3 grade system   05/25/2022 Genetic Testing   [facility] CancerNext-Expanded Panel was Negative. Report date was 06/02/2022.  The CancerNext-Expanded gene panel offered by [facility] and includes sequencing, rearrangement, and RNA analysis for the following 77 genes: AIP, ALK, APC, ATM, AXIN2, BAP1, BARD1, BLM, BMPR1A, BRCA1, BRCA2, BRIP1, CDC73, CDH1, CDK4, CDKN1B, CDKN2A, CHEK2, CTNNA1, DICER1, FANCC, FH, FLCN, GALNT12, KIF1B, LZTR1, MAX, MEN1, MET, MLH1, MSH2, MSH3, MSH6, MUTYH, NBN, NF1, NF2, NTHL1, PALB2, PHOX2B, PMS2, POT1, PRKAR1A, PTCH1, PTEN, RAD51C, RAD51D, RB1, RECQL, RET, SDHA, SDHAF2, SDHB, SDHC, SDHD, SMAD4, SMARCA4, SMARCB1, SMARCE1, STK11, SUFU, TMEM127, TP53, TSC1, TSC2, VHL and XRCC2 (sequencing and deletion/duplication); EGFR, EGLN1, HOXB13, KIT, MITF, PDGFRA, POLD1, and POLE (sequencing only); EPCAM and GREM1 (deletion/duplication only).    06/06/2022 - 10/07/2022 Chemotherapy   Patient is on Treatment Plan : BREAST  Pembrolizumab (200) D1 + Carboplatin (5) D1 + Paclitaxel (80) D1,8,15 q21d X 4 cycles / Pembrolizumab (200) D1 + AC D1 q21d x 4 cycles     12/15/2022 Definitive Surgery   She had left breast lumpectomy on January 11 which showed no residual carcinoma in situ or invasive carcinoma identified ypT0 all margins negative.  Sentinel lymph nodes 5 out of 5 without any involvement for carcinoma.   02/07/2023 - 03/07/2023 Radiation Therapy   Plan Name: Breast_L_BH Site: Breast, Left Technique: 3D Mode: Photon Dose Per Fraction: 2.67 Gy Prescribed Dose (Delivered / Prescribed): 40.05 Gy / 40.05 Gy Prescribed Fxs (Delivered / Prescribed): 15 / 15   Plan Name: Brst_L_Bst_BH Site: Breast, Left Technique: 3D Mode: Photon Dose Per Fraction: 2 Gy Prescribed Dose (Delivered / Prescribed): 10 Gy / 10 Gy Prescribed Fxs (Delivered / Prescribed): 5 / 5     INTERVAL HISTORY:  Sydney Lee to review her survivorship care plan detailing her treatment course for breast cancer, as well as monitoring long-term side effects of that treatment, education regarding health maintenance, screening, and overall wellness and health promotion.     Overall, Sydney Lee reports feeling quite well.  She still has her left chest wall Port-A-Cath and wants to know when she can have this removed.  Otherwise she is feeling well and has no concerns today.  REVIEW OF SYSTEMS:  Review of Systems  Constitutional:  Negative for appetite change, chills, fatigue, fever and unexpected weight change.  HENT:   Negative for hearing loss, lump/mass and trouble swallowing.   Eyes:  Negative for eye problems and icterus.  Respiratory:  Negative for chest tightness, cough and shortness of breath.   Cardiovascular:  Negative for chest pain, leg swelling and palpitations.  Gastrointestinal:  Negative for abdominal distention, abdominal pain, constipation, diarrhea, nausea and vomiting.  Endocrine: Negative for hot flashes.  Genitourinary:   Negative for difficulty urinating.   Musculoskeletal:  Negative for arthralgias.  Skin:  Negative for itching and rash.  Neurological:  Negative for dizziness, extremity weakness, headaches and numbness.  Hematological:  Negative for adenopathy. Does not bruise/bleed easily.  Psychiatric/Behavioral:  Negative for depression. The patient is not nervous/anxious.    Breast: Denies any new nodularity, masses, tenderness, nipple changes, or nipple discharge.       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 Antony Blackbird   Hypertension    Neuromuscular disorder [facility])    neuropathy hands/feets   Personal history of chemotherapy    Personal history of radiation therapy    Past Surgical History:  Procedure Laterality Date   BREAST BIOPSY  12/13/2022   MM LT RADIOACTIVE SEED LOC MAMMO GUIDE 12/13/2022 [facility] MAMMOGRAP[facility]   BREAST LUMPECTOMY Left 12/2022   BREAST LUMPECTOMY WITH RADIOACTIVE SEED AND SENTINEL LYMPH NODE BIOPSY Left 12/15/2022   Procedure: LEFT BREAST LUMPECTOMY WITH RADIOACTIVE SEED AND SENTINEL LYMPH NODE BIOPSY;  Surgeon: Almond Lint, MD;  Location: [facility] OR;  Service: General;  Laterality: Left;   PORTACATH PLACEMENT N/A 06/01/2022   Procedure: INSERTION PORT-A-CATH WITH ULTRASOUND GUIDANCE;  Surgeon: Almond Lint, MD;  Location: [facility] ORS;  Service: General;  Laterality: N/A;     ALLERGIES:  Allergies  Allergen Reactions   Emend [Aprepitant] Shortness Of Breath and Other (See Comments)    Flushing and shortness of breath    Codeine Nausea Only and Other (See Comments)    "Sick on the stomach"   Latex Itching and Other (See Comments)    Gloves=itching/burning      CURRENT MEDICATIONS:  Outpatient Encounter Medications as of 07/28/2023  Medication Sig   acetaminophen (TYLENOL) 500 MG tablet Take 1,000 mg by mouth every 6 (six) hours as needed for moderate pain.   ALPRAZolam (XANAX) 0.25 MG tablet Take 1 tablet (0.25 mg total) by mouth 2 (two) times daily as needed for anxiety.   atorvastatin (LIPITOR) 10 MG tablet Take 10 mg by mouth every Monday, Wednesday, and Friday.   DULoxetine (CYMBALTA) 30 MG capsule Take 2 capsules (60 mg total) by mouth daily.   JARDIANCE 10 MG TABS tablet Take 10 mg by mouth in the morning.   lidocaine-prilocaine (EMLA) cream APPLY TOPICALLY TO THE AFFECTED AREA 1 TIME AS DIRECTED   losartan (COZAAR) 100 MG tablet Take 0.5 tablets (50 mg total) by mouth in the morning.   meclizine (ANTIVERT) 25 MG tablet Take 25 mg by mouth 3 (three) times daily as needed for dizziness.   metFORMIN (GLUCOPHAGE) 1000 MG tablet Take 1,000 mg by mouth in the morning and at bedtime.   metoprolol succinate (TOPROL-XL) 50 MG 24 hr tablet TAKE 1 TABLET(50 MG) BY MOUTH AT BEDTIME WITH OR IMMEDIATELY FOLLOWING A MEAL   OZEMPIC, 1 MG/DOSE, 4 MG/3ML SOPN Inject 1 mg into the skin every Wednesday.   ondansetron (ZOFRAN) 8 MG tablet Take 1 tablet (8 mg total) by mouth 2 (two) times daily as needed. Start on the third day after  carboplatin and AC chemotherapy. (Patient not taking: Reported on 04/10/2023)   No facility-administered encounter medications on file as of 07/28/2023.     ONCOLOGIC FAMILY HISTORY:  Family History  Problem Relation Age of Onset   Leukemia Mother 73   Throat cancer Maternal Uncle 19   Prostate cancer Maternal Uncle    Breast cancer Cousin 22       maternal first cousin   Ovarian cancer Cousin        maternal first cousin   Breast cancer Cousin 31 - 25       paternal first cousin   Breast cancer Cousin 34 - 34       paternal first cousin     SOCIAL HISTORY:  Social History   Socioeconomic History   Marital  status: Married    Spouse name: Not on file   Number of children: Not on file   Years of education: Not on file   Highest education level: Not on file  Occupational History   Not on file  Tobacco Use   Smoking status: Former    Current packs/day: 0.00    Average packs/day: 1 pack/day for 40.0 years (40.0 ttl pk-yrs)    Types: Cigarettes    Start date: 06/05/1982    Quit date: 06/05/2022    Years since quitting: 1.1   Smokeless tobacco: Never  Vaping Use   Vaping status: Never Used  Substance and Sexual Activity   Alcohol use: No   Drug use: No   Sexual activity: Not on file  Other Topics Concern   Not on file  Social History Narrative   Not on file   Social Determinants of Health   Financial Resource Strain: High Risk (06/14/2022)   Overall Financial Resource Strain (CARDIA)    Difficulty of Paying Living Expenses: Hard  Food Insecurity: No Food Insecurity (01/16/2023)   Hunger Vital Sign    Worried About Running Out of Food in the Last Year: Never true    Ran Out of Food in the Last Year: Never true  Transportation Needs: No Transportation Needs (01/16/2023)   PRAPARE - Administrator, Civil Service (Medical): No    Lack of Transportation (Non-Medical): No  Physical Activity: Not on file  Stress: Not on file  Social Connections: Not on file  Intimate Partner Violence: Not At Risk (01/16/2023)   Humiliation, Afraid, Rape, and Kick questionnaire    Fear of Current or Ex-Partner: No    Emotionally Abused: No    Physically Abused: No    Sexually Abused: No     OBSERVATIONS/OBJECTIVE:  BP 105/64 (BP Location: Right Arm, Patient Position: Sitting)   Pulse 71   Temp (!) 97.5 F (36.4 C) (Tympanic)   Resp 18   Ht 5\' 9"  (1.753 m)   Wt 199 lb (90.3 kg)   SpO2 100%   BMI 29.39 kg/m  GENERAL: Patient is a well appearing female in no acute distress HEENT:  Sclerae anicteric.  Oropharynx clear and moist. No ulcerations or evidence of oropharyngeal candidiasis.  Neck is supple.  NODES:  No cervical, supraclavicular, or axillary lymphadenopathy palpated.  BREAST EXAM: Left breast status postlumpectomy and radiation no sign of local recurrence right breast is benign. LUNGS:  Clear to auscultation bilaterally.  No wheezes or rhonchi. HEART:  Regular rate and rhythm. No murmur appreciated. ABDOMEN:  Soft, nontender.  Positive, normoactive bowel sounds. No organomegaly palpated. MSK:  No focal spinal tenderness to palpation. Full range of  motion bilaterally in the upper extremities. EXTREMITIES:  No peripheral edema.   SKIN:  Clear with no obvious rashes or skin changes. No nail dyscrasia. NEURO:  Nonfocal. Well oriented.  Appropriate affect.   LABORATORY DATA:  None for this visit.  DIAGNOSTIC IMAGING:  None for this visit.      ASSESSMENT AND PLAN:  Sydney Lee is a pleasant 62 y.o. female with Stage IIB left breast invasive ductal carcinoma, ER-/PR-/HER2-, diagnosed in 04/2022, treated with neoadjvuant chemotherapy, lumpectomy, and adjuvant radiation therapy.  She presents to the Survivorship Clinic for our initial meeting and routine follow-up post-completion of treatment for breast cancer.    1. Stage IIB left breast cancer:  Sydney Lee is continuing to recover from definitive treatment for breast cancer. She will follow-up with her medical oncologist, Dr.  Al Pimple in 6 months with history and physical exam per surveillance protocol.  Her mammogram is due 06/2024; orders placed today.   Today, a comprehensive survivorship care plan and treatment summary was reviewed with the patient today detailing her breast cancer diagnosis, treatment course, potential late/long-term effects of treatment, appropriate follow-up care with recommendations for the future, and patient education resources.  A copy of this summary, along with a letter will be sent to the patient's primary care provider via mail/fax/In Basket message after today's visit.    2. Bone health:   She was given education on specific activities to promote bone health.  3. Previous abnormal CT chest: She underwent CT chest in October 2023 that demonstrated pneumonia.  Due to the appearance of the pneumonia on the CT scan and her triple negative breast cancer she is recommended to undergo CT chest in January 2024.  This was never completed therefore we are ordering repeat CT chest today to follow-up on this area.  Of note she has a 20-pack-year tobacco history and she quit smoking 9 months ago.  4. Cancer screening:  Due to Sydney Lee's history and her age, she should receive screening for skin cancers, colon cancer, lung cancer and gynecologic cancers.  The information and recommendations are listed on the patient's comprehensive care plan/treatment summary and were reviewed in detail with the patient.  I recommended she f/u with her PCP about lung cancer screening.  5. Health maintenance and wellness promotion: Sydney Lee was encouraged to consume 5-7 servings of fruits and vegetables per day. We reviewed the "Nutrition Rainbow" handout.  She was also encouraged to engage in moderate to vigorous exercise for 30 minutes per day most days of the week.  She was instructed to limit her alcohol consumption and continue to abstain from tobacco use.     6. Support services/counseling: It is not uncommon for this period of the patient's cancer care trajectory to be one of many emotions and stressors.   She was given information regarding our available services and encouraged to contact me with any questions or for help enrolling in any of our support group/programs.    Follow up instructions:    -Return to cancer center in 3 weeks to review CT chest results -Mammogram due in 06/2024 -CT chest follow-up. -She is welcome to return back to the Survivorship Clinic at any time; no additional follow-up needed at this time.  -Consider referral back to survivorship as a long-term survivor for continued  surveillance  The patient was provided an opportunity to ask questions and all were answered. The patient agreed with the plan and demonstrated an understanding of the instructions.   Total encounter  time:45 minutes*in face-to-face visit time, chart review, lab review, care coordination, order entry, and documentation of the encounter time.    Lillard Anes, NP 07/28/23 3:21 PM Medical Oncology and Hematology [facility] [address] [place], [place] [address] Tel. [phone]    Fax. [phone]  *Total Encounter Time as defined by the Centers for Medicare and Medicaid Services includes, in addition to the face-to-face time of a patient visit (documented in the note above) non-face-to-face time: obtaining and reviewing outside history, ordering and reviewing medications, tests or procedures, care coordination (communications with other health care professionals or caregivers) and documentation in the medical record.

## 2023-07-29 LAB — T4: T4, Total: 6.2 ug/dL (ref 4.5–12.0)

## 2023-07-31 ENCOUNTER — Other Ambulatory Visit: Payer: BC Managed Care – PPO

## 2023-07-31 ENCOUNTER — Encounter: Payer: BC Managed Care – PPO | Admitting: Adult Health

## 2023-08-08 ENCOUNTER — Other Ambulatory Visit: Payer: Self-pay | Admitting: General Surgery

## 2023-08-17 ENCOUNTER — Ambulatory Visit (HOSPITAL_COMMUNITY)
Admission: RE | Admit: 2023-08-17 | Discharge: 2023-08-17 | Disposition: A | Discharge status: Home / Self Care | Payer: BC Managed Care – PPO | Source: Ambulatory Visit | Attending: Adult Health | Admitting: Adult Health

## 2023-08-17 DIAGNOSIS — C50212 Malignant neoplasm of upper-inner quadrant of left female breast: Secondary | ICD-10-CM | POA: Insufficient documentation

## 2023-08-17 DIAGNOSIS — R9389 Abnormal findings on diagnostic imaging of other specified body structures: Secondary | ICD-10-CM | POA: Insufficient documentation

## 2023-08-17 DIAGNOSIS — Z171 Estrogen receptor negative status [ER-]: Secondary | ICD-10-CM | POA: Insufficient documentation

## 2023-08-17 MED ORDER — SODIUM CHLORIDE (PF) 0.9 % IJ SOLN
INTRAMUSCULAR | Status: AC
  Filled 2023-08-17: qty 50

## 2023-08-17 MED ORDER — IOHEXOL 300 MG/ML  SOLN
75.0000 mL | Freq: Once | INTRAMUSCULAR | Status: AC | PRN
  Administered 2023-08-17: 75 mL via INTRAVENOUS

## 2023-08-31 NOTE — Patient Instructions (Addendum)
SURGICAL WAITING ROOM VISITATION Patients having surgery or a procedure may have no more than 2 support people in the waiting area - these visitors may rotate.    Children under the age of 53 must have an adult with them who is not the patient.  If the patient needs to stay at the hospital during part of their recovery, the visitor guidelines for inpatient rooms apply. Pre-op nurse will coordinate an appropriate time for 1 support person to accompany patient in pre-op.  This support person may not rotate.    Please refer to the [facility] website for the visitor guidelines for Inpatients (after your surgery is over and you are in a regular room).       Your procedure is scheduled on: 09-06-23   Report to [facility] Main Entrance    Report to admitting at 10:45 AM   Call this number if you have problems the morning of surgery [phone]   Do not eat food :After Midnight.   After Midnight you may have the following liquids until 10:00 AM DAY OF SURGERY  Water Non-Citrus Juices (without pulp, NO RED-Apple, White grape, White cranberry) Black Coffee (NO MILK/CREAM OR CREAMERS, sugar ok)  Clear Tea (NO MILK/CREAM OR CREAMERS, sugar ok) regular and decaf                             Plain Jell-O (NO RED)                                           Fruit ices (not with fruit pulp, NO RED)                                     Popsicles (NO RED)                                                               Sports drinks like Gatorade (NO RED)                       If you have questions, please contact your surgeon's office.   FOLLOW  ANY ADDITIONAL PRE OP INSTRUCTIONS YOU RECEIVED FROM YOUR SURGEON'S OFFICE!!!     Oral Hygiene is also important to reduce your risk of infection.                                    Remember - BRUSH YOUR TEETH THE MORNING OF SURGERY WITH YOUR REGULAR TOOTHPASTE   Do NOT smoke after Midnight   Take these medicines the morning of surgery with A  SIP OF WATER:   Atorvastatin  Duloxetine  Claritin  Metoprolol  If needed Tylenol, Alprazolam, Meclizine  Stop all vitamins and herbal supplements 7 days before surgery  How to Manage Your Diabetes Before and After Surgery  Why is it important to control my blood sugar before and after surgery? Improving blood sugar levels before and after surgery helps healing and  can limit problems. A way of improving blood sugar control is eating a healthy diet by:  Eating less sugar and carbohydrates  Increasing activity/exercise  Talking with your doctor about reaching your blood sugar goals High blood sugars (greater than 180 mg/dL) can raise your risk of infections and slow your recovery, so you will need to focus on controlling your diabetes during the weeks before surgery. Make sure that the doctor who takes care of your diabetes knows about your planned surgery including the date and location.  How do I manage my blood sugar before surgery? Check your blood sugar at least 4 times a day, starting 2 days before surgery, to make sure that the level is not too high or low. Check your blood sugar the morning of your surgery when you wake up and every 2 hours until you get to the Short Stay unit. If your blood sugar is less than 70 mg/dL, you will need to treat for low blood sugar: Do not take insulin. Treat a low blood sugar (less than 70 mg/dL) with  cup of clear juice (cranberry or apple), 4 glucose tablets, OR glucose gel. Recheck blood sugar in 15 minutes after treatment (to make sure it is greater than 70 mg/dL). If your blood sugar is not greater than 70 mg/dL on recheck, call [phone] for further instructions. Report your blood sugar to the short stay nurse when you get to Short Stay.  If you are admitted to the hospital after surgery: Your blood sugar will be checked by the staff and you will probably be given insulin after surgery (instead of oral diabetes medicines) to make sure you  have good blood sugar levels. The goal for blood sugar control after surgery is 80-180 mg/dL.   WHAT DO I DO ABOUT MY DIABETES MEDICATION?  Do not take oral diabetes medicines (pills) the morning of surgery.  Hold Ozempic 7 days before surgery (do not take after 08-29-23)       Hold Jardiance 3 days before surgery (do not take after 09-02-23)  DO NOT TAKE THE FOLLOWING 7 DAYS PRIOR TO SURGERY: Ozempic, Wegovy, Rybelsus (Semaglutide), Byetta (exenatide), Bydureon (exenatide ER), Victoza, Saxenda (liraglutide), or Trulicity (dulaglutide) Mounjaro (Tirzepatide) Adlyxin (Lixisenatide), Polyethylene Glycol Loxenatide.  Reviewed and Endorsed by [facility] Patient Education Committee, August 2015                              You may not have any metal on your body including hair pins, jewelry, and body piercing             Do not wear make-up, lotions, powders, perfumes or deodorant  Do not wear nail polish including gel and S&S, artificial/acrylic nails, or any other type of covering on natural nails including finger and toenails. If you have artificial nails, gel coating, etc. that needs to be removed by a nail salon please have this removed prior to surgery or surgery may need to be canceled/ delayed if the surgeon/ anesthesia feels like they are unable to be safely monitored.   Do not shave  48 hours prior to surgery.      Do not bring valuables to the hospital. [facility] IS NOT RESPONSIBLE   FOR VALUABLES.   Contacts, dentures or bridgework may not be worn into surgery.   Bring small overnight bag day of surgery.   DO NOT BRING YOUR HOME MEDICATIONS TO THE HOSPITAL. PHARMACY WILL DISPENSE MEDICATIONS  LISTED ON YOUR MEDICATION LIST TO YOU DURING YOUR ADMISSION IN THE HOSPITAL!    Patients discharged on the day of surgery will not be allowed to drive home.  Someone NEEDS to stay with you for the first 24 hours after anesthesia.               Please read over the following fact sheets  you were given: IF YOU HAVE QUESTIONS ABOUT YOUR PRE-OP INSTRUCTIONS PLEASE CALL [phone] Gwen  If you received a COVID test during your pre-op visit  it is requested that you wear a mask when out in public, stay away from anyone that may not be feeling well and notify your surgeon if you develop symptoms. If you test positive for Covid or have been in contact with anyone that has tested positive in the last 10 days please notify you surgeon.  [facility] - Preparing for Surgery Before surgery, you can play an important role.  Because skin is not sterile, your skin needs to be as free of germs as possible.  You can reduce the number of germs on your skin by washing with CHG (chlorahexidine gluconate) soap before surgery.  CHG is an antiseptic cleaner which kills germs and bonds with the skin to continue killing germs even after washing. Please DO NOT use if you have an allergy to CHG or antibacterial soaps.  If your skin becomes reddened/irritated stop using the CHG and inform your nurse when you arrive at Short Stay. Do not shave (including legs and underarms) for at least 48 hours prior to the first CHG shower.  You may shave your face/neck.  Please follow these instructions carefully:  1.  Shower with CHG Soap the night before surgery and the  morning of surgery.  2.  If you choose to wash your hair, wash your hair first as usual with your normal  shampoo.  3.  After you shampoo, rinse your hair and body thoroughly to remove the shampoo.                             4.  Use CHG as you would any other liquid soap.  You can apply chg directly to the skin and wash.  Gently with a scrungie or clean washcloth.  5.  Apply the CHG Soap to your body ONLY FROM THE NECK DOWN.   Do   not use on face/ open                           Wound or open sores. Avoid contact with eyes, ears mouth and   genitals (private parts).                       Wash face,  Genitals (private parts) with your normal soap.              6.  Wash thoroughly, paying special attention to the area where your    surgery  will be performed.  7.  Thoroughly rinse your body with warm water from the neck down.  8.  DO NOT shower/wash with your normal soap after using and rinsing off the CHG Soap.                9.  Pat yourself dry with a clean towel.            10.  Wear  clean pajamas.            11.  Place clean sheets on your bed the night of your first shower and do not  sleep with pets. Day of Surgery : Do not apply any lotions/deodorants the morning of surgery.  Please wear clean clothes to the hospital/surgery center.  FAILURE TO FOLLOW THESE INSTRUCTIONS MAY RESULT IN THE CANCELLATION OF YOUR SURGERY  PATIENT SIGNATURE_________________________________  NURSE SIGNATURE__________________________________  ________________________________________________________________________

## 2023-08-31 NOTE — Progress Notes (Addendum)
COVID Vaccine Completed:  Date of COVID positive in last 90 days:  No  PCP - [name], MD Cardiologist - Arvilla Meres, MD  Chest x-ray - 09-15-22 Epic EKG - 10-17-22 Epic Stress Test - N/A ECHO - 03-30-23 Epic Cardiac Cath - N/A Pacemaker/ICD device last checked: Spinal Cord Stimulator:  N/A Cardiac MR 0 11-16-22 Epic  Bowel Prep - N/A  Sleep Study - N/A CPAP -   Fasting Blood Sugar - 70 (rare) to 120 Checks Blood Sugar -  2 times a day  Ozempic Last dose of GLP1 agonist- 08-27-23 GLP1 instructions: do not take after 08-29-23   Jardiance Last dose of SGLT-2 inhibitors-   SGLT-2 instructions: do not take after 09-02-23   Blood Thinner Instructions:  N/A Aspirin Instructions: Last Dose:  Activity level:  Can go up a flight of stairs and perform activities of daily living without stopping and without symptoms of chest pain or shortness of breath.  Patient states that she has some limitations due to knee pain.   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 Patient was also instructed that they will need to review over the PAT instructions again at home before surgery.

## 2023-09-01 ENCOUNTER — Other Ambulatory Visit: Payer: Self-pay

## 2023-09-01 ENCOUNTER — Encounter (HOSPITAL_COMMUNITY): Payer: Self-pay

## 2023-09-01 ENCOUNTER — Encounter (HOSPITAL_COMMUNITY)
Admission: RE | Admit: 2023-09-01 | Discharge: 2023-09-01 | Disposition: A | Discharge status: Home / Self Care | Payer: BC Managed Care – PPO | Source: Ambulatory Visit | Attending: General Surgery | Admitting: General Surgery

## 2023-09-01 VITALS — BP 117/77 | HR 83 | Temp 98.8°F | Resp 16 | Ht 69.0 in | Wt 198.0 lb

## 2023-09-01 DIAGNOSIS — I427 Cardiomyopathy due to drug and external agent: Secondary | ICD-10-CM | POA: Insufficient documentation

## 2023-09-01 DIAGNOSIS — I10 Essential (primary) hypertension: Secondary | ICD-10-CM | POA: Diagnosis not present

## 2023-09-01 DIAGNOSIS — Z79899 Other long term (current) drug therapy: Secondary | ICD-10-CM | POA: Diagnosis not present

## 2023-09-01 DIAGNOSIS — T451X5A Adverse effect of antineoplastic and immunosuppressive drugs, initial encounter: Secondary | ICD-10-CM | POA: Diagnosis not present

## 2023-09-01 DIAGNOSIS — Z01812 Encounter for preprocedural laboratory examination: Secondary | ICD-10-CM | POA: Diagnosis not present

## 2023-09-01 DIAGNOSIS — C50912 Malignant neoplasm of unspecified site of left female breast: Secondary | ICD-10-CM | POA: Insufficient documentation

## 2023-09-01 DIAGNOSIS — E119 Type 2 diabetes mellitus without complications: Secondary | ICD-10-CM | POA: Diagnosis not present

## 2023-09-01 DIAGNOSIS — Z923 Personal history of irradiation: Secondary | ICD-10-CM | POA: Diagnosis not present

## 2023-09-01 DIAGNOSIS — D649 Anemia, unspecified: Secondary | ICD-10-CM | POA: Insufficient documentation

## 2023-09-01 DIAGNOSIS — Z01818 Encounter for other preprocedural examination: Secondary | ICD-10-CM | POA: Diagnosis present

## 2023-09-01 HISTORY — DX: Pneumonia, unspecified organism: J18.9

## 2023-09-01 LAB — BASIC METABOLIC PANEL
Anion gap: 8 (ref 5–15)
BUN: 13 mg/dL (ref 8–23)
CO2: 25 mmol/L (ref 22–32)
Calcium: 9 mg/dL (ref 8.9–10.3)
Chloride: 104 mmol/L (ref 98–111)
Creatinine, Ser: 0.69 mg/dL (ref 0.44–1.00)
GFR, Estimated: >60 mL/min (ref >60)
Glucose, Bld: 116 mg/dL — ABNORMAL HIGH (ref 70–99)
Potassium: 3.8 mmol/L (ref 3.5–5.1)
Sodium: 137 mmol/L (ref 135–145)

## 2023-09-01 LAB — GLUCOSE, CAPILLARY: Glucose-Capillary: 117 mg/dL — ABNORMAL HIGH (ref 70–99)

## 2023-09-01 LAB — CBC
HCT: 38.6 % (ref 36.0–46.0)
Hemoglobin: 12.4 g/dL (ref 12.0–15.0)
MCH: 30.4 pg (ref 26.0–34.0)
MCHC: 32.1 g/dL (ref 30.0–36.0)
MCV: 94.6 fL (ref 80.0–100.0)
Platelets: 206 10*3/uL (ref 150–400)
RBC: 4.08 MIL/uL (ref 3.87–5.11)
RDW: 13.6 % (ref 11.5–15.5)
WBC: 3.4 10*3/uL — ABNORMAL LOW (ref 4.0–10.5)
nRBC: 0 % (ref 0.0–0.2)

## 2023-09-01 LAB — HEMOGLOBIN A1C
Hgb A1c MFr Bld: 6.8 % — ABNORMAL HIGH (ref 4.8–5.6)
Mean Plasma Glucose: 148.46 mg/dL

## 2023-09-04 NOTE — Progress Notes (Signed)
Anesthesia Chart Review   Case: [id number] Date/Time: 09/06/23 1245   Procedure: REMOVAL PORT-A-CATH   Anesthesia type: Monitor Anesthesia Care   Pre-op diagnosis: PORT IN PLACE   Location: [facility] ROOM 01 / [facility]   Surgeons: Almond Lint, MD       DISCUSSION:62 y.o. former smoker with h/o HTN, DM II, cardiomyopathy, breast cancer, port-a-cath in place scheduled for above procedure 09/06/23 with Dr. Almond Lint.   Pt followed by cardiology for chemo induced cardiomyopathy.  Updated Echo 03/30/2023 with EF 50-55%, improved from previous echo. Asymptomatic at this visit.  VS: BP 117/77   Pulse 83   Temp 37.1 C (Oral)   Resp 16   Ht 5\' 9"  (1.753 m)   Wt 89.8 kg   SpO2 100%   BMI 29.24 kg/m   PROVIDERS: [name], MD is PCP   Cardiologist - Arvilla Meres, MD   LABS: Labs reviewed: Acceptable for surgery. (all labs ordered are listed, but only abnormal results are displayed)  Labs Reviewed  HEMOGLOBIN A1C - Abnormal; Notable for the following components:      Result Value   Hgb A1c MFr Bld 6.8 (*)    All other components within normal limits  BASIC METABOLIC PANEL - Abnormal; Notable for the following components:   Glucose, Bld 116 (*)    All other components within normal limits  CBC - Abnormal; Notable for the following components:   WBC 3.4 (*)    All other components within normal limits  GLUCOSE, CAPILLARY - Abnormal; Notable for the following components:   Glucose-Capillary 117 (*)    All other components within normal limits     IMAGES:   EKG:   CV: Echo 03/30/23 1. GLS borderline. Left ventricular ejection fraction, by estimation, is  50 to 55%. The left ventricle has low normal function. The left ventricle  has no regional wall motion abnormalities. Left ventricular diastolic  parameters are indeterminate. The  average left ventricular global longitudinal strain is -17.0 %.   2. Right ventricular systolic function is normal. The right ventricular   size is normal. There is normal pulmonary artery systolic pressure.   3. No evidence of mitral valve regurgitation.   4. The aortic valve is normal in structure. Aortic valve regurgitation is  not visualized.   5. The inferior vena cava is normal in size with greater than 50%  respiratory variability, suggesting right atrial pressure of 3 mmHg.   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 Antony Blackbird   Hypertension    Neuromuscular disorder [place])    neuropathy hands/feets   Personal history of chemotherapy    Personal history of radiation therapy    Pneumonia     Past Surgical History:  Procedure Laterality Date   BREAST BIOPSY  12/13/2022   MM LT RADIOACTIVE SEED LOC MAMMO GUIDE 12/13/2022 [facility] MAMMOGRAPHY   BREAST LUMPECTOMY Left 12/2022   BREAST LUMPECTOMY WITH RADIOACTIVE SEED AND SENTINEL LYMPH NODE BIOPSY Left 12/15/2022   Procedure: LEFT BREAST LUMPECTOMY WITH RADIOACTIVE SEED AND SENTINEL LYMPH NODE BIOPSY;  Surgeon: Almond Lint, MD;  Location: [facility];  Service: General;  Laterality: Left;   PORTACATH PLACEMENT N/A 06/01/2022  Procedure: INSERTION PORT-A-CATH WITH ULTRASOUND GUIDANCE;  Surgeon: Almond Lint, MD;  Location: [facility];  Service: General;  Laterality: N/A;    MEDICATIONS:  acetaminophen (TYLENOL) 500 MG tablet   ALPRAZolam (XANAX) 0.25 MG tablet   atorvastatin (LIPITOR) 10 MG tablet   DULoxetine (CYMBALTA) 30 MG capsule   JARDIANCE 10 MG TABS tablet   lidocaine-prilocaine (EMLA) cream   loratadine (CLARITIN) 10 MG tablet   losartan (COZAAR) 100 MG tablet   meclizine (ANTIVERT) 25 MG tablet   metFORMIN (GLUCOPHAGE) 1000 MG tablet   metoprolol succinate (TOPROL-XL) 50 MG 24 hr  tablet   ondansetron (ZOFRAN) 8 MG tablet   OZEMPIC, 1 MG/DOSE, 4 MG/3ML SOPN   No current facility-administered medications for this encounter.     Jodell Cipro Ward, PA-C [facility]Surgical [facility] [phone]

## 2023-09-06 ENCOUNTER — Encounter (HOSPITAL_COMMUNITY): Payer: Self-pay | Admitting: General Surgery

## 2023-09-06 ENCOUNTER — Ambulatory Visit (HOSPITAL_COMMUNITY): Payer: BC Managed Care – PPO | Admitting: Anesthesiology

## 2023-09-06 ENCOUNTER — Ambulatory Visit (HOSPITAL_COMMUNITY)
Admission: RE | Admit: 2023-09-06 | Discharge: 2023-09-06 | Disposition: A | Discharge status: Home / Self Care | Payer: BC Managed Care – PPO | Source: Ambulatory Visit | Attending: General Surgery | Admitting: General Surgery

## 2023-09-06 ENCOUNTER — Ambulatory Visit (HOSPITAL_COMMUNITY): Payer: BC Managed Care – PPO | Admitting: Physician Assistant

## 2023-09-06 ENCOUNTER — Encounter (HOSPITAL_COMMUNITY)
Admission: RE | Disposition: A | Discharge status: Home / Self Care | Payer: Self-pay | Source: Ambulatory Visit | Attending: General Surgery

## 2023-09-06 DIAGNOSIS — F419 Anxiety disorder, unspecified: Secondary | ICD-10-CM | POA: Diagnosis not present

## 2023-09-06 DIAGNOSIS — Z79899 Other long term (current) drug therapy: Secondary | ICD-10-CM | POA: Insufficient documentation

## 2023-09-06 DIAGNOSIS — I10 Essential (primary) hypertension: Secondary | ICD-10-CM | POA: Insufficient documentation

## 2023-09-06 DIAGNOSIS — Z87891 Personal history of nicotine dependence: Secondary | ICD-10-CM | POA: Insufficient documentation

## 2023-09-06 DIAGNOSIS — Z923 Personal history of irradiation: Secondary | ICD-10-CM | POA: Diagnosis not present

## 2023-09-06 DIAGNOSIS — Z7985 Long-term (current) use of injectable non-insulin antidiabetic drugs: Secondary | ICD-10-CM | POA: Diagnosis not present

## 2023-09-06 DIAGNOSIS — Z9221 Personal history of antineoplastic chemotherapy: Secondary | ICD-10-CM | POA: Diagnosis not present

## 2023-09-06 DIAGNOSIS — Z7984 Long term (current) use of oral hypoglycemic drugs: Secondary | ICD-10-CM | POA: Diagnosis not present

## 2023-09-06 DIAGNOSIS — F32A Depression, unspecified: Secondary | ICD-10-CM | POA: Diagnosis not present

## 2023-09-06 DIAGNOSIS — C50912 Malignant neoplasm of unspecified site of left female breast: Secondary | ICD-10-CM | POA: Diagnosis present

## 2023-09-06 DIAGNOSIS — E119 Type 2 diabetes mellitus without complications: Secondary | ICD-10-CM | POA: Insufficient documentation

## 2023-09-06 HISTORY — PX: PORT-A-CATH REMOVAL: SHX5289

## 2023-09-06 LAB — GLUCOSE, CAPILLARY
Glucose-Capillary: 129 mg/dL — ABNORMAL HIGH (ref 70–99)
Glucose-Capillary: 107 mg/dL — ABNORMAL HIGH (ref 70–99)

## 2023-09-06 SURGERY — REMOVAL PORT-A-CATH
Anesthesia: Monitor Anesthesia Care | Site: Chest | Laterality: Left

## 2023-09-06 MED ORDER — FENTANYL CITRATE (PF) 100 MCG/2ML IJ SOLN
INTRAMUSCULAR | Status: AC
  Filled 2023-09-06: qty 2

## 2023-09-06 MED ORDER — BUPIVACAINE-EPINEPHRINE 0.25% -1:200000 IJ SOLN
INTRAMUSCULAR | Status: AC
  Filled 2023-09-06: qty 1

## 2023-09-06 MED ORDER — FENTANYL CITRATE PF 50 MCG/ML IJ SOSY: 25.0000 ug | PREFILLED_SYRINGE | INTRAMUSCULAR | Status: DC | PRN

## 2023-09-06 MED ORDER — ACETAMINOPHEN 10 MG/ML IV SOLN: 1000.0000 mg | Freq: Once | INTRAVENOUS | Status: DC | PRN

## 2023-09-06 MED ORDER — CHLORHEXIDINE GLUCONATE CLOTH 2 % EX PADS: 6.0000 | MEDICATED_PAD | Freq: Once | CUTANEOUS | Status: DC

## 2023-09-06 MED ORDER — PROPOFOL 1000 MG/100ML IV EMUL
INTRAVENOUS | Status: AC
  Filled 2023-09-06: qty 100

## 2023-09-06 MED ORDER — FENTANYL CITRATE (PF) 100 MCG/2ML IJ SOLN
INTRAMUSCULAR | Status: DC | PRN
  Administered 2023-09-06 (×2): 50 ug via INTRAVENOUS

## 2023-09-06 MED ORDER — PROPOFOL 10 MG/ML IV BOLUS
INTRAVENOUS | Status: AC
  Filled 2023-09-06: qty 20

## 2023-09-06 MED ORDER — DEXAMETHASONE SODIUM PHOSPHATE 10 MG/ML IJ SOLN
INTRAMUSCULAR | Status: AC
  Filled 2023-09-06: qty 1

## 2023-09-06 MED ORDER — INSULIN ASPART 100 UNIT/ML IJ SOLN: 0.0000 [IU] | INTRAMUSCULAR | Status: DC | PRN

## 2023-09-06 MED ORDER — LACTATED RINGERS IV SOLN: INTRAVENOUS | Status: DC

## 2023-09-06 MED ORDER — LIDOCAINE HCL (PF) 2 % IJ SOLN
INTRAMUSCULAR | Status: AC
  Filled 2023-09-06: qty 5

## 2023-09-06 MED ORDER — LIDOCAINE 2% (20 MG/ML) 5 ML SYRINGE
INTRAMUSCULAR | Status: DC | PRN
  Administered 2023-09-06: 100 mg via INTRAVENOUS

## 2023-09-06 MED ORDER — MIDAZOLAM HCL 5 MG/5ML IJ SOLN
INTRAMUSCULAR | Status: DC | PRN
  Administered 2023-09-06: 2 mg via INTRAVENOUS

## 2023-09-06 MED ORDER — ONDANSETRON HCL 4 MG/2ML IJ SOLN
INTRAMUSCULAR | Status: AC
  Filled 2023-09-06: qty 2

## 2023-09-06 MED ORDER — PROPOFOL 500 MG/50ML IV EMUL
INTRAVENOUS | Status: DC | PRN
  Administered 2023-09-06: 100 ug/kg/min via INTRAVENOUS

## 2023-09-06 MED ORDER — CEFAZOLIN SODIUM-DEXTROSE 2-4 GM/100ML-% IV SOLN
2.0000 g | INTRAVENOUS | Status: AC
  Administered 2023-09-06: 2 g via INTRAVENOUS
  Filled 2023-09-06: qty 100

## 2023-09-06 MED ORDER — OXYCODONE HCL 5 MG PO TABS: 2.5000 mg | ORAL_TABLET | Freq: Four times a day (QID) | ORAL | 0 refills | Status: DC | PRN

## 2023-09-06 MED ORDER — LIDOCAINE HCL (PF) 1 % IJ SOLN
INTRAMUSCULAR | Status: DC | PRN
  Administered 2023-09-06: 5 mL

## 2023-09-06 MED ORDER — ORAL CARE MOUTH RINSE: 15.0000 mL | Freq: Once | OROMUCOSAL | Status: AC

## 2023-09-06 MED ORDER — BUPIVACAINE-EPINEPHRINE 0.25% -1:200000 IJ SOLN
INTRAMUSCULAR | Status: DC | PRN
  Administered 2023-09-06: 5 mL

## 2023-09-06 MED ORDER — CHLORHEXIDINE GLUCONATE 0.12 % MT SOLN
15.0000 mL | Freq: Once | OROMUCOSAL | Status: AC
  Administered 2023-09-06: 15 mL via OROMUCOSAL

## 2023-09-06 MED ORDER — LIDOCAINE HCL (PF) 1 % IJ SOLN
INTRAMUSCULAR | Status: AC
  Filled 2023-09-06: qty 30

## 2023-09-06 MED ORDER — ONDANSETRON HCL 4 MG/2ML IJ SOLN
INTRAMUSCULAR | Status: DC | PRN
Start: 2023-09-06 — End: 2023-09-06
  Administered 2023-09-06: 4 mg via INTRAVENOUS

## 2023-09-06 MED ORDER — ACETAMINOPHEN 500 MG PO TABS
1000.0000 mg | ORAL_TABLET | ORAL | Status: AC
  Administered 2023-09-06: 1000 mg via ORAL
  Filled 2023-09-06: qty 2

## 2023-09-06 MED ORDER — MIDAZOLAM HCL 2 MG/2ML IJ SOLN
INTRAMUSCULAR | Status: AC
  Filled 2023-09-06: qty 2

## 2023-09-06 MED ORDER — 0.9 % SODIUM CHLORIDE (POUR BTL) OPTIME
TOPICAL | Status: DC | PRN
Start: 2023-09-06 — End: 2023-09-06
  Administered 2023-09-06: 1000 mL

## 2023-09-06 SURGICAL SUPPLY — 38 items
ADH SKN CLS APL DERMABOND .7 (GAUZE/BANDAGES/DRESSINGS) ×1
APL PRP STRL LF DISP 70% ISPRP (MISCELLANEOUS) ×1
APL SKNCLS STERI-STRIP NONHPOA (GAUZE/BANDAGES/DRESSINGS) ×1
BAG COUNTER SPONGE SURGICOUNT (BAG) IMPLANT
BAG SPNG CNTER NS LX DISP (BAG)
BENZOIN TINCTURE PRP APPL 2/3 (GAUZE/BANDAGES/DRESSINGS) ×1 IMPLANT
BLADE SURG 15 STRL LF DISP TIS (BLADE) ×1 IMPLANT
BLADE SURG 15 STRL SS (BLADE) ×1
CHLORAPREP W/TINT 26 (MISCELLANEOUS) ×1 IMPLANT
COVER SURGICAL LIGHT HANDLE (MISCELLANEOUS) ×1 IMPLANT
DERMABOND ADVANCED .7 DNX12 (GAUZE/BANDAGES/DRESSINGS) IMPLANT
DRAPE LAPAROTOMY TRNSV 102X78 (DRAPES) IMPLANT
ELECT COATED BLADE 2.86 ST (ELECTRODE) IMPLANT
ELECT REM PT RETURN 15FT ADLT (MISCELLANEOUS) ×1 IMPLANT
GAUZE 4X4 16PLY ~~LOC~~+RFID DBL (SPONGE) IMPLANT
GAUZE SPONGE 4X4 12PLY STRL (GAUZE/BANDAGES/DRESSINGS) ×1 IMPLANT
GLOVE BIO SURGEON STRL SZ 6 (GLOVE) ×1 IMPLANT
GLOVE BIOGEL PI IND STRL 6.5 (GLOVE) ×1 IMPLANT
GLOVE BIOGEL PI IND STRL 7.0 (GLOVE) ×1 IMPLANT
GLOVE INDICATOR 6.5 STRL GRN (GLOVE) ×2 IMPLANT
GOWN STRL REUS W/ TWL XL LVL3 (GOWN DISPOSABLE) ×1 IMPLANT
GOWN STRL REUS W/TWL XL LVL3 (GOWN DISPOSABLE) ×1
KIT BASIN OR (CUSTOM PROCEDURE TRAY) ×1 IMPLANT
KIT TURNOVER KIT A (KITS) IMPLANT
MARKER SKIN DUAL TIP RULER LAB (MISCELLANEOUS) ×1 IMPLANT
NDL HYPO 22X1.5 SAFETY MO (MISCELLANEOUS) IMPLANT
NDL HYPO 25X1 1.5 SAFETY (NEEDLE) ×1 IMPLANT
NEEDLE HYPO 22X1.5 SAFETY MO (MISCELLANEOUS)
NEEDLE HYPO 25X1 1.5 SAFETY (NEEDLE) ×1
PACK BASIC VI WITH GOWN DISP (CUSTOM PROCEDURE TRAY) ×1 IMPLANT
PENCIL SMOKE EVACUATOR (MISCELLANEOUS) IMPLANT
SPIKE FLUID TRANSFER (MISCELLANEOUS) ×1 IMPLANT
STRIP CLOSURE SKIN 1/2X4 (GAUZE/BANDAGES/DRESSINGS) ×1 IMPLANT
SUT MNCRL AB 4-0 PS2 18 (SUTURE) ×1 IMPLANT
SUT VIC AB 3-0 SH 27 (SUTURE)
SUT VIC AB 3-0 SH 27X BRD (SUTURE) IMPLANT
SYR CONTROL 10ML LL (SYRINGE) ×1 IMPLANT
TOWEL OR 17X26 10 PK STRL BLUE (TOWEL DISPOSABLE) ×1 IMPLANT

## 2023-09-06 NOTE — Transfer of Care (Signed)
Immediate Anesthesia Transfer of Care Note  Patient: Sydney Lee  Procedure(s) Performed: REMOVAL PORT-A-CATH (Left: Chest)  Patient Location: PACU  Anesthesia Type:MAC  Level of Consciousness: drowsy  Airway & Oxygen Therapy: Patient Spontanous Breathing and Patient connected to face mask oxygen  Post-op Assessment: Report given to RN and Post -op Vital signs reviewed and stable  Post vital signs: Reviewed and stable  Last Vitals:  Vitals Value Taken Time  BP    Temp 36.6 C 09/06/23 1230  Pulse 71 09/06/23 1230  Resp 16 09/06/23 1230  SpO2 100 % 09/06/23 1230    Last Pain:  Vitals:   09/06/23 1110  TempSrc: Oral  PainSc:          Complications: No notable events documented.

## 2023-09-06 NOTE — Anesthesia Postprocedure Evaluation (Signed)
Anesthesia Post Note  Patient: Sydney Lee  Procedure(s) Performed: REMOVAL PORT-A-CATH (Left: Chest)     Patient location during evaluation: PACU Anesthesia Type: MAC Level of consciousness: awake and alert Pain management: pain level controlled Vital Signs Assessment: post-procedure vital signs reviewed and stable Respiratory status: spontaneous breathing, nonlabored ventilation, respiratory function stable and patient connected to nasal cannula oxygen Cardiovascular status: stable and blood pressure returned to baseline Postop Assessment: no apparent nausea or vomiting Anesthetic complications: no   No notable events documented.  Last Vitals:  Vitals:   09/06/23 1300 09/06/23 1306  BP: 115/71 136/86  Pulse: 65 60  Resp: 18   Temp: 36.4 C   SpO2: 94% 97%    Last Pain:  Vitals:   09/06/23 1306  TempSrc:   PainSc: 0-No pain                 Earl Lites P Ruford Dudzinski

## 2023-09-06 NOTE — Discharge Instructions (Addendum)
[facility] Office Phone Number [phone]   POST OP INSTRUCTIONS  Always review your discharge instruction sheet given to you by the facility where your surgery was performed.  IF YOU HAVE DISABILITY OR FAMILY LEAVE FORMS, YOU MUST BRING THEM TO THE OFFICE FOR PROCESSING.  DO NOT GIVE THEM TO YOUR DOCTOR.  Take 2 tylenol (acetominophen) three times a day for 3 days.  If you still have pain, add ibuprofen with food in between if able to take this (if you have kidney issues or stomach issues, do not take ibuprofen).  If both of those are not enough, add the narcotic pain pill.  If you find you are needing a lot of this overnight after surgery, call the next morning for a refill.   Take your usually prescribed medications unless otherwise directed If you need a refill on your pain medication, please contact your pharmacy.  They will contact our office to request authorization.  Prescriptions will not be filled after 5pm or on week-ends. You should eat very light the first 24 hours after surgery, such as soup, crackers, pudding, etc.  Resume your normal diet the day after surgery It is common to experience some constipation if taking pain medication after surgery.  Increasing fluid intake and taking a stool softener will usually help or prevent this problem from occurring.  A mild laxative (Milk of Magnesia or Miralax) should be taken according to package directions if there are no bowel movements after 48 hours. You may shower in 48 hours.  The surgical glue will flake off in 2-3 weeks.   ACTIVITIES:  No strenuous activity or heavy lifting for 1 week.   You may drive when you no longer are taking prescription pain medication, you can comfortably wear a seatbelt, and you can safely maneuver your car and apply brakes. RETURN TO WORK:  __________5-7 days_______________ Sydney Lee should see your doctor in the office for a follow-up appointment approximately three-four weeks after your  surgery.    WHEN TO CALL YOUR DOCTOR: Fever over 101.0 Nausea and/or vomiting. Extreme swelling or bruising. Continued bleeding from incision. Increased pain, redness, or drainage from the incision.  The clinic staff is available to answer your questions during regular business hours.  Please don't hesitate to call and ask to speak to one of the nurses for clinical concerns.  If you have a medical emergency, go to the nearest emergency room or call 911.  A surgeon from [facility] is always on call at the hospital.  For further questions, please visit [email or web address]

## 2023-09-06 NOTE — Anesthesia Preprocedure Evaluation (Addendum)
Anesthesia Evaluation  Patient identified by MRN, date of birth, ID band Patient awake    Reviewed: Allergy & Precautions, NPO status , Patient's Chart, lab work & pertinent test results  Airway Mallampati: II  TM Distance: >3 FB Neck ROM: Full    Dental no notable dental hx. (+) Upper Dentures, Partial Lower   Pulmonary former smoker   Pulmonary exam normal        Cardiovascular hypertension, Pt. on medications and Pt. on home beta blockers  Rhythm:Regular Rate:Normal     Neuro/Psych   Anxiety Depression    negative neurological ROS     GI/Hepatic negative GI ROS, Neg liver ROS,,,  Endo/Other  diabetes, Type 2, Oral Hypoglycemic Agents    Renal/GU   negative genitourinary   Musculoskeletal  (+) Arthritis , Osteoarthritis,    Abdominal Normal abdominal exam  (+)   Peds  Hematology Lab Results      Component                Value               Date                      WBC                      3.4 (L)             09/01/2023                HGB                      12.4                09/01/2023                HCT                      38.6                09/01/2023                MCV                      94.6                09/01/2023                PLT                      206                 09/01/2023             Lab Results      Component                Value               Date                      NA                       137                 09/01/2023                K  3.8                 09/01/2023                CO2                      25                  09/01/2023                GLUCOSE                  116 (H)             09/01/2023                BUN                      13                  09/01/2023                CREATININE               0.69                09/01/2023                CALCIUM                  9.0                 09/01/2023                GFRNONAA                  >60                 09/01/2023              Anesthesia Other Findings   Reproductive/Obstetrics                             Anesthesia Physical Anesthesia Plan  ASA: 2  Anesthesia Plan: MAC   Post-op Pain Management:    Induction: Intravenous  PONV Risk Score and Plan: 2 and Propofol infusion and Treatment may vary due to age or medical condition  Airway Management Planned: Simple Face Mask and Nasal Cannula  Additional Equipment: None  Intra-op Plan:   Post-operative Plan:   Informed Consent: I have reviewed the patients History and Physical, chart, labs and discussed the procedure including the risks, benefits and alternatives for the proposed anesthesia with the patient or authorized representative who has indicated his/her understanding and acceptance.     Dental advisory given  Plan Discussed with: CRNA  Anesthesia Plan Comments:        Anesthesia Quick Evaluation

## 2023-09-06 NOTE — H&P (Signed)
Sydney Lee is an 62 y.o. female.   Chief Complaint: left breast cancer, port in place HPI:  Pt has had left breast cancer.  Surgery, XRT, and chemo has been completed.  Pt desires port removal.    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 Antony Blackbird   Hypertension    Neuromuscular disorder [facility])    neuropathy hands/feets   Personal history of chemotherapy    Personal history of radiation therapy    Pneumonia     Past Surgical History:  Procedure Laterality Date   BREAST BIOPSY  12/13/2022   MM LT RADIOACTIVE SEED LOC MAMMO GUIDE 12/13/2022 [facility] MAMMOGRAP[facility]   BREAST LUMPECTOMY Left 12/2022   BREAST LUMPECTOMY WITH RADIOACTIVE SEED AND SENTINEL LYMPH NODE BIOPSY Left 12/15/2022   Procedure: LEFT BREAST LUMPECTOMY WITH RADIOACTIVE SEED AND SENTINEL LYMPH NODE BIOPSY;  Surgeon: Almond Lint, MD;  Location: [facility] OR;  Service: General;  Laterality: Left;   PORTACATH PLACEMENT N/A 06/01/2022   Procedure: INSERTION PORT-A-CATH WITH ULTRASOUND GUIDANCE;  Surgeon: Almond Lint, MD;  Location: [facility]S;  Service: General;  Laterality: N/A;    Family History  Problem Relation Age of Onset   Leukemia Mother 66   Throat cancer Maternal Uncle 85   Prostate cancer Maternal Uncle    Breast cancer Cousin 70       maternal first cousin   Ovarian cancer Cousin        maternal first cousin   Breast cancer Cousin 20 - 24       paternal first cousin   Breast cancer Cousin 76 - 66       paternal first cousin   Social History:  reports that she quit smoking about 15 months ago. Her smoking use included cigarettes. She started smoking about 41 years ago. She has a 40 pack-year smoking history. She has never used smokeless  tobacco. She reports that she does not drink alcohol and does not use drugs.  Allergies:  Allergies  Allergen Reactions   Emend [Aprepitant] Shortness Of Breath and Other (See Comments)    Flushing and shortness of breath    Codeine Nausea Only and Other (See Comments)    "Sick on the stomach"   Latex Itching and Other (See Comments)    Gloves=itching/burning    Medications Prior to Admission  Medication Sig Dispense Refill   acetaminophen (TYLENOL) 500 MG tablet Take 1,000 mg by mouth every 6 (six) hours as needed for moderate pain.     ALPRAZolam (XANAX) 0.25 MG tablet Take 1 tablet (0.25 mg total) by mouth 2 (two) times daily as needed for anxiety. 30 tablet 0   atorvastatin (LIPITOR) 10 MG tablet Take 10 mg by mouth every Monday, Wednesday, and Friday.     DULoxetine (CYMBALTA) 30 MG capsule Take 2 capsules (60 mg total) by mouth daily. 60 capsule 0   JARDIANCE 10 MG TABS tablet Take 10 mg by mouth in the morning.     loratadine (CLARITIN) 10 MG tablet Take 10 mg by mouth daily as needed for allergies or itching.     losartan (COZAAR) 100 MG tablet Take 0.5 tablets (50 mg total) by mouth in the morning.  meclizine (ANTIVERT) 25 MG tablet Take 25 mg by mouth 3 (three) times daily as needed for dizziness.     metFORMIN (GLUCOPHAGE) 1000 MG tablet Take 1,000 mg by mouth daily with breakfast.     metoprolol succinate (TOPROL-XL) 50 MG 24 hr tablet TAKE 1 TABLET(50 MG) BY MOUTH AT BEDTIME WITH OR IMMEDIATELY FOLLOWING A MEAL 30 tablet 5   OZEMPIC, 1 MG/DOSE, 4 MG/3ML SOPN Inject 1 mg into the skin every Wednesday.     lidocaine-prilocaine (EMLA) cream APPLY TOPICALLY TO THE AFFECTED AREA 1 TIME AS DIRECTED (Patient not taking: Reported on 08/29/2023) 30 g 3   ondansetron (ZOFRAN) 8 MG tablet Take 1 tablet (8 mg total) by mouth 2 (two) times daily as needed. Start on the third day after carboplatin and AC chemotherapy. (Patient not taking: Reported on 04/10/2023) 30 tablet 1    No results  found for this or any previous visit (from the past 48 hour(s)). No results found.  Review of Systems  All other systems reviewed and are negative.   Height 5\' 9"  (1.753 m), weight 89.8 kg. Physical Exam Constitutional:      Appearance: Normal appearance.  HENT:     Head: Normocephalic and atraumatic.     Mouth/Throat:     Mouth: Mucous membranes are moist.  Eyes:     General: No scleral icterus.    Conjunctiva/sclera: Conjunctivae normal.     Pupils: Pupils are equal, round, and reactive to light.  Cardiovascular:     Rate and Rhythm: Normal rate.  Pulmonary:     Effort: Pulmonary effort is normal.     Comments: Left chest wall port in place.  Abdominal:     General: Abdomen is flat.  Musculoskeletal:     Cervical back: Neck supple.  Skin:    General: Skin is warm and dry.  Neurological:     General: No focal deficit present.     Mental Status: She is alert and oriented to person, place, and time.  Psychiatric:        Mood and Affect: Mood normal.        Behavior: Behavior normal.        Thought Content: Thought content normal.        Judgment: Judgment normal.      Assessment/Plan Left breast cancer Port in place.   Port removal discussed with patient.  Reviewed risks and post op restrictions.  Pt desires to proceed.   Almond Lint, MD 09/06/2023, 11:12 AM

## 2023-09-06 NOTE — Op Note (Signed)
  PRE-OPERATIVE DIAGNOSIS:  un-needed Port-A-Cath for left breast cancer  POST-OPERATIVE DIAGNOSIS:  Same   PROCEDURE:  Procedure(s):  REMOVAL PORT-A-CATH  SURGEON:  Surgeon(s):  Almond Lint, MD  ANESTHESIA:   MAC + local  EBL:   Minimal  SPECIMEN:  None  Complications : none known  Procedure:   Pt was  identified in the holding area and taken to the operating room where she was placed supine on the operating room table.  MAC anesthesia was induced.  The left upper chest was prepped and draped.  The prior incision was anesthetized with local anesthetic.  The incision was opened with a #15 blade.  The subcutaneous tissue was divided with the cautery.  The port was identified and the capsule opened.  The four 2-0 prolene sutures were removed.  The port was then removed and pressure held on the tract.  The catheter appeared intact without evidence of breakage, length was 23.5 cm.  The wound was inspected for hemostasis, which was achieved with cautery.  The wound was closed with 3-0 vicryl deep dermal interrupted sutures and 4-0 Monocryl running subcuticular suture.  The wound was cleaned, dried, and dressed with dermabond.  The patient was awakened from anesthesia and taken to the PACU in stable condition.  Needle, sponge, and instrument counts are correct.

## 2023-09-07 ENCOUNTER — Inpatient Hospital Stay: Payer: BC Managed Care – PPO | Attending: Adult Health | Admitting: Adult Health

## 2023-09-07 ENCOUNTER — Encounter (HOSPITAL_COMMUNITY): Payer: Self-pay | Admitting: General Surgery

## 2023-09-07 DIAGNOSIS — Z171 Estrogen receptor negative status [ER-]: Secondary | ICD-10-CM

## 2023-09-07 DIAGNOSIS — C50212 Malignant neoplasm of upper-inner quadrant of left female breast: Secondary | ICD-10-CM

## 2023-09-07 NOTE — Progress Notes (Signed)
[facility] Cancer Follow up:    Sydney Mires, MD [address] [place] [address]   DIAGNOSIS:  Cancer Staging  Malignant neoplasm of upper-inner quadrant of left breast in female, estrogen receptor negative (HCC) Staging form: Breast, AJCC 8th Edition - Clinical: Stage IIB (cT2, cN0, cM0, G3, ER-, PR-, HER2-) - Signed by Rachel Moulds, MD on 05/25/2022 Stage prefix: Initial diagnosis Histologic grading system: 3 grade system  I connected with Sydney Lee on 09/11/23 at 10:45 AM EDT by telephone and verified that I am speaking with the correct person using two identifiers.  I discussed the limitations, risks, security and privacy concerns of performing an evaluation and management service by telephone and the availability of in person appointments.  I also discussed with the patient that there may be a patient responsible charge related to this service. The patient expressed understanding and agreed to proceed.   SUMMARY OF ONCOLOGIC HISTORY: Oncology History  Malignant neoplasm of upper-inner quadrant of left breast in female, estrogen receptor negative (HCC)  04/27/2022 Mammogram   Diagnostic mammogram showed indeterminate solid mass in the 10:00 location of the left breast.  Small satellite nodule adjacent to the index mass.  Borderline lymph node has cortical thickening of 2.9 mm.  Other lymph nodes have normal morphology.   05/16/2022 Pathology Results   Left breast needle core biopsy showed invasive poorly differentiated adenocarcinoma, grade 3 with tumor necrosis, lymph node biopsy benign reactive, negative for carcinoma.  Prognostics from the tumor showed ER 0%, negative, PR 0%, negative, Ki-67 of 80% and HER2 negative   05/25/2022 Cancer Staging   Staging form: Breast, AJCC 8th Edition - Clinical: Stage IIB (cT2, cN0, cM0, G3, ER-, PR-, HER2-) - Signed by Rachel Moulds, MD on 05/25/2022 Stage prefix: Initial diagnosis Histologic grading system: 3 grade  system   05/25/2022 Genetic Testing   [facility] CancerNext-Expanded Panel was Negative. Report date was 06/02/2022.  The CancerNext-Expanded gene panel offered by [facility] and includes sequencing, rearrangement, and RNA analysis for the following 77 genes: AIP, ALK, APC, ATM, AXIN2, BAP1, BARD1, BLM, BMPR1A, BRCA1, BRCA2, BRIP1, CDC73, CDH1, CDK4, CDKN1B, CDKN2A, CHEK2, CTNNA1, DICER1, FANCC, FH, FLCN, GALNT12, KIF1B, LZTR1, MAX, MEN1, MET, MLH1, MSH2, MSH3, MSH6, MUTYH, NBN, NF1, NF2, NTHL1, PALB2, PHOX2B, PMS2, POT1, PRKAR1A, PTCH1, PTEN, RAD51C, RAD51D, RB1, RECQL, RET, SDHA, SDHAF2, SDHB, SDHC, SDHD, SMAD4, SMARCA4, SMARCB1, SMARCE1, STK11, SUFU, TMEM127, TP53, TSC1, TSC2, VHL and XRCC2 (sequencing and deletion/duplication); EGFR, EGLN1, HOXB13, KIT, MITF, PDGFRA, POLD1, and POLE (sequencing only); EPCAM and GREM1 (deletion/duplication only).    06/06/2022 - 10/07/2022 Chemotherapy   Patient is on Treatment Plan : BREAST Pembrolizumab (200) D1 + Carboplatin (5) D1 + Paclitaxel (80) D1,8,15 q21d X 4 cycles / Pembrolizumab (200) D1 + AC D1 q21d x 4 cycles     12/15/2022 Definitive Surgery   She had left breast lumpectomy on January 11 which showed no residual carcinoma in situ or invasive carcinoma identified ypT0 all margins negative.  Sentinel lymph nodes 5 out of 5 without any involvement for carcinoma.   02/07/2023 - 03/07/2023 Radiation Therapy   Plan Name: Breast_L_BH Site: Breast, Left Technique: 3D Mode: Photon Dose Per Fraction: 2.67 Gy Prescribed Dose (Delivered / Prescribed): 40.05 Gy / 40.05 Gy Prescribed Fxs (Delivered / Prescribed): 15 / 15   Plan Name: Brst_L_Bst_BH Site: Breast, Left Technique: 3D Mode: Photon Dose Per Fraction: 2 Gy Prescribed Dose (Delivered / Prescribed): 10 Gy / 10 Gy Prescribed Fxs (  Delivered / Prescribed): 5 / 5     CURRENT THERAPY: observation  INTERVAL HISTORY: Sydney Lee 62 y.o. female returns for f/u to discuss her CT scan results.  Please  note we obtained CT chest to follow-up on her October 2023 CT scan that demonstrated left lower lobe pneumonia and repeat CT was recommended as follow-up to ensure complete regression of findings.  Her scan results were read on September 01, 2023 and demonstrated no findings of metastatic disease, resolved left lower lobe pneumonia, postoperative seroma in the left breast, aortic atherosclerosis, possible constipation.  Sydney Lee tells me that she underwent port removal yesterday.  She is slightly sore, but tells me she is recovering well.     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   Fatigue due to treatment 10/13/2022   Malnutrition of moderate degree 09/17/2022   Severe sepsis with acute organ dysfunction (HCC) 09/15/2022   HTN (hypertension) 09/15/2022   Diabetes mellitus, type II (HCC) 09/15/2022   Anxiety 09/15/2022   Depression 09/15/2022   Port-A-Cath in place 06/06/2022   Infusion reaction 06/06/2022   Genetic testing 06/03/2022   Family history of breast cancer 05/25/2022   Family history of ovarian cancer 05/25/2022   Malignant neoplasm of upper-inner quadrant of left breast in female, estrogen receptor negative (HCC) 05/23/2022    is allergic to [facility] [aprepitant], codeine, and latex.  MEDICAL HISTORY: Past Medical History:  Diagnosis Date   Anxiety    Arthritis    Breast cancer (HCC)    Cardiomyopathy (HCC) 10/2022   likley chemotherapy/adriamycin induced cardiomyopathy   Depression    Diabetes mellitus without complication (HCC)    type 2   History of radiation therapy    Left breast- 02/07/23-03/07/23- Dr. Antony Blackbird   Hypertension    Neuromuscular disorder [facility])    neuropathy hands/feets   Personal history of chemotherapy    Personal history of radiation therapy    Pneumonia     SURGICAL HISTORY: Past Surgical History:   Procedure Laterality Date   BREAST BIOPSY  12/13/2022   MM LT RADIOACTIVE SEED LOC MAMMO GUIDE 12/13/2022 [facility] MAMMOGRAPHY   BREAST LUMPECTOMY Left 12/2022   BREAST LUMPECTOMY WITH RADIOACTIVE SEED AND SENTINEL LYMPH NODE BIOPSY Left 12/15/2022   Procedure: LEFT BREAST LUMPECTOMY WITH RADIOACTIVE SEED AND SENTINEL LYMPH NODE BIOPSY;  Surgeon: Almond Lint, MD;  Location: [facility] OR;  Service: General;  Laterality: Left;   PORT-A-CATH REMOVAL Left 09/06/2023   Procedure: REMOVAL PORT-A-CATH;  Surgeon: Almond Lint, MD;  Location: [facility];  Service: General;  Laterality: Left;   PORTACATH PLACEMENT N/A 06/01/2022   Procedure: INSERTION PORT-A-CATH WITH ULTRASOUND GUIDANCE;  Surgeon: Almond Lint, MD;  Location: [facility];  Service: General;  Laterality: N/A;    SOCIAL HISTORY: Social History   Socioeconomic History   Marital status: Married    Spouse name: Not on file   Number of children: Not on file   Years of education: Not on file   Highest education level: Not on file  Occupational History   Not on file  Tobacco Use   Smoking status: Former    Current packs/day: 0.00    Average packs/day: 1 pack/day for 40.0 years (40.0 ttl pk-yrs)    Types: Cigarettes    Start date: 06/05/1982    Quit date: 06/05/2022    Years since quitting: 1.2   Smokeless tobacco: Never  Vaping Use  Vaping status: Never Used  Substance and Sexual Activity   Alcohol use: No   Drug use: No   Sexual activity: Not on file  Other Topics Concern   Not on file  Social History Narrative   Not on file   Social Determinants of Health   Financial Resource Strain: High Risk (06/14/2022)   Overall Financial Resource Strain (CARDIA)    Difficulty of Paying Living Expenses: Hard  Food Insecurity: No Food Insecurity (01/16/2023)   Hunger Vital Sign    Worried About Running Out of Food in the Last Year: Never true    Ran Out of Food in the Last Year: Never true  Transportation Needs: No Transportation Needs  (01/16/2023)   PRAPARE - Administrator, Civil Service (Medical): No    Lack of Transportation (Non-Medical): No  Physical Activity: Not on file  Stress: Not on file  Social Connections: Not on file  Intimate Partner Violence: Not At Risk (01/16/2023)   Humiliation, Afraid, Rape, and Kick questionnaire    Fear of Current or Ex-Partner: No    Emotionally Abused: No    Physically Abused: No    Sexually Abused: No    FAMILY HISTORY: Family History  Problem Relation Age of Onset   Leukemia Mother 45   Throat cancer Maternal Uncle 20   Prostate cancer Maternal Uncle    Breast cancer Cousin 69       maternal first cousin   Ovarian cancer Cousin        maternal first cousin   Breast cancer Cousin 39 - 71       paternal first cousin   Breast cancer Cousin 42 - 81       paternal first cousin    Review of Systems  Constitutional:  Negative for appetite change, chills, fatigue, fever and unexpected weight change.  HENT:   Negative for hearing loss, lump/mass and trouble swallowing.   Eyes:  Negative for eye problems and icterus.  Respiratory:  Negative for chest tightness, cough and shortness of breath.   Cardiovascular:  Negative for chest pain, leg swelling and palpitations.  Gastrointestinal:  Negative for abdominal distention, abdominal pain, constipation, diarrhea, nausea and vomiting.  Endocrine: Negative for hot flashes.  Genitourinary:  Negative for difficulty urinating.   Musculoskeletal:  Negative for arthralgias.  Skin:  Negative for itching and rash.  Neurological:  Negative for dizziness, extremity weakness, headaches and numbness.  Hematological:  Negative for adenopathy. Does not bruise/bleed easily.  Psychiatric/Behavioral:  Negative for depression. The patient is not nervous/anxious.       PHYSICAL EXAMINATION  Patient sounds well.  She is in no apparent distress.  Mood and behavior are normal.  Speech is normal.  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  Stage IIb triple negative breast cancer: I reviewed Sydney Lee scans with her which demonstrate no signs of metastatic disease.  This is good news.  She will continue with annual mammograms next due in July 2025. Recent port removal: She is healing well from this.  She will follow-up with Dr. Lucius Conn in several weeks. Sydney Lee will return in March 2025 for follow-up with Dr. Al Pimple.   Follow up instructions:    -Return to cancer center 02/2024 for f/u  with Dr. Al Pimple  -Mammogram due in 06/2024   The patient was provided an opportunity to ask questions and all were answered. The patient agreed with the plan and demonstrated an understanding of the instructions.   The patient was advised to call back or seek an in-person evaluation if the symptoms worsen or if the condition fails to improve as anticipated.   I provided 15 minutes of non face-to-face telephone visit time during this encounter, and > 50% was spent counseling as documented under my assessment & plan.   Lillard Anes, NP 09/11/23 8:50 AM Medical Oncology and Hematology [facility] [address] [place], [place] [address] Tel. [phone]    Fax. [phone]  *Total Encounter Time as defined by the Centers for Medicare and Medicaid Services includes, in addition to the face-to-face time of a patient visit (documented in the note above) non-face-to-face time: obtaining and reviewing outside history, ordering and reviewing medications, tests or procedures, care coordination (communications with other health care professionals or caregivers) and documentation in the medical record.

## 2023-09-11 ENCOUNTER — Encounter: Payer: Self-pay | Admitting: Hematology and Oncology

## 2023-09-11 ENCOUNTER — Ambulatory Visit: Payer: BC Managed Care – PPO | Admitting: Hematology and Oncology

## 2023-09-11 NOTE — Assessment & Plan Note (Signed)
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  Stage IIb triple negative breast cancer: I reviewed Sydney Lee scans with her which demonstrate no signs of metastatic disease.  This is good news.  She will continue with annual mammograms next due in July 2025. Recent port removal: She is healing well from this.  She will follow-up with Dr. Lucius Conn in several weeks. Sydney Lee will return in March 2025 for follow-up with Dr. Al Pimple.

## 2023-10-06 ENCOUNTER — Other Ambulatory Visit: Payer: Self-pay | Admitting: *Deleted

## 2023-10-06 MED ORDER — DULOXETINE HCL 30 MG PO CPEP: 60.0000 mg | ORAL_CAPSULE | Freq: Every day | ORAL | 6 refills | Status: DC

## 2023-10-20 ENCOUNTER — Other Ambulatory Visit: Payer: Self-pay | Admitting: Family Medicine

## 2023-10-20 ENCOUNTER — Ambulatory Visit
Admission: RE | Admit: 2023-10-20 | Discharge: 2023-10-20 | Disposition: A | Discharge status: Home / Self Care | Payer: BC Managed Care – PPO | Source: Ambulatory Visit | Attending: Family Medicine | Admitting: Family Medicine

## 2023-10-20 DIAGNOSIS — M25511 Pain in right shoulder: Secondary | ICD-10-CM

## 2023-10-20 DIAGNOSIS — M25561 Pain in right knee: Secondary | ICD-10-CM

## 2023-12-04 ENCOUNTER — Encounter: Payer: Self-pay | Admitting: Hematology and Oncology

## 2024-02-12 ENCOUNTER — Ambulatory Visit: Payer: BC Managed Care – PPO | Admitting: Hematology and Oncology

## 2024-02-15 ENCOUNTER — Encounter: Payer: Self-pay | Admitting: Hematology and Oncology

## 2024-02-15 ENCOUNTER — Inpatient Hospital Stay: Payer: Self-pay | Attending: Hematology and Oncology | Admitting: Hematology and Oncology

## 2024-02-15 VITALS — BP 120/70 | HR 81 | Temp 97.2°F | Resp 17 | Wt 219.5 lb

## 2024-02-15 DIAGNOSIS — Z171 Estrogen receptor negative status [ER-]: Secondary | ICD-10-CM | POA: Diagnosis not present

## 2024-02-15 DIAGNOSIS — Z923 Personal history of irradiation: Secondary | ICD-10-CM | POA: Diagnosis not present

## 2024-02-15 DIAGNOSIS — Z87891 Personal history of nicotine dependence: Secondary | ICD-10-CM | POA: Diagnosis not present

## 2024-02-15 DIAGNOSIS — Z853 Personal history of malignant neoplasm of breast: Secondary | ICD-10-CM | POA: Diagnosis present

## 2024-02-15 DIAGNOSIS — C50212 Malignant neoplasm of upper-inner quadrant of left female breast: Secondary | ICD-10-CM | POA: Diagnosis not present

## 2024-02-15 DIAGNOSIS — Z9221 Personal history of antineoplastic chemotherapy: Secondary | ICD-10-CM | POA: Insufficient documentation

## 2024-02-15 NOTE — Progress Notes (Signed)
 [facility] Cancer Follow up:    Sydney Mires, MD [address] [place] [address]   DIAGNOSIS:  Cancer Staging  Malignant neoplasm of upper-inner quadrant of left breast in female, estrogen receptor negative (HCC) Staging form: Breast, AJCC 8th Edition - Clinical: Stage IIB (cT2, cN0, cM0, G3, ER-, PR-, HER2-) - Signed by Sydney Moulds, MD on 05/25/2022 Stage prefix: Initial diagnosis Histologic grading system: 3 grade system   SUMMARY OF ONCOLOGIC HISTORY: Oncology History  Malignant neoplasm of upper-inner quadrant of left breast in female, estrogen receptor negative (HCC)  04/27/2022 Mammogram   Diagnostic mammogram showed indeterminate solid mass in the 10:00 location of the left breast.  Small satellite nodule adjacent to the index mass.  Borderline lymph node has cortical thickening of 2.9 mm.  Other lymph nodes have normal morphology.   05/16/2022 Pathology Results   Left breast needle core biopsy showed invasive poorly differentiated adenocarcinoma, grade 3 with tumor necrosis, lymph node biopsy benign reactive, negative for carcinoma.  Prognostics from the tumor showed ER 0%, negative, PR 0%, negative, Ki-67 of 80% and HER2 negative   05/25/2022 Cancer Staging   Staging form: Breast, AJCC 8th Edition - Clinical: Stage IIB (cT2, cN0, cM0, G3, ER-, PR-, HER2-) - Signed by Sydney Moulds, MD on 05/25/2022 Stage prefix: Initial diagnosis Histologic grading system: 3 grade system   05/25/2022 Genetic Testing   [facility] CancerNext-Expanded Panel was Negative. Report date was 06/02/2022.  The CancerNext-Expanded gene panel offered by [facility] and includes sequencing, rearrangement, and RNA analysis for the following 77 genes: AIP, ALK, APC, ATM, AXIN2, BAP1, BARD1, BLM, BMPR1A, BRCA1, BRCA2, BRIP1, CDC73, CDH1, CDK4, CDKN1B, CDKN2A, CHEK2, CTNNA1, DICER1, FANCC, FH, FLCN, GALNT12, KIF1B, LZTR1, MAX, MEN1, MET, MLH1, MSH2, MSH3, MSH6, MUTYH, NBN, NF1, NF2,  NTHL1, PALB2, PHOX2B, PMS2, POT1, PRKAR1A, PTCH1, PTEN, RAD51C, RAD51D, RB1, RECQL, RET, SDHA, SDHAF2, SDHB, SDHC, SDHD, SMAD4, SMARCA4, SMARCB1, SMARCE1, STK11, SUFU, TMEM127, TP53, TSC1, TSC2, VHL and XRCC2 (sequencing and deletion/duplication); EGFR, EGLN1, HOXB13, KIT, MITF, PDGFRA, POLD1, and POLE (sequencing only); EPCAM and GREM1 (deletion/duplication only).    06/06/2022 - 10/07/2022 Chemotherapy   Patient is on Treatment Plan : BREAST Pembrolizumab (200) D1 + Carboplatin (5) D1 + Paclitaxel (80) D1,8,15 q21d X 4 cycles / Pembrolizumab (200) D1 + AC D1 q21d x 4 cycles     12/15/2022 Definitive Surgery   She had left breast lumpectomy on January 11 which showed no residual carcinoma in situ or invasive carcinoma identified ypT0 all margins negative.  Sentinel lymph nodes 5 out of 5 without any involvement for carcinoma.   02/07/2023 - 03/07/2023 Radiation Therapy   Plan Name: Breast_L_BH Site: Breast, Left Technique: 3D Mode: Photon Dose Per Fraction: 2.67 Gy Prescribed Dose (Delivered / Prescribed): 40.05 Gy / 40.05 Gy Prescribed Fxs (Delivered / Prescribed): 15 / 15   Plan Name: Brst_L_Bst_BH Site: Breast, Left Technique: 3D Mode: Photon Dose Per Fraction: 2 Gy Prescribed Dose (Delivered / Prescribed): 10 Gy / 10 Gy Prescribed Fxs (Delivered / Prescribed): 5 / 5     CURRENT THERAPY: observation  INTERVAL HISTORY: Sydney Lee 63 y.o. female returns for f/u.  Discussed the use of AI scribe software for clinical note transcription with the patient, who gave verbal consent to proceed.  History of Present Illness    The patient, with a history of breast cancer, presents for follow-up after treatment.  She is doing well post-treatment with no current symptoms suggestive of recurrence. The surgical  site remains sore following her surgery. She experienced a cold two months ago, which improved with treatment from her primary doctor. No current nausea, vomiting, or fever.  She has  regained her appetite and is eating well. She is working part-time and is trying to build up strength in her legs, as she still experiences significant weakness. She has been attending physical therapy twice a week for the past month, which she finds helpful.  Her blood pressure and blood sugar levels are stable. She takes metoprolol daily and losartan, with instructions to skip losartan if her blood pressure is too low, such as 90/60 mmHg. She also takes Jardiance as part of her medication regimen.  Socially, she lost her husband in July and lives with her daughter and grandchild. 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 She is cautious about leaving the house and continues to wear a mask and sanitize regularly.   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   Fatigue due to treatment 10/13/2022   Malnutrition of moderate degree 09/17/2022   Severe sepsis with acute organ dysfunction (HCC) 09/15/2022   HTN (hypertension) 09/15/2022   Diabetes mellitus, type II (HCC) 09/15/2022   Anxiety 09/15/2022   Depression 09/15/2022   Port-A-Cath in place 06/06/2022   Infusion reaction 06/06/2022   Genetic testing 06/03/2022   Family history of breast cancer 05/25/2022   Family history of ovarian cancer 05/25/2022   Malignant neoplasm of upper-inner quadrant of left breast in female, estrogen receptor negative (HCC) 05/23/2022    is allergic to [facility] [aprepitant], codeine, and latex.  MEDICAL HISTORY: Past Medical History:  Diagnosis Date   Anxiety    Arthritis    Breast cancer (HCC)    Cardiomyopathy (HCC) 10/2022   likley chemotherapy/adriamycin induced cardiomyopathy   Depression    Diabetes mellitus without complication (HCC)    type 2   History of radiation therapy    Left breast- 02/07/23-03/07/23- Dr. Antony Lee   Hypertension     Neuromuscular disorder [facility])    neuropathy hands/feets   Personal history of chemotherapy    Personal history of radiation therapy    Pneumonia     SURGICAL HISTORY: Past Surgical History:  Procedure Laterality Date   BREAST BIOPSY  12/13/2022   MM LT RADIOACTIVE SEED LOC MAMMO GUIDE 12/13/2022 [facility] MAMMOGRAPHY   BREAST LUMPECTOMY Left 12/2022   BREAST LUMPECTOMY WITH RADIOACTIVE SEED AND SENTINEL LYMPH NODE BIOPSY Left 12/15/2022   Procedure: LEFT BREAST LUMPECTOMY WITH RADIOACTIVE SEED AND SENTINEL LYMPH NODE BIOPSY;  Surgeon: Almond Lint, MD;  Location: [facility] OR;  Service: General;  Laterality: Left;   PORT-A-CATH REMOVAL Left 09/06/2023   Procedure: REMOVAL PORT-A-CATH;  Surgeon: Almond Lint, MD;  Location: [facility] ORS;  Service: General;  Laterality: Left;   PORTACATH PLACEMENT N/A 06/01/2022   Procedure: INSERTION PORT-A-CATH WITH ULTRASOUND GUIDANCE;  Surgeon: Almond Lint, MD;  Location: [facility] ORS;  Service: General;  Laterality: N/A;    SOCIAL HISTORY: Social History   Socioeconomic History   Marital status: Married    Spouse name: Not on file   Number of children: Not on file   Years of education: Not on file   Highest education level: Not on file  Occupational History   Not on file  Tobacco Use   Smoking status: Former    Current packs/day: 0.00    Average packs/day: 1 pack/day for 40.0  years (40.0 ttl pk-yrs)    Types: Cigarettes    Start date: 06/05/1982    Quit date: 06/05/2022    Years since quitting: 1.6   Smokeless tobacco: Never  Vaping Use   Vaping status: Never Used  Substance and Sexual Activity   Alcohol use: No   Drug use: No   Sexual activity: Not on file  Other Topics Concern   Not on file  Social History Narrative   Not on file   Social Drivers of Health   Financial Resource Strain: High Risk (06/14/2022)   Overall Financial Resource Strain (CARDIA)    Difficulty of Paying Living Expenses: Hard  Food Insecurity: No Food Insecurity (01/16/2023)    Hunger Vital Sign    Worried About Running Out of Food in the Last Year: Never true    Ran Out of Food in the Last Year: Never true  Transportation Needs: No Transportation Needs (01/16/2023)   PRAPARE - Administrator, Civil Service (Medical): No    Lack of Transportation (Non-Medical): No  Physical Activity: Not on file  Stress: Not on file  Social Connections: Not on file  Intimate Partner Violence: Not At Risk (01/16/2023)   Humiliation, Afraid, Rape, and Kick questionnaire    Fear of Current or Ex-Partner: No    Emotionally Abused: No    Physically Abused: No    Sexually Abused: No    FAMILY HISTORY: Family History  Problem Relation Age of Onset   Leukemia Mother 22   Throat cancer Maternal Uncle 29   Prostate cancer Maternal Uncle    Breast cancer Cousin 109       maternal first cousin   Ovarian cancer Cousin        maternal first cousin   Breast cancer Cousin 21 - 37       paternal first cousin   Breast cancer Cousin 41 - 72       paternal first cousin    Review of Systems  Constitutional:  Negative for appetite change, chills, fatigue, fever and unexpected weight change.  HENT:   Negative for hearing loss, lump/mass and trouble swallowing.   Eyes:  Negative for eye problems and icterus.  Respiratory:  Negative for chest tightness, cough and shortness of breath.   Cardiovascular:  Negative for chest pain, leg swelling and palpitations.  Gastrointestinal:  Negative for abdominal distention, abdominal pain, constipation, diarrhea, nausea and vomiting.  Endocrine: Negative for hot flashes.  Genitourinary:  Negative for difficulty urinating.   Musculoskeletal:  Negative for arthralgias.  Skin:  Negative for itching and rash.  Neurological:  Negative for dizziness, extremity weakness, headaches and numbness.  Hematological:  Negative for adenopathy. Does not bruise/bleed easily.  Psychiatric/Behavioral:  Negative for depression. The patient is not  nervous/anxious.       PHYSICAL EXAMINATION  Physical Exam Constitutional:      Appearance: Normal appearance.  Cardiovascular:     Rate and Rhythm: Normal rate and regular rhythm.     Pulses: Normal pulses.     Heart sounds: Normal heart sounds.  Pulmonary:     Effort: Pulmonary effort is normal.     Breath sounds: Normal breath sounds.  Chest:     Comments: Left breast with post surgical changes. Otherwise bilateral breasts with no evidence of recurrence. No regional adenopathy Abdominal:     General: Abdomen is flat.     Palpations: Abdomen is soft.  Musculoskeletal:        General: Normal  range of motion.     Cervical back: Normal range of motion and neck supple. No rigidity.  Lymphadenopathy:     Cervical: No cervical adenopathy.  Skin:    General: Skin is warm and dry.  Neurological:     General: No focal deficit present.     Mental Status: She is alert.  Psychiatric:        Mood and Affect: Mood normal.       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  This is a very pleasant 63 year old female patient with newly diagnosed self detected left breast mass status post imaging and biopsy with pathology showing invasive poorly differentiated adenocarcinoma, grade 3, triple negative, high proliferation index of 80% presented in the breast MDC for additional recommendations.   Given triple negative tumor larger than 2 cm at diagnosis, we have discussed about considering neoadjuvant chemotherapy.   She completed neoadjuvant portion of CarboTaxol Keytruda followed by 1 cycle of AC with Keytruda.   She then complained of persistent fatigue and hence we have ordered a repeat echo and some other  workup which showed a significant drop in her ejection fraction.  She was thought to have cardiac toxicity secondary to anthracyclines versus Keytruda and hence referred to cardiac oncology.   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   After much discussion and reconsultation with Dr. Gala Romney from cardiac oncology, given complete pathologic response and concern for further cardiac toxicity we have made a decision to eliminate adjuvant immunotherapy. She then completed adj radiation and is now here for surveillance.  PLAN  Breast cancer surveillance Asymptomatic post-treatment, no recurrence. Due for mammogram in July. Consider Guardant reveal for MRD detection every six months. - Schedule mammogram in July. - FU in 1 yr, alternate between [place] and Dr Donell Beers.  Leg weakness Persistent leg weakness improving with physical therapy. - Continue physical therapy sessions twice a week.  Hypertension Blood pressure well-controlled with metoprolol and losartan.  Continue to FU with PCP and cardiology  Follow-up Scheduled cardiologist follow-up in March. Awaiting breast surgeon appointment in fall.    *Total Encounter Time as defined by the Centers for Medicare and Medicaid Services includes, in addition to the face-to-face time of a patient visit (documented in the note above) non-face-to-face time: obtaining and reviewing outside history, ordering and reviewing medications, tests or procedures, care coordination (communications with other health care professionals or caregivers) and documentation in the medical record.

## 2024-02-15 NOTE — Assessment & Plan Note (Addendum)
 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  This is a very pleasant 63 year old female patient with newly diagnosed self detected left breast mass status post imaging and biopsy with pathology showing invasive poorly differentiated adenocarcinoma, grade 3, triple negative, high proliferation index of 80% presented in the breast MDC for additional recommendations.   Given triple negative tumor larger than 2 cm at diagnosis, we have discussed about considering neoadjuvant chemotherapy.   She completed neoadjuvant portion of CarboTaxol Keytruda followed by 1 cycle of AC with Keytruda.   She then complained of persistent fatigue and hence we have ordered a repeat echo and some other workup which showed a significant drop in her ejection fraction.  She was thought to have cardiac toxicity secondary to anthracyclines versus Keytruda and hence referred to cardiac oncology.   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   After much discussion and reconsultation with Dr. Gala Romney from cardiac oncology, given complete pathologic response and concern for further cardiac toxicity we have made a decision to eliminate adjuvant immunotherapy. She then completed adj radiation and is now here for surveillance.  PLAN  Breast cancer surveillance Asymptomatic post-treatment, no recurrence. Due for mammogram in July. Consider Guardant reveal for MRD detection every six months. - Schedule mammogram in July. - FU in 1 yr, alternate between [place] and Dr Donell Beers.  Leg weakness Persistent leg weakness improving with physical therapy. - Continue physical therapy sessions twice a week.  Hypertension Blood pressure well-controlled with metoprolol and losartan.  Continue to FU with PCP and cardiology  Follow-up Scheduled cardiologist  follow-up in March. Awaiting breast surgeon appointment in fall.

## 2024-02-28 ENCOUNTER — Encounter: Payer: Self-pay | Admitting: Hematology and Oncology

## 2024-02-29 ENCOUNTER — Ambulatory Visit: Payer: Self-pay | Admitting: Podiatry

## 2024-03-01 ENCOUNTER — Ambulatory Visit: Payer: Self-pay | Admitting: Podiatry

## 2024-03-01 ENCOUNTER — Encounter: Payer: Self-pay | Admitting: Podiatry

## 2024-03-01 DIAGNOSIS — M79672 Pain in left foot: Secondary | ICD-10-CM

## 2024-03-01 DIAGNOSIS — G629 Polyneuropathy, unspecified: Secondary | ICD-10-CM | POA: Diagnosis not present

## 2024-03-01 DIAGNOSIS — L603 Nail dystrophy: Secondary | ICD-10-CM

## 2024-03-01 DIAGNOSIS — M79671 Pain in right foot: Secondary | ICD-10-CM

## 2024-03-01 DIAGNOSIS — G8929 Other chronic pain: Secondary | ICD-10-CM

## 2024-03-01 NOTE — Patient Instructions (Signed)

## 2024-03-04 NOTE — Progress Notes (Signed)
  Subjective:  Patient ID: Sydney Lee, female    DOB: June 22, 1961,  MRN: [id number]  Chief Complaint  Patient presents with   Diabetes    Diabetic foot exam - last A1c was 6.8, concerned about fungus in hallux nail left-PCP had her buy an OTC antifungal - hasn't helped, neuropathy-takes cymbalta 60mg  daily-not helping   New Patient (Initial Visit)    Discussed the use of AI scribe software for clinical note transcription with the patient, who gave verbal consent to proceed.  History of Present Illness The patient, with a history of diabetes and cancer treatment, presents with concerns about her feet. She reports a sensation of fungus and neuropathy, despite using topical treatments. The patient has been managing her symptoms as best she can, but notes that her feet do not feel normal. She has been using Cymbalta for neuropathy, but reports no significant improvement.  She is also been on gabapentin previously.  The patient also mentions that she has been undergoing physical therapy to regain strength after cancer treatment. She notes a noticeable improvement in her strength, although she is not yet back to her pre-treatment level of fitness. The patient also reports that her feet do not hurt unless they are touched or hit, and she has no history of wounds or fluid drainage from her feet.      Objective:    Physical Exam General: AAO x3, NAD  Dermatological:, Brown discoloration.  Nails particularly hallux are hypertrophic, dystrophic with there is no edema, erythema or signs of infection.  No open lesions.  Vascular: Dorsalis Pedis artery and Posterior Tibial artery pedal pulses are 2/4 bilateral with immedate capillary fill time.  There is no pain with calf compression, swelling, warmth, erythema.   Neruologic: Grossly intact via light touch bilateral.  Sensation intact with Semmes Weinstein monofilament.  Musculoskeletal: There is no area pinpoint tenderness identified otherwise.   Flexor, extensor tendons appear to be intact.  Gait: Unassisted, Nonantalgic.     No images are attached to the encounter.    Results    Assessment:   1. Nail dystrophy   2. Neuropathy      Plan:  Patient was evaluated and treated and all questions answered.  Assessment and Plan Assessment & Plan Peripheral Neuropathy Likely secondary to diabetes and chemotherapy. Progressive and challenging to treat. Current treatment with Cymbalta 60 mg daily ineffective. Previous gabapentin trial ineffective. Medications address symptoms, not underlying cause. - Refer to [facility] for further evaluation and management.  Onychomycosis Suspected fungal infection. Topical treatments ineffective. Oral antifungals have high success but require blood monitoring due to hepatotoxicity risk. Alternative topical treatments may be more effective for thickened nails. - Culture toenail samples to identify specific fungus and guide treatment.  Sent to [place].  - Await culture results before initiating treatment.  Diabetes Mellitus Complications include peripheral neuropathy and potential onychomycosis. No foot wounds or non-healing ulcers. - Recommend annual podiatry check-ups for diabetic foot care.  No follow-ups on file.   Vivi Barrack DPM

## 2024-03-14 ENCOUNTER — Encounter: Payer: Self-pay | Admitting: Physical Medicine and Rehabilitation

## 2024-03-15 ENCOUNTER — Telehealth: Payer: Self-pay | Admitting: Hematology and Oncology

## 2024-03-15 DIAGNOSIS — Z171 Estrogen receptor negative status [ER-]: Secondary | ICD-10-CM

## 2024-03-15 NOTE — Telephone Encounter (Signed)
 Guardant reveal showed ct DNA detected, I called the patient and discussed this She is ok with systemic scans for further eval  Rykar Lebleu

## 2024-03-15 NOTE — Telephone Encounter (Signed)
 Spoke with patient confirming upcoming appointment

## 2024-03-18 ENCOUNTER — Other Ambulatory Visit: Payer: Self-pay | Admitting: Podiatry

## 2024-03-20 ENCOUNTER — Telehealth: Payer: Self-pay | Admitting: Hematology and Oncology

## 2024-03-20 NOTE — Telephone Encounter (Signed)
 Confirmed with pt scheduled office visit to telephone

## 2024-03-25 ENCOUNTER — Encounter: Payer: Self-pay | Admitting: Podiatry

## 2024-03-29 ENCOUNTER — Ambulatory Visit (HOSPITAL_COMMUNITY)
Admission: RE | Admit: 2024-03-29 | Discharge: 2024-03-29 | Disposition: A | Discharge status: Home / Self Care | Source: Ambulatory Visit | Attending: Hematology and Oncology | Admitting: Hematology and Oncology

## 2024-03-29 ENCOUNTER — Encounter (HOSPITAL_COMMUNITY)
Admission: RE | Admit: 2024-03-29 | Discharge: 2024-03-29 | Disposition: A | Discharge status: Home / Self Care | Source: Ambulatory Visit | Attending: Hematology and Oncology | Admitting: Hematology and Oncology

## 2024-03-29 DIAGNOSIS — Z171 Estrogen receptor negative status [ER-]: Secondary | ICD-10-CM | POA: Insufficient documentation

## 2024-03-29 DIAGNOSIS — C50212 Malignant neoplasm of upper-inner quadrant of left female breast: Secondary | ICD-10-CM | POA: Diagnosis present

## 2024-03-29 LAB — POCT I-STAT CREATININE: Creatinine, Ser: 0.8 mg/dL (ref 0.44–1.00)

## 2024-03-29 MED ORDER — SODIUM CHLORIDE (PF) 0.9 % IJ SOLN
INTRAMUSCULAR | Status: AC
Start: 2024-03-29 — End: ?
  Filled 2024-03-29: qty 50

## 2024-03-29 MED ORDER — IOHEXOL 300 MG/ML  SOLN
100.0000 mL | Freq: Once | INTRAMUSCULAR | Status: AC | PRN
  Administered 2024-03-29: 100 mL via INTRAVENOUS

## 2024-04-10 ENCOUNTER — Telehealth: Payer: Self-pay | Admitting: Hematology and Oncology

## 2024-04-10 NOTE — Telephone Encounter (Signed)
 I tried calling [name] to review imaging results. No CT or bone scan evidence of metastatic disease. Unchanged nodule of posterior RUL, likely benign We will try to repeat guardant reveal in 3 months and consider reimaging if necessary Patient continues to assure me that she is doing really well and she doesn't think we will find anything in future scans. I certainly hope this is the case.  Sydney Lee

## 2024-04-11 ENCOUNTER — Telehealth: Admitting: Hematology and Oncology

## 2024-04-16 ENCOUNTER — Encounter: Payer: Self-pay | Admitting: Hematology and Oncology

## 2024-04-24 ENCOUNTER — Other Ambulatory Visit (HOSPITAL_COMMUNITY): Payer: Self-pay | Admitting: Cardiology

## 2024-04-24 MED ORDER — METOPROLOL SUCCINATE ER 50 MG PO TB24: 50.0000 mg | ORAL_TABLET | Freq: Every day | ORAL | 5 refills | Status: DC

## 2024-05-07 ENCOUNTER — Encounter: Payer: Self-pay | Admitting: Hematology and Oncology

## 2024-05-09 ENCOUNTER — Encounter: Admitting: Physical Medicine and Rehabilitation

## 2024-06-17 ENCOUNTER — Encounter (HOSPITAL_COMMUNITY): Payer: Self-pay | Admitting: Internal Medicine

## 2024-06-17 ENCOUNTER — Ambulatory Visit (HOSPITAL_COMMUNITY)
Admission: RE | Admit: 2024-06-17 | Discharge: 2024-06-17 | Disposition: A | Discharge status: Home / Self Care | Source: Ambulatory Visit | Attending: Internal Medicine | Admitting: Internal Medicine

## 2024-06-17 VITALS — BP 118/80 | HR 106 | Ht 69.0 in | Wt 225.0 lb

## 2024-06-17 DIAGNOSIS — I10 Essential (primary) hypertension: Secondary | ICD-10-CM | POA: Diagnosis not present

## 2024-06-17 DIAGNOSIS — Z7984 Long term (current) use of oral hypoglycemic drugs: Secondary | ICD-10-CM | POA: Insufficient documentation

## 2024-06-17 DIAGNOSIS — I427 Cardiomyopathy due to drug and external agent: Secondary | ICD-10-CM | POA: Diagnosis present

## 2024-06-17 DIAGNOSIS — I959 Hypotension, unspecified: Secondary | ICD-10-CM | POA: Diagnosis not present

## 2024-06-17 DIAGNOSIS — E119 Type 2 diabetes mellitus without complications: Secondary | ICD-10-CM | POA: Diagnosis not present

## 2024-06-17 DIAGNOSIS — C50212 Malignant neoplasm of upper-inner quadrant of left female breast: Secondary | ICD-10-CM | POA: Diagnosis not present

## 2024-06-17 DIAGNOSIS — Z7985 Long-term (current) use of injectable non-insulin antidiabetic drugs: Secondary | ICD-10-CM | POA: Insufficient documentation

## 2024-06-17 DIAGNOSIS — Z853 Personal history of malignant neoplasm of breast: Secondary | ICD-10-CM | POA: Diagnosis not present

## 2024-06-17 DIAGNOSIS — Z79899 Other long term (current) drug therapy: Secondary | ICD-10-CM | POA: Insufficient documentation

## 2024-06-17 DIAGNOSIS — Z87891 Personal history of nicotine dependence: Secondary | ICD-10-CM | POA: Diagnosis not present

## 2024-06-17 DIAGNOSIS — T451X5A Adverse effect of antineoplastic and immunosuppressive drugs, initial encounter: Secondary | ICD-10-CM | POA: Insufficient documentation

## 2024-06-17 DIAGNOSIS — Z5986 Financial insecurity: Secondary | ICD-10-CM | POA: Insufficient documentation

## 2024-06-17 DIAGNOSIS — Z171 Estrogen receptor negative status [ER-]: Secondary | ICD-10-CM | POA: Diagnosis not present

## 2024-06-17 DIAGNOSIS — Z923 Personal history of irradiation: Secondary | ICD-10-CM | POA: Diagnosis not present

## 2024-06-17 MED ORDER — LOSARTAN POTASSIUM 25 MG PO TABS
12.5000 mg | ORAL_TABLET | Freq: Every day | ORAL | 6 refills | Status: AC
Start: 2024-06-17 — End: ?

## 2024-06-17 NOTE — Patient Instructions (Signed)
 Medication Changes:  START Losartan  12.5 mg (1/2 tab) Daily at bedtime  Testing/Procedures:  Your physician has requested that you have an echocardiogram. Echocardiography is a painless test that uses sound waves to create images of your heart. It provides your doctor with information about the size and shape of your heart and how well your heart's chambers and valves are working. This procedure takes approximately one hour. There are no restrictions for this procedure. Please do NOT wear cologne, perfume, aftershave, or lotions (deodorant is allowed). Please arrive 15 minutes prior to your appointment time.  Please note: We ask at that you not bring children with you during ultrasound (echo/ vascular) testing. Due to room size and safety concerns, children are not allowed in the ultrasound rooms during exams. Our front office staff cannot provide observation of children in our lobby area while testing is being conducted. An adult accompanying a patient to their appointment will only be allowed in the ultrasound room at the discretion of the ultrasound technician under special circumstances. We apologize for any inconvenience.   Special Instructions // Education:  Do the following things EVERYDAY: Weigh yourself in the morning before breakfast. Write it down and keep it in a log. Take your medicines as prescribed Eat low salt foods--Limit salt (sodium) to 2000 mg per day.  Stay as active as you can everyday Limit all fluids for the day to less than 2 liters   Follow-Up in: 4 months   At the [facility], you and your health needs are our priority. We have a designated team specialized in the treatment of Heart Failure. This Care Team includes your primary Heart Failure Specialized Cardiologist (physician), Advanced Practice Providers (APPs- Physician Assistants and Nurse Practitioners), and Pharmacist who all work together to provide you with the care you need, when you  need it.   You may see any of the following providers on your designated Care Team at your next follow up:  Dr. Toribio Fuel Dr. Ezra Shuck Dr. Ria Commander Dr. Odis Brownie Greig Mosses, NP [place]affie Shed, [place] [facility] [place], [place] Beckey Coe, NP [place] Lee, NP Tinnie Redman, PharmD   Please be sure to bring in all your medications bottles to every appointment.   Need to Contact Us :  If you have any questions or concerns before your next appointment please send us  a message through [place] or call our office at [phone].    TO LEAVE A MESSAGE FOR THE NURSE SELECT OPTION 2, PLEASE LEAVE A MESSAGE INCLUDING: YOUR NAME DATE OF BIRTH CALL BACK NUMBER REASON FOR CALL**this is important as we prioritize the call backs  YOU WILL RECEIVE A CALL BACK THE SAME DAY AS LONG AS YOU CALL BEFORE 4:00 PM

## 2024-06-17 NOTE — Addendum Note (Signed)
 Encounter addended by: Buell Powell HERO, RN on: 06/17/2024 3:11 PM  Actions taken: Order list changed, Diagnosis association updated, Clinical Note Signed

## 2024-06-17 NOTE — Progress Notes (Addendum)
 CARDIO-ONCOLOGY CLINIC  NOTE  Referring Physician: Dr. Loretha Primary Care: [name], MD Primary Cardiologist: New  HPI:  Ms Sydney Lee is a 63 y.o. female with DM2, HTN, former smoker, left breast cancer referred by Dr. Loretha for enrollment into the Cardio-Oncology program.  Diagnosed in 5/23. Stage IIB triple negative. Ki-67 80%  7/3- 10/07/22 treated with Pembrolizumab  (200) D1 + Carboplatin  (5) D1 + Paclitaxel  (80) D1,8,15 q21d X 4 cycles  followed by Pembrolizumab  (200) D1 + AC D1 q21d x 4 cycles (received 2 cycles of AC including adriamycin )   After cycle 5 D1, she was admitted (09/15/22-09/21/22) with streptococcal pneumonia infection/sepsis and was on antibiotics, required transfusion, felt severe fatigue, debilitated after.    Chest CT 09/17/22: No coronary calcifications  Denies any h/o known heart disease. Had DM2 and HTN but well controlled.  Worked as Merchandiser, retail at [employer]   I saw her for first visit in 12/23 due to decreased EF. cMRI ordered.   cMRI 12/23: LVEF 49% RVEF 49% No LGE. Diffuse elevation in extracellular volume percentage at 37%. This suggests diffusely increased myocardial fibrosis. Lack of LGE and ECV< 40% make cardiac amyloidosis unlikely. -> D/w Dr. Loretha. - Given evidence of probable previous myocarditis decision made toavoid further Keytruda  and adriamycin  and proceed with adjuvant XRT and not immunotherapy   Here for f/u. Finished XRT in 1/25  ECHO 06/02/22 EF 55-60% GLS -22.4% ECHO: 10/11/22  read as 30-35%. I reviewed echo and feel like EF closer to 40-45%  Echo 4/24 EF 50-55%  Here for f/u. 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 No CP or SOB. No edema. Losartan  stopped by PCP due to SBP in 90s. Now SBP running 110-115. HgBa1c 6.5    Past Medical History:  Diagnosis Date   Anxiety    Arthritis    Breast cancer (HCC)    Cardiomyopathy (HCC) 10/2022   likley chemotherapy/adriamycin  induced cardiomyopathy   Depression    Diabetes mellitus  without complication (HCC)    type 2   History of radiation therapy    Left breast- 02/07/23-03/07/23- Dr. Lynwood Nasuti   Hypertension    Neuromuscular disorder [facility])    neuropathy hands/feets   Personal history of chemotherapy    Personal history of radiation therapy    Pneumonia     Current Outpatient Medications  Medication Sig Dispense Refill   acetaminophen  (TYLENOL ) 500 MG tablet Take 1,000 mg by mouth every 6 (six) hours as needed for moderate pain.     ALPRAZolam  (XANAX ) 0.25 MG tablet Take 1 tablet (0.25 mg total) by mouth 2 (two) times daily as needed for anxiety. 30 tablet 0   atorvastatin  (LIPITOR) 10 MG tablet Take 10 mg by mouth every Monday, Wednesday, and Friday.     DULoxetine  (CYMBALTA ) 30 MG capsule Take 2 capsules (60 mg total) by mouth daily. 60 capsule 6   JARDIANCE 10 MG TABS tablet Take 10 mg by mouth in the morning.     loratadine (CLARITIN) 10 MG tablet Take 10 mg by mouth daily as needed for allergies or itching.     meclizine  (ANTIVERT ) 25 MG tablet Take 25 mg by mouth 3 (three) times daily as needed for dizziness.     metFORMIN (GLUCOPHAGE) 1000 MG tablet Take 1,000 mg by mouth daily with breakfast.     metoprolol  succinate (TOPROL -XL) 50 MG 24 hr tablet Take 1 tablet (50 mg total) by mouth daily. Take with or immediately following a meal. 30 tablet 5  OZEMPIC, 1 MG/DOSE, 4 MG/3ML SOPN Inject 1 mg into the skin every Wednesday.     Vitamin D, Ergocalciferol, (DRISDOL) 1.25 MG (50000 UNIT) CAPS capsule Take 50,000 Units by mouth once a week.     No current facility-administered medications for this encounter.    Allergies  Allergen Reactions   Emend [Aprepitant] Shortness Of Breath and Other (See Comments)    Flushing and shortness of breath    Codeine Nausea Only and Other (See Comments)    Sick on the stomach   Latex Itching and Other (See Comments)    Gloves=itching/burning      Social History   Socioeconomic History   Marital status:  Married    Spouse name: Not on file   Number of children: Not on file   Years of education: Not on file   Highest education level: Not on file  Occupational History   Not on file  Tobacco Use   Smoking status: Former    Current packs/day: 0.00    Average packs/day: 1 pack/day for 40.0 years (40.0 ttl pk-yrs)    Types: Cigarettes    Start date: 06/05/1982    Quit date: 06/05/2022    Years since quitting: 2.0   Smokeless tobacco: Never  Vaping Use   Vaping status: Never Used  Substance and Sexual Activity   Alcohol use: No   Drug use: No   Sexual activity: Not on file  Other Topics Concern   Not on file  Social History Narrative   Not on file   Social Drivers of Health   Financial Resource Strain: High Risk (06/14/2022)   Overall Financial Resource Strain (CARDIA)    Difficulty of Paying Living Expenses: Hard  Food Insecurity: No Food Insecurity (01/16/2023)   Hunger Vital Sign    Worried About Running Out of Food in the Last Year: Never true    Ran Out of Food in the Last Year: Never true  Transportation Needs: No Transportation Needs (01/16/2023)   PRAPARE - Administrator, Civil Service (Medical): No    Lack of Transportation (Non-Medical): No  Physical Activity: Not on file  Stress: Not on file  Social Connections: Not on file  Intimate Partner Violence: Not At Risk (01/16/2023)   Humiliation, Afraid, Rape, and Kick questionnaire    Fear of Current or Ex-Partner: No    Emotionally Abused: No    Physically Abused: No    Sexually Abused: No      Family History  Problem Relation Age of Onset   Leukemia Mother 14   Throat cancer Maternal Uncle 85   Prostate cancer Maternal Uncle    Breast cancer Cousin 20       maternal first cousin   Ovarian cancer Cousin        maternal first cousin   Breast cancer Cousin 110 - 73       paternal first cousin   Breast cancer Cousin 40 - 74       paternal first cousin    Vitals:   06/17/24 1444  BP: 118/80  Pulse:  (!) 106  SpO2: 98%  Weight: 102.1 kg (225 lb)  Height: 5' 9 (1.753 m)     Wt Readings from Last 3 Encounters:  06/17/24 102.1 kg (225 lb)  02/15/24 99.6 kg (219 lb 8 oz)  09/06/23 89.8 kg (198 lb)    PHYSICAL EXAM: General:  Well appearing. No resp difficulty HEENT: normal Neck: supple. no JVD. Carotids 2+ bilat;  no bruits. No lymphadenopathy or thryomegaly appreciated. Cor: PMI nondisplaced. Regular rate & rhythm. No rubs, gallops or murmurs. Lungs: clear Abdomen: soft, nontender, nondistended. No hepatosplenomegaly. No bruits or masses. Good bowel sounds. Extremities: no cyanosis, clubbing, rash, edema Neuro: alert & orientedx3, cranial nerves grossly intact. moves all 4 extremities w/o difficulty. Affect pleasant  ECG sinus tachy 106 No ST-T wave abnormalities.   ASSESSMENT & PLAN:  1. Left Breast Cancer - 7/3- 10/07/22 treated with Pembrolizumab  (200) D1 + Carboplatin  (5) D1 + Paclitaxel  (80) D1,8,15 q21d X 4 cycles  followed by Pembrolizumab  (200) D1 + AC D1 q21d x 4 cycles (received 2 cycles of AC including adriamycin ) - s/p XRT - Has completed therapy.   2. Chemotherapy-induced cardiomyopathy - ECHO 06/02/22 EF 55-60% GLS -22.4% - ECHO11/7/23  read as 30-35%. I reviewed echo and feel like EF closer to 40-45% - cMRI 12/23: LVEF 49% RVEF 49% No LGE. Diffuse elevation in extracellular volume percentage at 37%. This suggests diffusely increased myocardial fibrosis. Lack of LGE and ECV< 40% make cardiac amyloidosis unlikely. ? 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 Will rechallenge with losartan  12.5 mg at bedtime  - Continue Toprol  50 - BP too low for spiro  - Given evidence of probable previous myocarditis would avoid further Keytruda  and adriamycin  if at all possible.  - EF now normal by echo in 4/24. Will repeat echo to ensure  stability    3. DM2 - on Ozempic and Jardiance - Hgba1c 6.5% - per PCP    Toribio Fuel, MD  2:44 PM

## 2024-06-18 ENCOUNTER — Ambulatory Visit
Admission: RE | Admit: 2024-06-18 | Discharge: 2024-06-18 | Disposition: A | Discharge status: Home / Self Care | Source: Ambulatory Visit | Attending: Hematology and Oncology | Admitting: Hematology and Oncology

## 2024-06-18 DIAGNOSIS — Z171 Estrogen receptor negative status [ER-]: Secondary | ICD-10-CM

## 2024-06-21 ENCOUNTER — Encounter: Payer: Self-pay | Admitting: Hematology and Oncology

## 2024-07-02 ENCOUNTER — Other Ambulatory Visit: Payer: Self-pay | Admitting: *Deleted

## 2024-07-02 MED ORDER — DULOXETINE HCL 30 MG PO CPEP: 60.0000 mg | ORAL_CAPSULE | Freq: Every day | ORAL | 6 refills | Status: AC

## 2024-07-03 ENCOUNTER — Ambulatory Visit (HOSPITAL_COMMUNITY)
Admission: RE | Admit: 2024-07-03 | Discharge: 2024-07-03 | Disposition: A | Discharge status: Home / Self Care | Source: Ambulatory Visit | Attending: *Deleted | Admitting: *Deleted

## 2024-07-03 DIAGNOSIS — I071 Rheumatic tricuspid insufficiency: Secondary | ICD-10-CM | POA: Insufficient documentation

## 2024-07-03 DIAGNOSIS — T451X5A Adverse effect of antineoplastic and immunosuppressive drugs, initial encounter: Secondary | ICD-10-CM | POA: Diagnosis not present

## 2024-07-03 DIAGNOSIS — I427 Cardiomyopathy due to drug and external agent: Secondary | ICD-10-CM | POA: Insufficient documentation

## 2024-07-03 DIAGNOSIS — T451X5D Adverse effect of antineoplastic and immunosuppressive drugs, subsequent encounter: Secondary | ICD-10-CM

## 2024-07-03 DIAGNOSIS — I509 Heart failure, unspecified: Secondary | ICD-10-CM | POA: Diagnosis not present

## 2024-07-03 DIAGNOSIS — I110 Hypertensive heart disease with heart failure: Secondary | ICD-10-CM | POA: Insufficient documentation

## 2024-07-03 LAB — ECHOCARDIOGRAM COMPLETE
Calc EF: 51.5 %
S' Lateral: 2.8 cm
Single Plane A2C EF: 47.7 %
Single Plane A4C EF: 52.9 %

## 2024-08-19 ENCOUNTER — Inpatient Hospital Stay: Attending: Hematology and Oncology | Admitting: Hematology and Oncology

## 2024-08-19 VITALS — BP 127/71 | HR 88 | Temp 98.1°F | Resp 22 | Wt 231.0 lb

## 2024-08-19 DIAGNOSIS — C50212 Malignant neoplasm of upper-inner quadrant of left female breast: Secondary | ICD-10-CM | POA: Diagnosis not present

## 2024-08-19 DIAGNOSIS — Z853 Personal history of malignant neoplasm of breast: Secondary | ICD-10-CM | POA: Diagnosis present

## 2024-08-19 DIAGNOSIS — Z171 Estrogen receptor negative status [ER-]: Secondary | ICD-10-CM | POA: Diagnosis not present

## 2024-08-19 DIAGNOSIS — T451X5A Adverse effect of antineoplastic and immunosuppressive drugs, initial encounter: Secondary | ICD-10-CM | POA: Diagnosis not present

## 2024-08-19 DIAGNOSIS — R5383 Other fatigue: Secondary | ICD-10-CM | POA: Insufficient documentation

## 2024-08-19 DIAGNOSIS — G620 Drug-induced polyneuropathy: Secondary | ICD-10-CM | POA: Diagnosis not present

## 2024-08-19 NOTE — Progress Notes (Signed)
 [facility] Cancer Follow up:    Sydney Lung, MD [address] [place] [address]   DIAGNOSIS:  Cancer Staging  Malignant neoplasm of upper-inner quadrant of left breast in female, estrogen receptor negative (HCC) Staging form: Breast, AJCC 8th Edition - Clinical: Stage IIB (cT2, cN0, cM0, G3, ER-, PR-, HER2-) - Signed by Sydney Ash, MD on 05/25/2022 Stage prefix: Initial diagnosis Histologic grading system: 3 grade system   SUMMARY OF ONCOLOGIC HISTORY: Oncology History  Malignant neoplasm of upper-inner quadrant of left breast in female, estrogen receptor negative (HCC)  04/27/2022 Mammogram   Diagnostic mammogram showed indeterminate solid mass in the 10:00 location of the left breast.  Small satellite nodule adjacent to the index mass.  Borderline lymph node has cortical thickening of 2.9 mm.  Other lymph nodes have normal morphology.   05/16/2022 Pathology Results   Left breast needle core biopsy showed invasive poorly differentiated adenocarcinoma, grade 3 with tumor necrosis, lymph node biopsy benign reactive, negative for carcinoma.  Prognostics from the tumor showed ER 0%, negative, PR 0%, negative, Ki-67 of 80% and HER2 negative   05/25/2022 Cancer Staging   Staging form: Breast, AJCC 8th Edition - Clinical: Stage IIB (cT2, cN0, cM0, G3, ER-, PR-, HER2-) - Signed by Sydney Ash, MD on 05/25/2022 Stage prefix: Initial diagnosis Histologic grading system: 3 grade system   05/25/2022 Genetic Testing   [facility] CancerNext-Expanded Panel was Negative. Report date was 06/02/2022.  The CancerNext-Expanded gene panel offered by [facility] and includes sequencing, rearrangement, and RNA analysis for the following 77 genes: AIP, ALK, APC, ATM, AXIN2, BAP1, BARD1, BLM, BMPR1A, BRCA1, BRCA2, BRIP1, CDC73, CDH1, CDK4, CDKN1B, CDKN2A, CHEK2, CTNNA1, DICER1, FANCC, FH, FLCN, GALNT12, KIF1B, LZTR1, MAX, MEN1, MET, MLH1, MSH2, MSH3, MSH6, MUTYH, NBN, NF1, NF2,  NTHL1, PALB2, PHOX2B, PMS2, POT1, PRKAR1A, PTCH1, PTEN, RAD51C, RAD51D, RB1, RECQL, RET, SDHA, SDHAF2, SDHB, SDHC, SDHD, SMAD4, SMARCA4, SMARCB1, SMARCE1, STK11, SUFU, TMEM127, TP53, TSC1, TSC2, VHL and XRCC2 (sequencing and deletion/duplication); EGFR, EGLN1, HOXB13, KIT, MITF, PDGFRA, POLD1, and POLE (sequencing only); EPCAM and GREM1 (deletion/duplication only).    06/06/2022 - 10/07/2022 Chemotherapy   Patient is on Treatment Plan : BREAST Pembrolizumab  (200) D1 + Carboplatin  (5) D1 + Paclitaxel  (80) D1,8,15 q21d X 4 cycles / Pembrolizumab  (200) D1 + AC D1 q21d x 4 cycles     12/15/2022 Definitive Surgery   She had left breast lumpectomy on January 11 which showed no residual carcinoma in situ or invasive carcinoma identified ypT0 all margins negative.  Sentinel lymph nodes 5 out of 5 without any involvement for carcinoma.   02/07/2023 - 03/07/2023 Radiation Therapy   Plan Name: Breast_L_BH Site: Breast, Left Technique: 3D Mode: Photon Dose Per Fraction: 2.67 Gy Prescribed Dose (Delivered / Prescribed): 40.05 Gy / 40.05 Gy Prescribed Fxs (Delivered / Prescribed): 15 / 15   Plan Name: Brst_L_Bst_BH Site: Breast, Left Technique: 3D Mode: Photon Dose Per Fraction: 2 Gy Prescribed Dose (Delivered / Prescribed): 10 Gy / 10 Gy Prescribed Fxs (Delivered / Prescribed): 5 / 5     CURRENT THERAPY: observation  INTERVAL HISTORY: Sydney Lee 63 y.o. female returns for f/u.  Discussed the use of AI scribe software for clinical note transcription with the patient, who gave verbal consent to proceed.  History of Present Illness    Discussed the use of AI scribe software for clinical note transcription with the patient, who gave verbal consent to proceed.  History of Present Illness Sydney Lee is  a 63 year old female with breast cancer who presents for follow-up after recent negative blood tests and scans.  She has a history of breast cancer, last Guardant reveal in April was positive, which  prompted further scans that did not reveal any cancer. A recent follow-up Guardant reveal has returned negative. She is not currently on any medication for cancer due to previous cardiac complications from treatment, which necessitated the discontinuation of certain drugs.  She experiences ongoing neuropathy, which varies in severity, with some mornings being better than others. She has regained weight since her last visit and feels more like herself.  She has experienced significant personal loss, including the death of her husband, who passed away peacefully after struggling with the loss of his leg and subsequent depression. She also lost her mother and father in recent years. Despite these challenges, she finds joy in her interactions with her three-year-old granddaughter, who often calls her on Facetime.  Rest of the pertinent 10 point ROS reviewed and neg.  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   Secondary seroma of breast 01/02/2023   Fatigue due to treatment 10/13/2022   Malnutrition of moderate degree 09/17/2022   Severe sepsis with acute organ dysfunction (HCC) 09/15/2022   HTN (hypertension) 09/15/2022   Diabetes mellitus, type II (HCC) 09/15/2022   Anxiety 09/15/2022   Depression 09/15/2022   Port-A-Cath in place 06/06/2022   Infusion reaction 06/06/2022   Genetic testing 06/03/2022   Family history of breast cancer 05/25/2022   Family history of ovarian cancer 05/25/2022   Malignant neoplasm of upper-inner quadrant of left breast in female, estrogen receptor negative (HCC) 05/23/2022    is allergic to [facility] [aprepitant], codeine, and latex.  MEDICAL HISTORY: Past Medical History:  Diagnosis Date   Anxiety    Arthritis    Breast cancer (HCC)    Cardiomyopathy (HCC) 10/2022   likley chemotherapy/adriamycin  induced cardiomyopathy    Depression    Diabetes mellitus without complication (HCC)    type 2   History of radiation therapy    Left breast- 02/07/23-03/07/23- Dr. Lynwood Lee   Hypertension    Neuromuscular disorder [facility])    neuropathy hands/feets   Personal history of chemotherapy    Personal history of radiation therapy    Pneumonia     SURGICAL HISTORY: Past Surgical History:  Procedure Laterality Date   BREAST BIOPSY  12/13/2022   MM LT RADIOACTIVE SEED LOC MAMMO GUIDE 12/13/2022 [facility] MAMMOGRAPHY   BREAST LUMPECTOMY Left 12/2022   BREAST LUMPECTOMY WITH RADIOACTIVE SEED AND SENTINEL LYMPH NODE BIOPSY Left 12/15/2022   Procedure: LEFT BREAST LUMPECTOMY WITH RADIOACTIVE SEED AND SENTINEL LYMPH NODE BIOPSY;  Surgeon: Aron Shoulders, MD;  Location: [facility] OR;  Service: General;  Laterality: Left;   PORT-A-CATH REMOVAL Left 09/06/2023   Procedure: REMOVAL PORT-A-CATH;  Surgeon: Aron Shoulders, MD;  Location: [facility] ORS;  Service: General;  Laterality: Left;   PORTACATH PLACEMENT N/A 06/01/2022   Procedure: INSERTION PORT-A-CATH WITH ULTRASOUND GUIDANCE;  Surgeon: Aron Shoulders, MD;  Location: [facility] ORS;  Service: General;  Laterality: N/A;    SOCIAL HISTORY: Social History   Socioeconomic History   Marital status: Married    Spouse name: Not on file   Number of children: Not on file   Years of education: Not on file   Highest education level: Not on file  Occupational History   Not on file  Tobacco  Use   Smoking status: Former    Current packs/day: 0.00    Average packs/day: 1 pack/day for 40.0 years (40.0 ttl pk-yrs)    Types: Cigarettes    Start date: 06/05/1982    Quit date: 06/05/2022    Years since quitting: 2.2   Smokeless tobacco: Never  Vaping Use   Vaping status: Never Used  Substance and Sexual Activity   Alcohol use: No   Drug use: No   Sexual activity: Not on file  Other Topics Concern   Not on file  Social History Narrative   Not on file   Social Drivers of Health   Financial Resource  Strain: High Risk (06/14/2022)   Overall Financial Resource Strain (CARDIA)    Difficulty of Paying Living Expenses: Hard  Food Insecurity: No Food Insecurity (01/16/2023)   Hunger Vital Sign    Worried About Running Out of Food in the Last Year: Never true    Ran Out of Food in the Last Year: Never true  Transportation Needs: No Transportation Needs (01/16/2023)   PRAPARE - Administrator, Civil Service (Medical): No    Lack of Transportation (Non-Medical): No  Physical Activity: Not on file  Stress: Not on file  Social Connections: Not on file  Intimate Partner Violence: Not At Risk (01/16/2023)   Humiliation, Afraid, Rape, and Kick questionnaire    Fear of Current or Ex-Partner: No    Emotionally Abused: No    Physically Abused: No    Sexually Abused: No    FAMILY HISTORY: Family History  Problem Relation Age of Onset   Leukemia Mother 44   Throat cancer Maternal Uncle 78   Prostate cancer Maternal Uncle    Breast cancer Cousin 37       maternal first cousin   Ovarian cancer Cousin        maternal first cousin   Breast cancer Cousin 56 - 77       paternal first cousin   Breast cancer Cousin 57 - 49       paternal first cousin    Review of Systems  Constitutional:  Negative for appetite change, chills, fatigue, fever and unexpected weight change.  HENT:   Negative for hearing loss, lump/mass and trouble swallowing.   Eyes:  Negative for eye problems and icterus.  Respiratory:  Negative for chest tightness, cough and shortness of breath.   Cardiovascular:  Negative for chest pain, leg swelling and palpitations.  Gastrointestinal:  Negative for abdominal distention, abdominal pain, constipation, diarrhea, nausea and vomiting.  Endocrine: Negative for hot flashes.  Genitourinary:  Negative for difficulty urinating.   Musculoskeletal:  Negative for arthralgias.  Skin:  Negative for itching and rash.  Neurological:  Negative for dizziness, extremity weakness,  headaches and numbness.  Hematological:  Negative for adenopathy. Does not bruise/bleed easily.  Psychiatric/Behavioral:  Negative for depression. The patient is not nervous/anxious.       PHYSICAL EXAMINATION  Physical Exam Constitutional:      Appearance: Normal appearance.  Cardiovascular:     Rate and Rhythm: Normal rate and regular rhythm.     Pulses: Normal pulses.     Heart sounds: Normal heart sounds.  Pulmonary:     Effort: Pulmonary effort is normal.     Breath sounds: Normal breath sounds.  Chest:     Comments: Left breast with post surgical changes. Otherwise bilateral breasts with no evidence of recurrence. No regional adenopathy Abdominal:     General: Abdomen  is flat.     Palpations: Abdomen is soft.  Musculoskeletal:        General: Normal range of motion.     Cervical back: Normal range of motion and neck supple. No rigidity.  Lymphadenopathy:     Cervical: No cervical adenopathy.  Skin:    General: Skin is warm and dry.  Neurological:     General: No focal deficit present.     Mental Status: She is alert.  Psychiatric:        Mood and Affect: Mood normal.     ASSESSMENT and THERAPY PLAN:   Malignant neoplasm of upper-inner quadrant of left breast in female, estrogen receptor negative (HCC) Pattye Meda is a 63 year old woman with history of stage IIb triple negative breast cancer diagnosed in June 2023 s/p neoadjuvant chemotherapy followed by lumpectomy and adjuvant radiation.  This is a very pleasant 62 year old female patient with newly diagnosed self detected left breast mass status post imaging and biopsy with pathology showing invasive poorly differentiated adenocarcinoma, grade 3, triple negative, high proliferation index of 80% presented in the breast MDC for additional recommendations.   Given triple negative tumor larger than 2 cm at diagnosis, we have discussed about considering neoadjuvant chemotherapy.   She completed neoadjuvant portion of  CarboTaxol Keytruda  followed by 1 cycle of AC with Keytruda .   She then complained of persistent fatigue and hence we have ordered a repeat echo and some other workup which showed a significant drop in her ejection fraction.  She was thought to have cardiac toxicity secondary to anthracyclines versus Keytruda  and hence referred to cardiac oncology.   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 After much discussion and reconsultation with Dr. Bensimhon from cardiac oncology, given complete pathologic response and concern for further cardiac toxicity we have made a decision to eliminate adjuvant immunotherapy. She then completed adj radiation and is now here for surveillance.    Assessment and Plan Assessment & Plan Surveillance for personal history of left breast cancer Recent imaging and blood tests show no detectable cancer. - Continue regular monitoring with mammograms and blood tests.  Chemotherapy-induced peripheral neuropathy Ongoing peripheral neuropathy due to chemotherapy. 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   Time spent: 30 min *Total Encounter Time as defined by the Centers for Medicare and Medicaid Services includes, in addition to the face-to-face time of a patient visit (documented in the note above) non-face-to-face time: obtaining and reviewing outside history, ordering and reviewing medications, tests or procedures, care coordination (communications with other health care professionals or caregivers) and documentation in the medical record.

## 2024-08-19 NOTE — Assessment & Plan Note (Addendum)
 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  This is a very pleasant 63 year old female patient with newly diagnosed self detected left breast mass status post imaging and biopsy with pathology showing invasive poorly differentiated adenocarcinoma, grade 3, triple negative, high proliferation index of 80% presented in the breast MDC for additional recommendations.   Given triple negative tumor larger than 2 cm at diagnosis, we have discussed about considering neoadjuvant chemotherapy.   She completed neoadjuvant portion of CarboTaxol Keytruda  followed by 1 cycle of AC with Keytruda .   She then complained of persistent fatigue and hence we have ordered a repeat echo and some other workup which showed a significant drop in her ejection fraction.  She was thought to have cardiac toxicity secondary to anthracyclines versus Keytruda  and hence referred to cardiac oncology.   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 After much discussion and reconsultation with Dr. Bensimhon from cardiac oncology, given complete pathologic response and concern for further cardiac toxicity we have made a decision to eliminate adjuvant immunotherapy. She then completed adj radiation and is now here for surveillance.

## 2024-09-19 ENCOUNTER — Telehealth: Payer: Self-pay | Admitting: *Deleted

## 2024-09-19 NOTE — Telephone Encounter (Signed)
 Called and left message on pt personal cell to make aware that Guardant Reveal results were negative. Advised that if there are questions or concerns to call the office

## 2024-09-23 ENCOUNTER — Encounter: Payer: Self-pay | Admitting: Hematology and Oncology

## 2024-10-12 ENCOUNTER — Other Ambulatory Visit (HOSPITAL_COMMUNITY): Payer: Self-pay | Admitting: Internal Medicine

## 2024-10-14 ENCOUNTER — Telehealth (HOSPITAL_COMMUNITY): Payer: Self-pay

## 2024-10-14 NOTE — Telephone Encounter (Signed)
 Called to confirm/remind patient of [facility] appointment [facility] on 10[facility].   Appointment:   [x] Confirmed  [] Left mess   [] No answer/No voice mail  [] VM Full/unable to leave message  [] Phone not in service  Patient reminded to bring all medications and/or complete list.  Confirmed patient has transportation. Gave directions, instructed to utilize valet parking.

## 2024-10-15 ENCOUNTER — Ambulatory Visit (HOSPITAL_COMMUNITY)
Admission: RE | Admit: 2024-10-15 | Discharge: 2024-10-15 | Disposition: A | Discharge status: Home / Self Care | Source: Ambulatory Visit | Attending: Family Medicine | Admitting: Family Medicine

## 2024-10-15 ENCOUNTER — Encounter (HOSPITAL_COMMUNITY): Payer: Self-pay

## 2024-10-15 VITALS — BP 118/68 | HR 72 | Ht 69.0 in | Wt 228.0 lb

## 2024-10-15 DIAGNOSIS — T451X5A Adverse effect of antineoplastic and immunosuppressive drugs, initial encounter: Secondary | ICD-10-CM | POA: Diagnosis not present

## 2024-10-15 DIAGNOSIS — G620 Drug-induced polyneuropathy: Secondary | ICD-10-CM | POA: Diagnosis not present

## 2024-10-15 DIAGNOSIS — Z853 Personal history of malignant neoplasm of breast: Secondary | ICD-10-CM | POA: Diagnosis not present

## 2024-10-15 DIAGNOSIS — Z7985 Long-term (current) use of injectable non-insulin antidiabetic drugs: Secondary | ICD-10-CM | POA: Insufficient documentation

## 2024-10-15 DIAGNOSIS — Z79899 Other long term (current) drug therapy: Secondary | ICD-10-CM | POA: Diagnosis not present

## 2024-10-15 DIAGNOSIS — Z171 Estrogen receptor negative status [ER-]: Secondary | ICD-10-CM

## 2024-10-15 DIAGNOSIS — I427 Cardiomyopathy due to drug and external agent: Secondary | ICD-10-CM | POA: Insufficient documentation

## 2024-10-15 DIAGNOSIS — Z7984 Long term (current) use of oral hypoglycemic drugs: Secondary | ICD-10-CM | POA: Diagnosis not present

## 2024-10-15 DIAGNOSIS — Z923 Personal history of irradiation: Secondary | ICD-10-CM | POA: Diagnosis not present

## 2024-10-15 DIAGNOSIS — C50212 Malignant neoplasm of upper-inner quadrant of left female breast: Secondary | ICD-10-CM

## 2024-10-15 DIAGNOSIS — I10 Essential (primary) hypertension: Secondary | ICD-10-CM | POA: Diagnosis not present

## 2024-10-15 DIAGNOSIS — E1140 Type 2 diabetes mellitus with diabetic neuropathy, unspecified: Secondary | ICD-10-CM | POA: Insufficient documentation

## 2024-10-15 DIAGNOSIS — Z87891 Personal history of nicotine dependence: Secondary | ICD-10-CM | POA: Diagnosis not present

## 2024-10-15 LAB — BASIC METABOLIC PANEL WITH GFR
Anion gap: 10 (ref 5–15)
BUN: 5 mg/dL — ABNORMAL LOW (ref 8–23)
CO2: 26 mmol/L (ref 22–32)
Calcium: 9 mg/dL (ref 8.9–10.3)
Chloride: 103 mmol/L (ref 98–111)
Creatinine, Ser: 0.76 mg/dL (ref 0.44–1.00)
GFR, Estimated: >60 mL/min (ref >60)
Glucose, Bld: 133 mg/dL — ABNORMAL HIGH (ref 70–99)
Potassium: 3.8 mmol/L (ref 3.5–5.1)
Sodium: 139 mmol/L (ref 135–145)

## 2024-10-15 NOTE — Progress Notes (Signed)
 CARDIO-ONCOLOGY CLINIC NOTE  Primary Care: [name], MD Heme/Onc: Dr. Loretha Primary Cardiologist: Dr. Cherrie  HPI:  Sydney Lee is a 63 y.o. female with DM2, HTN, former smoker, left breast cancer referred by Dr. Loretha for enrollment into the Cardio-Oncology program.  Diagnosed in 5/23. Stage IIb triple negative. Ki-67 80%  7/3- 10/07/22 treated with Pembrolizumab  (200) D1 + Carboplatin  (5) D1 + Paclitaxel  (80) D1,8,15 q21d X 4 cycles  followed by Pembrolizumab  (200) D1 + AC D1 q21d x 4 cycles (received 2 cycles of AC including adriamycin )   After cycle 5 D1, she was admitted (09/15/22-09/21/22) with streptococcal pneumonia infection/sepsis and was on antibiotics, required transfusion, felt severe fatigue, debilitated after.    Chest CT 09/17/22: No coronary calcifications.  Denies any h/o known heart disease. Had DM2 and HTN but well controlled.  Worked as merchandiser, retail at [employer].   Initial visit with Dr. Cherrie 12/23 due to decreased EF. cMRI ordered.   cMRI 12/23: LVEF 49% RVEF 49% No LGE. Diffuse elevation in extracellular volume percentage at 37%. This suggests diffusely increased myocardial fibrosis. Lack of LGE and ECV< 40% make cardiac amyloidosis unlikely. -> D/w Dr. Loretha. - Given evidence of probable previous myocarditis, decision made toavoid further Keytruda  and adriamycin  and proceed with adjuvant XRT and not immunotherapy.  Finished XRT in 1/25.  Echo 06/02/22 EF 55-60%, GLS -22.4%. Echo 10/11/22 read as 30-35%. Dr. Cherrie reviewed echo and feel like EF closer to 40-45%.  Echo 4/24 EF 50-55%.  Today she returns for HF follow up. Overall feeling fine. 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 Recently lost husband and several family members. No undue dyspnea with activity. Has a 77 year old grand-daughter she enjoys caring for. Denies palpitations, abnormal bleeding, CP, dizziness, edema, or PND/Orthopnea. Appetite ok. Taking all medications. BP 110s at  home.  Past Medical History:  Diagnosis Date   Anxiety    Arthritis    Breast cancer (HCC)    Cardiomyopathy (HCC) 10/2022   likley chemotherapy/adriamycin  induced cardiomyopathy   Depression    Diabetes mellitus without complication (HCC)    type 2   History of radiation therapy    Left breast- 02/07/23-03/07/23- Dr. Lynwood Nasuti   Hypertension    Neuromuscular disorder [facility])    neuropathy hands/feets   Personal history of chemotherapy    Personal history of radiation therapy    Pneumonia     Current Outpatient Medications  Medication Sig Dispense Refill   acetaminophen  (TYLENOL ) 500 MG tablet Take 1,000 mg by mouth every 6 (six) hours as needed for moderate pain.     ALPRAZolam  (XANAX ) 0.25 MG tablet Take 1 tablet (0.25 mg total) by mouth 2 (two) times daily as needed for anxiety. 30 tablet 0   atorvastatin  (LIPITOR) 20 MG tablet Take 20 mg by mouth at bedtime.     DULoxetine  (CYMBALTA ) 30 MG capsule Take 2 capsules (60 mg total) by mouth daily. 60 capsule 6   JARDIANCE 10 MG TABS tablet Take 10 mg by mouth in the morning.     loratadine (CLARITIN) 10 MG tablet Take 10 mg by mouth daily as needed for allergies or itching.     losartan  (COZAAR ) 25 MG tablet Take 0.5 tablets (12.5 mg total) by mouth at bedtime. 15 tablet 6   meclizine  (ANTIVERT ) 25 MG tablet Take 25 mg by mouth 3 (three) times daily as needed for dizziness.     metFORMIN (GLUCOPHAGE) 1000 MG tablet Take 1,000 mg by mouth  daily with breakfast.     metoprolol  succinate (TOPROL -XL) 50 MG 24 hr tablet TAKE 1 TABLET BY MOUTH DAILY. TAKE WITH OR IMMEDIATELY FOLLOWING A MEAL. 90 tablet 1   OZEMPIC, 1 MG/DOSE, 4 MG/3ML SOPN Inject 1 mg into the skin every Wednesday.     Vitamin D, Ergocalciferol, (DRISDOL) 1.25 MG (50000 UNIT) CAPS capsule Take 50,000 Units by mouth once a week.     No current facility-administered medications for this encounter.    Allergies  Allergen Reactions   Emend [Aprepitant] Shortness Of  Breath and Other (See Comments)    Flushing and shortness of breath    Codeine Nausea Only and Other (See Comments)    Sick on the stomach   Latex Itching and Other (See Comments)    Gloves=itching/burning   Social History   Socioeconomic History   Marital status: Married    Spouse name: Not on file   Number of children: Not on file   Years of education: Not on file   Highest education level: Not on file  Occupational History   Not on file  Tobacco Use   Smoking status: Former    Current packs/day: 0.00    Average packs/day: 1 pack/day for 40.0 years (40.0 ttl pk-yrs)    Types: Cigarettes    Start date: 06/05/1982    Quit date: 06/05/2022    Years since quitting: 2.3   Smokeless tobacco: Never  Vaping Use   Vaping status: Never Used  Substance and Sexual Activity   Alcohol use: No   Drug use: No   Sexual activity: Not on file  Other Topics Concern   Not on file  Social History Narrative   Not on file   Social Drivers of Health   Financial Resource Strain: High Risk (06/14/2022)   Overall Financial Resource Strain (CARDIA)    Difficulty of Paying Living Expenses: Hard  Food Insecurity: No Food Insecurity (01/16/2023)   Hunger Vital Sign    Worried About Running Out of Food in the Last Year: Never true    Ran Out of Food in the Last Year: Never true  Transportation Needs: No Transportation Needs (01/16/2023)   PRAPARE - Administrator, Civil Service (Medical): No    Lack of Transportation (Non-Medical): No  Physical Activity: Not on file  Stress: Not on file  Social Connections: Not on file  Intimate Partner Violence: Not At Risk (01/16/2023)   Humiliation, Afraid, Rape, and Kick questionnaire    Fear of Current or Ex-Partner: No    Emotionally Abused: No    Physically Abused: No    Sexually Abused: No   Family History  Problem Relation Age of Onset   Leukemia Mother 61   Throat cancer Maternal Uncle 89   Prostate cancer Maternal Uncle    Breast  cancer Cousin 83       maternal first cousin   Ovarian cancer Cousin        maternal first cousin   Breast cancer Cousin 11 - 56       paternal first cousin   Breast cancer Cousin 21 - 62       paternal first cousin   Wt Readings from Last 3 Encounters:  10/15/24 103.4 kg (228 lb)  08/19/24 104.8 kg (231 lb)  06/17/24 102.1 kg (225 lb)   BP 118/68   Pulse 72   Ht 5' 9 (1.753 m)   Wt 103.4 kg (228 lb)   SpO2 97%  BMI 33.67 kg/m   PHYSICAL EXAM: General:  NAD. No resp difficulty, walked into clinic HEENT: Normal Neck: Supple. No JVD. Cor: Regular rate & rhythm. No rubs, gallops or murmurs. Lungs: Clear Abdomen: Soft, nontender, nondistended.  Extremities: No cyanosis, clubbing, rash, edema Neuro: Alert & oriented x 3, moves all 4 extremities w/o difficulty. Affect pleasant.  ASSESSMENT & PLAN: 1. Left Breast Cancer - 7/3-11/3/23 treated with Pembrolizumab  (200) D1 + Carboplatin  (5) D1 + Paclitaxel  (80) D1,8,15 q21d X 4 cycles followed by Pembrolizumab  (200) D1 + AC D1 q21d x 4 cycles (received 2 cycles of AC including adriamycin ). - s/p XRT. - Has completed therapy.   2. Chemotherapy-induced cardiomyopathy - Echo 6/23: EF 55-60%, GLS -22.4% - Echo 11/23: read as 30-35%. Dr Cherrie reviewed echo and felt EF closer to 40-45% - cMRI 12/23: LVEF 49%, RVEF 49% No LGE. Diffuse elevation in extracellular volume percentage at 37%. This suggests diffusely increased myocardial fibrosis. Lack of LGE and ECV< 40% make cardiac amyloidosis unlikely. ? 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 - Continue Jardiance 10 mg daily. - Continue losartan  12.5 mg at bedtime.  - Continue Toprol  50 mg daily. - BP too low for spiro.  - Given evidence of probable previous myocarditis, would avoid further Keytruda  and adriamycin  if at all possible.  - Labs  today. - Repeat echo in 6 months to ensure EF stable.  3. DM2 - on Ozempic and Jardiance - Hgba1c 6.5% - per PCP  Follow up in 6 months with Dr. Cherrie + echo.   Harlene CHRISTELLA Gainer, FNP  3:17 PM

## 2024-10-15 NOTE — Patient Instructions (Addendum)
 Good to see you today!  Your physician has requested that you have an echocardiogram. Echocardiography is a painless test that uses sound waves to create images of your heart. It provides your doctor with information about the size and shape of your heart and how well your heart's chambers and valves are working. This procedure takes approximately one hour. There are no restrictions for this procedure. Please do NOT wear cologne, perfume, aftershave, or lotions (deodorant is allowed). Please arrive 15 minutes prior to your appointment time.  Please note: We ask at that you not bring children with you during ultrasound (echo/ vascular) testing. Due to room size and safety concerns, children are not allowed in the ultrasound rooms during exams. Our front office staff cannot provide observation of children in our lobby area while testing is being conducted. An adult accompanying a patient to their appointment will only be allowed in the ultrasound room at the discretion of the ultrasound technician under special circumstances. We apologize for any inconvenience.  Labs done today, your results will be available in MyChart, we will contact you for abnormal readings.  Your physician recommends that you schedule a follow-up appointment 6 months with echocardiogram(May ) Call office in March to schedule an appointment  If you have any questions or concerns before your next appointment please send us  a message through [facility] or call our office at [phone].    TO LEAVE A MESSAGE FOR THE NURSE SELECT OPTION 2, PLEASE LEAVE A MESSAGE INCLUDING: YOUR NAME DATE OF BIRTH CALL BACK NUMBER REASON FOR CALL**this is important as we prioritize the call backs  YOU WILL RECEIVE A CALL BACK THE SAME DAY AS LONG AS YOU CALL BEFORE 4:00 PM At the [facility], you and your health needs are our priority. As part of our continuing mission to provide you with exceptional heart care, we have created  designated Provider Care Teams. These Care Teams include your primary Cardiologist (physician) and Advanced Practice Providers (APPs- Physician Assistants and Nurse Practitioners) who all work together to provide you with the care you need, when you need it.   You may see any of the following providers on your designated Care Team at your next follow up: Dr Toribio Fuel Dr Ezra Shuck Dr. Morene Brownie Greig Mosses, NP Caffie Shed, [place] [facility], [place] Beckey Coe, NP Jordan Lee, NP Ellouise Class, NP Tinnie Redman, PharmD Jaun Bash, PharmD   Please be sure to bring in all your medications bottles to every appointment.    Thank you for choosing  [facility]

## 2024-10-16 ENCOUNTER — Ambulatory Visit (HOSPITAL_COMMUNITY): Payer: Self-pay | Admitting: Family Medicine

## 2024-10-18 ENCOUNTER — Encounter (HOSPITAL_COMMUNITY)

## 2024-11-19 ENCOUNTER — Telehealth: Payer: Self-pay | Admitting: Hematology and Oncology

## 2024-11-19 NOTE — Telephone Encounter (Signed)
 I spoke w patient regarding 02/17/25 appt being rescheduled to 02/18/25. Patient is aware of new appt date and time.

## 2025-02-17 ENCOUNTER — Ambulatory Visit: Admitting: Hematology and Oncology

## 2025-02-18 ENCOUNTER — Inpatient Hospital Stay: Admitting: Hematology and Oncology
